# Patient Record
Sex: Female | Born: 1990 | Race: Black or African American | Hispanic: No | Marital: Married | Smoking: Current every day smoker
Health system: Southern US, Community
[De-identification: ages and names within clinical notes are randomized; demographics above are authoritative.]

## PROBLEM LIST (undated history)

## (undated) DIAGNOSIS — J45909 Unspecified asthma, uncomplicated: Secondary | ICD-10-CM

## (undated) DIAGNOSIS — F419 Anxiety disorder, unspecified: Secondary | ICD-10-CM

## (undated) DIAGNOSIS — F32A Depression, unspecified: Secondary | ICD-10-CM

## (undated) DIAGNOSIS — F319 Bipolar disorder, unspecified: Secondary | ICD-10-CM

## (undated) HISTORY — PX: NO PAST SURGERIES: SHX2092

## (undated) HISTORY — DX: Depression, unspecified: F32.A

---
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
## 2021-04-09 ENCOUNTER — Emergency Department (HOSPITAL_COMMUNITY): Payer: Medicaid - Out of State

## 2021-04-09 ENCOUNTER — Encounter (HOSPITAL_COMMUNITY): Payer: Self-pay | Admitting: *Deleted

## 2021-04-09 ENCOUNTER — Other Ambulatory Visit: Payer: Self-pay

## 2021-04-09 ENCOUNTER — Emergency Department (HOSPITAL_COMMUNITY)
Admission: EM | Admit: 2021-04-09 | Discharge: 2021-04-10 | Disposition: A | Discharge status: Home / Self Care | Payer: Medicaid - Out of State | Attending: Emergency Medicine | Admitting: Emergency Medicine

## 2021-04-09 DIAGNOSIS — M25561 Pain in right knee: Secondary | ICD-10-CM | POA: Diagnosis not present

## 2021-04-09 DIAGNOSIS — Y92002 Bathroom of unspecified non-institutional (private) residence single-family (private) house as the place of occurrence of the external cause: Secondary | ICD-10-CM | POA: Diagnosis not present

## 2021-04-09 DIAGNOSIS — W01198A Fall on same level from slipping, tripping and stumbling with subsequent striking against other object, initial encounter: Secondary | ICD-10-CM | POA: Insufficient documentation

## 2021-04-09 DIAGNOSIS — Y9302 Activity, running: Secondary | ICD-10-CM | POA: Insufficient documentation

## 2021-04-09 DIAGNOSIS — M25461 Effusion, right knee: Secondary | ICD-10-CM | POA: Diagnosis not present

## 2021-04-09 DIAGNOSIS — F17210 Nicotine dependence, cigarettes, uncomplicated: Secondary | ICD-10-CM | POA: Insufficient documentation

## 2021-04-09 DIAGNOSIS — J45909 Unspecified asthma, uncomplicated: Secondary | ICD-10-CM | POA: Insufficient documentation

## 2021-04-09 HISTORY — DX: Unspecified asthma, uncomplicated: J45.909

## 2021-04-09 NOTE — ED Provider Notes (Signed)
Emergency Medicine Provider Triage Evaluation Note  Cheryl Tran , a 30 y.o. female  was evaluated in triage.  Pt complains of knee pain.  Patient states that she was running to the bathroom when she tripped and fell awkwardly.  She felt a pop in her right knee.  She had almost immediate swelling and difficulty bearing weight.  She denies hip or ankle injury.  She did not hit her head or lose consciousness.  No treatments prior to arrival.  Review of Systems  Positive: Knee pain and swelling Negative: Numbness or tingling  Physical Exam  BP 115/79 (BP Location: Left Arm)    Pulse 98    Temp 99.3 F (37.4 C) (Oral)    Resp 16    SpO2 100%  Gen:   Awake, no distress   Resp:  Normal effort  MSK:   Mild swelling about the right knee, tenderness anteriorly and over the proximal tibia, patient is able to slightly extend the leg at the knee but reports significant pain Other:  Distal CMS intact  Medical Decision Making  Medically screening exam initiated at 9:53 PM.  Appropriate orders placed.  Cheryl Tran was informed that the remainder of the evaluation will be completed by another provider, this initial triage assessment does not replace that evaluation, and the importance of remaining in the ED until their evaluation is complete.     Renne Crigler, PA-C 04/09/21 2155    Sloan Leiter, DO 04/09/21 2226

## 2021-04-09 NOTE — ED Triage Notes (Signed)
Pt c/o R knee pain after falling tonight. Able to ambulate with increased pain

## 2021-04-10 NOTE — Discharge Instructions (Addendum)
You were seen in the ER today for your knee pain after your fall.  You did not have any broken bones but you do have some swelling in your knee; you may have strained the ligaments in the knee.  For this reason you have been given the support of knee brace and crutches to use.  You may begin to bear weight on your leg over the next few days as you feel comfortable.  Please call the orthopedic listed below if you for outpatient follow-up.  You may use Tylenol or ibuprofen as needed; you may also use topical pain relief such as Biofreeze or icy hot.  Please ice the area 15 to 20 minutes at a time a few times a day and elevate the knee when you are resting.  Return to the ER with any numbness, tingling, weakness in the leg, or any other new severe symptom.

## 2021-04-10 NOTE — ED Provider Notes (Signed)
[facility] EMERGENCY DEPARTMENT Provider Note   CSN: [id number] Arrival date & time: 04/09/21  2121     History Chief Complaint  Patient presents with   Knee Pain    Cheryl Tran is a 30 y.o. female who presents with concern for right knee pain after falling this evening at home.  She states that she was hurrying to the restroom when she tripped over the raised edge where 2 types of flooring meet in her hallway.  When this happened she fell forward landing directly on her knees on the hard floor.  She denies any numbness, ting, weakness in her leg since her fall.  She does endorse that her right knee feels like it buckles each time she tries to bear weight on it though she states this is contributed to by her pain.  I personally reviewed her medical records.  She does not carry any medical diagnoses besides asthma and is not on any medications daily.  She has recently relocated from [place].   HPI     Past Medical History:  Diagnosis Date   Asthma     There are no problems to display for this patient.   History reviewed. No pertinent surgical history.   OB History   No obstetric history on file.     No family history on file.  Social History   Tobacco Use   Smoking status: Every Day    Types: Cigarettes  Substance Use Topics   Alcohol use: Yes   Drug use: Yes    Types: Marijuana    Home Medications Prior to Admission medications   Not on File    Allergies    Patient has no known allergies.  Review of Systems   Review of Systems  Constitutional: Negative.   HENT: Negative.    Respiratory: Negative.    Cardiovascular: Negative.   Gastrointestinal: Negative.   Genitourinary: Negative.   Musculoskeletal:  Positive for joint swelling.       Right knee pain and swelling after fall  Skin: Negative.   Neurological: Negative.    Physical Exam Updated Vital Signs BP 130/89 (BP Location: Right Arm)    Pulse 89    Temp 99.3 F (37.4 C)  (Oral)    Resp 20    LMP 02/28/2021    SpO2 100%   Physical Exam Vitals and nursing note reviewed.  HENT:     Head: Normocephalic and atraumatic.  Eyes:     General: No scleral icterus.       Right eye: No discharge.        Left eye: No discharge.     Conjunctiva/sclera: Conjunctivae normal.  Cardiovascular:     Pulses:          Dorsalis pedis pulses are 2+ on the right side and 2+ on the left side.  Pulmonary:     Effort: Pulmonary effort is normal.  Musculoskeletal:     Right hip: Normal.     Left hip: Normal.     Right upper leg: Normal.     Left upper leg: Normal.     Right knee: Swelling and bony tenderness present. No deformity, ecchymosis or crepitus. Normal range of motion. Tenderness present over the lateral joint line. No LCL laxity, MCL laxity, ACL laxity or PCL laxity. Normal alignment.     Left knee: Normal.     Right lower leg: Normal.     Left lower leg: Normal.     Right  ankle: Normal.     Right Achilles Tendon: Normal.     Left ankle: Normal.     Left Achilles Tendon: Normal.     Right foot: Normal.     Left foot: Normal.  Skin:    General: Skin is warm and dry.     Capillary Refill: Capillary refill takes less than 2 seconds.  Neurological:     General: No focal deficit present.     Mental Status: She is alert.  Psychiatric:        Mood and Affect: Mood normal.    ED Results / Procedures / Treatments   Labs (all labs ordered are listed, but only abnormal results are displayed) Labs Reviewed - No data to display  EKG None  Radiology DG Knee Complete 4 Views Right  Result Date: 04/09/2021 CLINICAL DATA:  Status post fall. EXAM: RIGHT KNEE - COMPLETE 4+ VIEW COMPARISON:  None. FINDINGS: No evidence of an acute fracture or dislocation. No evidence of arthropathy or other focal bone abnormality. A moderate sized joint effusion is noted. IMPRESSION: 1. No acute fracture or dislocation. 2. Moderate sized joint effusion. Electronically Signed   By:  Aram Candela M.D.   On: 04/09/2021 22:48    Procedures Procedures   Medications Ordered in ED Medications - No data to display  ED Course  I have reviewed the triage vital signs and the nursing notes.  Pertinent labs & imaging results that were available during my care of the patient were reviewed by me and considered in my medical decision making (see chart for details).    MDM Rules/Calculators/A&P                         30 year old female presents with concern for knee pain after mechanical fall this evening.  Diagnosis includes was not limited to acute fracture or dislocation, effusion, soft tissue injury, ligamentous injury.  Medicines are normal in intake.  There is no laxity of the ACL/PCL/MCL/LCL on exam the patient does have tenderness palpation over the lateral joint line and some discomfort with valgus movement of the knee.  Normal distal pulses in bilateral lower extremities.  Will place patient in a supportive knee brace and provide crutches.  She may be weightbearing as tolerated until she follows up with orthopedic in the outpatient setting.  No further work-up warranted in the ER at this time.  Toya voiced understanding of her medical evaluation and treatment plan.  Each of her questions was answered to her expressed satisfaction.  Return precautions were given.  Patient is well-appearing, stable, and appropriate for discharge at this time.  This chart was dictated using voice recognition software, Dragon. Despite the best efforts of this provider to proofread and correct errors, errors may still occur which can change documentation meaning.  Final Clinical Impression(s) / ED Diagnoses Final diagnoses:  Acute pain of right knee    Rx / DC Orders ED Discharge Orders     None        Sherrilee Gilles 04/10/21 0319    Dione Booze, MD 04/10/21 [phone]

## 2021-05-29 ENCOUNTER — Other Ambulatory Visit: Payer: Self-pay

## 2021-05-29 ENCOUNTER — Ambulatory Visit (HOSPITAL_BASED_OUTPATIENT_CLINIC_OR_DEPARTMENT_OTHER): Payer: Medicaid - Out of State | Admitting: Orthopaedic Surgery

## 2021-05-29 ENCOUNTER — Telehealth: Payer: Self-pay | Admitting: Orthopaedic Surgery

## 2021-05-29 NOTE — Telephone Encounter (Signed)
Cheryl Tran came into [facility] for ortho appointment. I informed her we did not take the Medicaid-Family planning plan she was currently enrolled in. She became upset because she was told we accepted medicaid when making the appointment. She asked which medicaid plan we accepted and I told her we accept multiple medicaid plans just not out-of-state or family planning. I instructed her to call DSS and inform her she needed to enroll in a different medicaid plan but she did not want to do that. She was extremely rude and loud. Security was called and arrived to escort her out the building at the same time she was heading to the elevator to leave on her own.

## 2022-04-07 ENCOUNTER — Encounter (HOSPITAL_COMMUNITY): Payer: Self-pay | Admitting: Psychiatry

## 2022-04-07 ENCOUNTER — Other Ambulatory Visit (HOSPITAL_COMMUNITY): Payer: Self-pay

## 2022-04-07 ENCOUNTER — Ambulatory Visit (INDEPENDENT_AMBULATORY_CARE_PROVIDER_SITE_OTHER): Payer: Medicaid Other | Admitting: Psychiatry

## 2022-04-07 ENCOUNTER — Other Ambulatory Visit: Payer: Self-pay

## 2022-04-07 VITALS — BP 154/99 | HR 62 | Temp 98.6°F | Wt 205.8 lb

## 2022-04-07 DIAGNOSIS — F411 Generalized anxiety disorder: Secondary | ICD-10-CM

## 2022-04-07 DIAGNOSIS — F3162 Bipolar disorder, current episode mixed, moderate: Secondary | ICD-10-CM

## 2022-04-07 DIAGNOSIS — Z0000 Encounter for general adult medical examination without abnormal findings: Secondary | ICD-10-CM

## 2022-04-07 DIAGNOSIS — F4310 Post-traumatic stress disorder, unspecified: Secondary | ICD-10-CM

## 2022-04-07 MED ORDER — GABAPENTIN 300 MG PO CAPS
300.0000 mg | ORAL_CAPSULE | Freq: Three times a day (TID) | ORAL | 3 refills | Status: DC
  Filled 2022-04-07: qty 90, 30d supply, fill #0

## 2022-04-07 MED ORDER — QUETIAPINE FUMARATE 50 MG PO TABS
50.0000 mg | ORAL_TABLET | Freq: Every day | ORAL | 3 refills | Status: DC
  Filled 2022-04-07: qty 30, 30d supply, fill #0

## 2022-04-07 MED ORDER — PRAZOSIN HCL 1 MG PO CAPS
1.0000 mg | ORAL_CAPSULE | Freq: Every day | ORAL | 3 refills | Status: DC
  Filled 2022-04-07 (×2): qty 30, 30d supply, fill #0
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
## 2022-04-22 ENCOUNTER — Ambulatory Visit: Payer: Medicaid Other | Admitting: Family

## 2022-04-24 ENCOUNTER — Other Ambulatory Visit: Payer: Self-pay

## 2022-04-24 ENCOUNTER — Ambulatory Visit: Payer: Medicaid Other | Admitting: Nurse Practitioner

## 2022-04-24 ENCOUNTER — Encounter: Payer: Self-pay | Admitting: Nurse Practitioner

## 2022-04-24 VITALS — BP 120/80 | HR 84 | Ht 68.5 in | Wt 197.0 lb

## 2022-04-24 DIAGNOSIS — Z833 Family history of diabetes mellitus: Secondary | ICD-10-CM | POA: Diagnosis not present

## 2022-04-24 DIAGNOSIS — Z3169 Encounter for other general counseling and advice on procreation: Secondary | ICD-10-CM

## 2022-04-24 DIAGNOSIS — Z8349 Family history of other endocrine, nutritional and metabolic diseases: Secondary | ICD-10-CM | POA: Diagnosis not present

## 2022-04-24 DIAGNOSIS — E282 Polycystic ovarian syndrome: Secondary | ICD-10-CM | POA: Diagnosis not present

## 2022-04-24 DIAGNOSIS — Z01419 Encounter for gynecological examination (general) (routine) without abnormal findings: Secondary | ICD-10-CM

## 2022-04-24 DIAGNOSIS — Z113 Encounter for screening for infections with a predominantly sexual mode of transmission: Secondary | ICD-10-CM

## 2022-04-24 MED ORDER — METFORMIN HCL 500 MG PO TABS
500.0000 mg | ORAL_TABLET | Freq: Two times a day (BID) | ORAL | 1 refills | Status: DC
Start: 2022-04-24 — End: 2023-02-18
  Filled 2022-04-24: qty 180, 90d supply, fill #0
  Filled 2022-09-18: qty 180, 90d supply, fill #1

## 2022-04-24 NOTE — Progress Notes (Signed)
Cheryl Tran 1990-09-20 [id number]   History:  32 y.o. G3P0030 presents as new patient to establish care and discuss trying to conceive. H/O PCOS. Cycles have been very irregular in the past, sometimes going 3 months without. The last few years they have been regular. She has been trying to get pregnant for 5 years. Current partner has children. She has tracked cycles and used OPKs. Saw endocrinology but then moved so she never started treatment. 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 Last pregnancy a couple of years ago. Has hirsutism. Prescribed gabapentin, minipress and seroquel recently for depression but did not start them. She has started therapy. Normal pap history.   Gynecologic History Patient's last menstrual period was 04/09/2022. Period Pattern: (!) Irregular Menstrual Flow: Light Menstrual Control: Thin pad Dysmenorrhea: None Contraception/Family planning: none Sexually active: Yes  Health Maintenance Last Pap: 09/2020. Results were: Normal per patient Last mammogram: Not indicated Last colonoscopy: Not indicated Last Dexa: Not indicated   Past medical history, past surgical history, family history and social history were all reviewed and documented in the EPIC chart. From [place]. Boyfriend of 3 years. Sociology and psychology degree, cosmetology degree. Considering [employer]. Father has diabetes, mother and sister with history of graves disease.   ROS:  A ROS was performed and pertinent positives and negatives are included.  Exam:  Vitals:   04/24/22 1052  BP: 120/80  Pulse: 84  Weight: 197 lb (89.4 kg)  Height: 5' 8.5" (1.74 m)  PF: 97 L/min   Body mass index is 29.52 kg/m.  General appearance:  Normal Thyroid:  Symmetrical, normal in size, without palpable masses or nodularity. Respiratory  Auscultation:  Clear without wheezing or rhonchi Cardiovascular  Auscultation:  Regular rate, without rubs, murmurs or gallops  Edema/varicosities:  Not grossly  evident Abdominal  Soft,nontender, without masses, guarding or rebound.  Liver/spleen:  No organomegaly noted  Hernia:  None appreciated  Skin  Inspection:  Grossly normal Breasts: Examined lying and sitting.   Right: Without masses, retractions, nipple discharge or axillary adenopathy.   Left: Without masses, retractions, nipple discharge or axillary adenopathy. Genitourinary   Inguinal/mons:  Normal without inguinal adenopathy  External genitalia:  Normal appearing vulva with no masses, tenderness, or lesions  BUS/Urethra/Skene's glands:  Normal  Vagina:  Normal appearing with normal color and discharge, no lesions  Cervix:  Normal appearing without discharge or lesions  Uterus:  Normal in size, shape and contour.  Midline and mobile, nontender  Adnexa/parametria:     Rt: Normal in size, without masses or tenderness.   Lt: Normal in size, without masses or tenderness.  Anus and perineum: Normal  Digital rectal exam: Not indicated  Patient informed chaperone available to be present for breast and pelvic exam. Patient has requested no chaperone to be present. Patient has been advised what will be completed during breast and pelvic exam.   Assessment/Plan:  32 y.o. G3P0030 to establish care and discuss PCOS and trying to conceive.   Well female exam with routine gynecological exam - Education provided on SBEs, importance of preventative screenings, current guidelines, high calcium diet, regular exercise, and multivitamin daily.   Encounter for preconception consultation - Start PNV. Continue tracking cycles and ovulation window, OPKs, female hormone panel today, will return for 21-day progesterone, Metformin BID, healthy lifestyle.   PCOS (polycystic ovarian syndrome) - Plan: Estradiol, Follicle stimulating hormone, Luteinizing hormone, Progesterone, Testos,Total,Free and SHBG (Female), metFORMIN (GLUCOPHAGE) 500 MG tablet BID with meals. Aware of initial GI side  effects. Will increase  to twice daily as tolerated. Will return for 21-day progesterone. Cycles are regular now. Hirsutism.   Family history of diabetes mellitus - Plan: Hemoglobin A1c. Father.   Screening examination for STD (sexually transmitted disease) - Plan: RPR, HIV Antibody (routine testing w rflx), C. trachomatis/N. gonorrhoeae RNA  Family history of thyroid disease - Plan: TSH. Mother and sister with graves disease.   Screening for cervical cancer - Normal Pap history.  Will repeat at 3-year interval per guidelines.  Return in 1 year for annual.     Olivia Mackie DNP, 12:00 PM 04/24/2022

## 2022-04-25 LAB — PROGESTERONE: Progesterone: 0.6 ng/mL

## 2022-04-25 LAB — C. TRACHOMATIS/N. GONORRHOEAE RNA
C. trachomatis RNA, TMA: NOT DETECTED
N. gonorrhoeae RNA, TMA: NOT DETECTED

## 2022-04-28 ENCOUNTER — Other Ambulatory Visit: Payer: Self-pay | Admitting: Nurse Practitioner

## 2022-04-28 DIAGNOSIS — Z789 Other specified health status: Secondary | ICD-10-CM

## 2022-04-28 LAB — HEMOGLOBIN A1C
Hgb A1c MFr Bld: 5.4 % of total Hgb (ref <5.7)
Mean Plasma Glucose: 108 mg/dL
eAG (mmol/L): 6 mmol/L

## 2022-04-28 LAB — LUTEINIZING HORMONE: LH: 22.4 m[IU]/mL

## 2022-04-28 LAB — TESTOS,TOTAL,FREE AND SHBG (FEMALE)
Free Testosterone: 8.1 pg/mL — ABNORMAL HIGH (ref 0.1–6.4)
Sex Hormone Binding: 26.6 nmol/L (ref 17–124)
Testosterone, Total, LC-MS-MS: 51 ng/dL — ABNORMAL HIGH (ref 2–45)

## 2022-04-28 LAB — ESTRADIOL: Estradiol: 80 pg/mL

## 2022-04-28 LAB — HIV ANTIBODY (ROUTINE TESTING W REFLEX): HIV 1&2 Ab, 4th Generation: NONREACTIVE

## 2022-04-28 LAB — TSH: TSH: 1.6 mIU/L

## 2022-04-28 LAB — FOLLICLE STIMULATING HORMONE: FSH: 10.6 m[IU]/mL

## 2022-04-28 LAB — RPR: RPR Ser Ql: NONREACTIVE

## 2022-04-29 ENCOUNTER — Other Ambulatory Visit: Payer: Self-pay

## 2022-04-29 ENCOUNTER — Ambulatory Visit: Payer: Medicaid Other | Admitting: Family

## 2022-04-29 ENCOUNTER — Other Ambulatory Visit: Payer: Medicaid Other

## 2022-04-29 ENCOUNTER — Encounter: Payer: Self-pay | Admitting: Family

## 2022-04-29 VITALS — BP 118/64 | HR 74 | Temp 97.1°F | Ht 68.5 in | Wt 208.1 lb

## 2022-04-29 DIAGNOSIS — J4540 Moderate persistent asthma, uncomplicated: Secondary | ICD-10-CM

## 2022-04-29 DIAGNOSIS — E282 Polycystic ovarian syndrome: Secondary | ICD-10-CM | POA: Insufficient documentation

## 2022-04-29 DIAGNOSIS — Z789 Other specified health status: Secondary | ICD-10-CM | POA: Diagnosis not present

## 2022-04-29 MED ORDER — ALBUTEROL SULFATE (2.5 MG/3ML) 0.083% IN NEBU
2.5000 mg | INHALATION_SOLUTION | Freq: Four times a day (QID) | RESPIRATORY_TRACT | 5 refills | Status: AC | PRN
  Filled 2022-04-29: qty 90, 8d supply, fill #0

## 2022-04-29 MED ORDER — SYMBICORT 80-4.5 MCG/ACT IN AERO
2.0000 | INHALATION_SPRAY | Freq: Two times a day (BID) | RESPIRATORY_TRACT | 5 refills | Status: AC
  Filled 2022-04-29: qty 10.2, 30d supply, fill #0

## 2022-04-29 MED ORDER — MONTELUKAST SODIUM 5 MG PO CHEW
5.0000 mg | CHEWABLE_TABLET | Freq: Every day | ORAL | 5 refills | Status: DC
  Filled 2022-04-29 – 2022-07-10 (×2): qty 30, 30d supply, fill #0

## 2022-04-29 MED ORDER — ALBUTEROL SULFATE HFA 108 (90 BASE) MCG/ACT IN AERS
2.0000 | INHALATION_SPRAY | Freq: Four times a day (QID) | RESPIRATORY_TRACT | 5 refills | Status: DC | PRN
  Filled 2022-04-29: qty 18, 25d supply, fill #0
  Filled 2022-06-30: qty 18, 30d supply, fill #0

## 2022-04-29 NOTE — Progress Notes (Signed)
New Patient Office Visit  Subjective:  Patient ID: Cheryl Tran, female    DOB: [birth year]  Age: 32 y.o. MRN: [id number]  CC:  Chief Complaint  Patient presents with   New Patient (Initial Visit)   Asthma    Discuss ventolin and albuterol for nebulizer treatment.     HPI Cheryl Tran presents for establishing care today.  Asthma: pt has had for years, used to have a lot of exacerbations, lots of prednisone, hx of Singulair, has been here in [place] for little over a year, states only environmental allergens, still smokes cigars (black&milds)  managed with Symbicort prn & rescue inhaler. Has had a recent flare as she ran out of meds and had no PCP.  Seen in [place] and took pred & zpack and felt better. States she is just using a nebulizer currently.  Assessment & Plan:   Problem List Items Addressed This Visit       Respiratory   Asthma - Primary    chronic long hx of difficult to control sx, reports gaining a lot of wt on prednisone, singulair, advair, symbicort, nebulizer, albuterol environmental allergens are her main trigger, despite smoking 2 cigars/d this does not aggravate her sx (advised on other ill effects to health. sending refills for Albuterol in nebulizer & HHI, Symbicort, and Singulair, advised on proper use & SE of all f/u 6 mos or prn      Relevant Medications   montelukast (SINGULAIR) 5 MG chewable tablet   SYMBICORT 80-4.5 MCG/ACT inhaler   albuterol (VENTOLIN HFA) 108 (90 Base) MCG/ACT inhaler   albuterol (PROVENTIL) (2.5 MG/3ML) 0.083% nebulizer solution    Problem List Items Addressed This Visit       Respiratory   Asthma - Primary    chronic long hx of difficult to control sx, reports gaining a lot of wt on prednisone, singulair, advair, symbicort, nebulizer, albuterol environmental allergens are her main trigger, despite smoking 2 cigars/d this does not aggravate her sx (advised on other ill effects to health. sending refills for Albuterol in nebulizer & HHI,  Symbicort, and Singulair, advised on proper use & SE of all f/u 6 mos or prn      Relevant Medications   montelukast (SINGULAIR) 5 MG chewable tablet   SYMBICORT 80-4.5 MCG/ACT inhaler   albuterol (VENTOLIN HFA) 108 (90 Base) MCG/ACT inhaler   albuterol (PROVENTIL) (2.5 MG/3ML) 0.083% nebulizer solution    Subjective:    Outpatient Medications Prior to Visit  Medication Sig Dispense Refill   metFORMIN (GLUCOPHAGE) 500 MG tablet Take 1 tablet (500 mg total) by mouth 2 (two) times daily with a meal. 180 tablet 1   SYMBICORT 80-4.5 MCG/ACT inhaler 2 puff using inhaler twice a day for 30 day - (Patient not taking: Reported on 04/29/2022)     No facility-administered medications prior to visit.   Past Medical History:  Diagnosis Date   Asthma    Depression    No past surgical history on file.  Objective:   Today's Vitals: BP 118/64 (BP Location: Left Arm, Patient Position: Sitting, Cuff Size: Large)   Pulse 74   Temp (!) 97.1 F (36.2 C) (Temporal)   Ht 5' 8.5" (1.74 m)   Wt 208 lb 2 oz (94.4 kg)   LMP 04/09/2022 (Exact Date)   SpO2 98%   BMI 31.19 kg/m   Physical Exam Vitals and nursing note reviewed.  Constitutional:      Appearance: Normal appearance. She is obese.  Cardiovascular:  Rate and Rhythm: Normal rate and regular rhythm.  Pulmonary:     Effort: Pulmonary effort is normal.     Breath sounds: Normal breath sounds.  Musculoskeletal:        General: Normal range of motion.  Skin:    General: Skin is warm and dry.  Neurological:     Mental Status: She is alert.  Psychiatric:        Mood and Affect: Mood normal.        Behavior: Behavior normal.     Meds ordered this encounter  Medications   montelukast (SINGULAIR) 5 MG chewable tablet    Sig: Chew 1 tablet (5 mg total) by mouth at bedtime.    Dispense:  30 tablet    Refill:  5    Order Specific Question:   Supervising Provider    Answer:   ANDY, CAMILLE L [[id number]]   SYMBICORT 80-4.5 MCG/ACT  inhaler    Sig: Inhale 2 puffs into the lungs 2 (two) times daily.    Dispense:  10.2 g    Refill:  5    Order Specific Question:   Supervising Provider    Answer:   ANDY, CAMILLE L [[id number]]   albuterol (VENTOLIN HFA) 108 (90 Base) MCG/ACT inhaler    Sig: Inhale 2 puffs into the lungs every 6 (six) hours as needed for wheezing or shortness of breath.    Dispense:  18 g    Refill:  5    Order Specific Question:   Supervising Provider    Answer:   ANDY, CAMILLE L [[id number]]   albuterol (PROVENTIL) (2.5 MG/3ML) 0.083% nebulizer solution    Sig: Take 3 mLs (2.5 mg total) by nebulization every 6 (six) hours as needed for wheezing or shortness of breath.    Dispense:  90 mL    Refill:  5    Order Specific Question:   Supervising Provider    Answer:   ANDY, CAMILLE L [[id number]]    Dulce Sellar, NP

## 2022-04-29 NOTE — Patient Instructions (Signed)
Welcome to [facility], It was a pleasure meeting you today!    As discussed, I have sent your refills to your pharmacy.   Please schedule a 6 month follow up visit today for refills.    PLEASE NOTE: If you had any LAB tests please let us know if you have not heard back within a few days. You may see your results on MyChart before we have a chance to review them but we will give you a call once they are reviewed by [place]. If we ordered any REFERRALS today, please let us know if you have not heard from their office within the next week.  Let us know through MyChart if you are needing REFILLS, or have your pharmacy send [place] the request. You can also use MyChart to communicate with me or any office staff.  Please try these tips to maintain a healthy lifestyle: It is important that you exercise regularly at least 30 minutes 5 times a week. Think about what you will eat, plan ahead. Choose whole foods, & think  "clean, green, fresh or frozen" over canned, processed or packaged foods which are more sugary, salty, and fatty. 70 to 75% of food eaten should be fresh vegetables and protein. 2-3  meals daily with healthy snacks between meals, but must be whole fruit, protein or vegetables. Aim to eat over a 10 hour period when you are active, for example, 7am to 5pm, and then STOP after your last meal of the day, drinking only water.  Shorter eating windows, 6-8 hours, are showing benefits in heart disease and blood sugar regulation. Drink water every day! Shoot for 64 ounces daily = 8 cups, no other drink is as healthy! Fruit juice is best enjoyed in a healthy way, by EATING the fruit.

## 2022-04-29 NOTE — Assessment & Plan Note (Addendum)
chronic long hx of difficult to control sx, reports gaining a lot of wt on prednisone, singulair, advair, symbicort, nebulizer, albuterol environmental allergens are her main trigger, despite smoking 2 cigars/d this does not aggravate her sx (advised on other ill effects to health. sending refills for Albuterol in nebulizer & HHI, Symbicort, and Singulair, advised on proper use & SE of all f/u 6 mos or prn

## 2022-04-30 LAB — PROGESTERONE: Progesterone: 1.2 ng/mL

## 2022-05-04 ENCOUNTER — Ambulatory Visit (HOSPITAL_COMMUNITY): Payer: Medicaid Other | Admitting: Clinical

## 2022-05-06 ENCOUNTER — Ambulatory Visit (HOSPITAL_BASED_OUTPATIENT_CLINIC_OR_DEPARTMENT_OTHER): Payer: Medicaid Other | Admitting: Orthopaedic Surgery

## 2022-05-06 ENCOUNTER — Other Ambulatory Visit: Payer: Self-pay

## 2022-05-12 ENCOUNTER — Encounter (HOSPITAL_COMMUNITY): Payer: Self-pay

## 2022-05-12 ENCOUNTER — Ambulatory Visit (INDEPENDENT_AMBULATORY_CARE_PROVIDER_SITE_OTHER): Payer: Medicaid Other | Admitting: Mental Health

## 2022-05-12 DIAGNOSIS — F4310 Post-traumatic stress disorder, unspecified: Secondary | ICD-10-CM

## 2022-05-12 DIAGNOSIS — F3162 Bipolar disorder, current episode mixed, moderate: Secondary | ICD-10-CM | POA: Diagnosis not present

## 2022-05-12 DIAGNOSIS — F411 Generalized anxiety disorder: Secondary | ICD-10-CM | POA: Insufficient documentation

## 2022-05-12 HISTORY — DX: Generalized anxiety disorder: F41.1
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
## 2022-05-20 ENCOUNTER — Ambulatory Visit (INDEPENDENT_AMBULATORY_CARE_PROVIDER_SITE_OTHER): Payer: Medicaid Other | Admitting: Orthopaedic Surgery

## 2022-05-20 DIAGNOSIS — M2391 Unspecified internal derangement of right knee: Secondary | ICD-10-CM | POA: Diagnosis not present
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
## 2022-05-26 ENCOUNTER — Telehealth (HOSPITAL_COMMUNITY): Payer: Self-pay | Admitting: Psychiatry

## 2022-05-26 NOTE — Telephone Encounter (Signed)
Patient notes that her mental health is improving but was recently told to discontinue her psychiatric medications because she is attempting to get pregnant. Provider recommended patient continue her psychiatric medications and inform writer when she becomes pregnant. Provider informed patient of the risk and benefits of psychiatric medications while pregnant. Patient told that medications could be reduced or discontinued when she becomes pregnant. She endorsed understanding and agreed. No other concerns noted at this time.

## 2022-05-27 ENCOUNTER — Other Ambulatory Visit: Payer: Medicaid Other

## 2022-05-29 ENCOUNTER — Ambulatory Visit
Admission: RE | Admit: 2022-05-29 | Discharge: 2022-05-29 | Disposition: A | Discharge status: Home / Self Care | Payer: Medicaid Other | Source: Ambulatory Visit | Attending: Orthopaedic Surgery | Admitting: Orthopaedic Surgery

## 2022-05-29 DIAGNOSIS — M25461 Effusion, right knee: Secondary | ICD-10-CM | POA: Diagnosis not present

## 2022-05-29 DIAGNOSIS — M2391 Unspecified internal derangement of right knee: Secondary | ICD-10-CM

## 2022-06-09 ENCOUNTER — Ambulatory Visit (INDEPENDENT_AMBULATORY_CARE_PROVIDER_SITE_OTHER): Payer: Medicaid Other | Admitting: Mental Health

## 2022-06-09 DIAGNOSIS — F3162 Bipolar disorder, current episode mixed, moderate: Secondary | ICD-10-CM | POA: Diagnosis not present

## 2022-06-09 DIAGNOSIS — F411 Generalized anxiety disorder: Secondary | ICD-10-CM

## 2022-06-09 DIAGNOSIS — F4310 Post-traumatic stress disorder, unspecified: Secondary | ICD-10-CM
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
## 2022-06-10 ENCOUNTER — Ambulatory Visit (INDEPENDENT_AMBULATORY_CARE_PROVIDER_SITE_OTHER): Payer: Medicaid Other | Admitting: Orthopaedic Surgery

## 2022-06-10 ENCOUNTER — Other Ambulatory Visit (HOSPITAL_BASED_OUTPATIENT_CLINIC_OR_DEPARTMENT_OTHER): Payer: Self-pay

## 2022-06-10 ENCOUNTER — Ambulatory Visit (HOSPITAL_BASED_OUTPATIENT_CLINIC_OR_DEPARTMENT_OTHER): Payer: Self-pay | Admitting: Orthopaedic Surgery

## 2022-06-10 DIAGNOSIS — M2341 Loose body in knee, right knee: Secondary | ICD-10-CM

## 2022-06-10 MED ORDER — IBUPROFEN 800 MG PO TABS
800.0000 mg | ORAL_TABLET | Freq: Three times a day (TID) | ORAL | 0 refills | Status: AC
  Filled 2022-06-10 – 2022-06-20 (×2): qty 30, 10d supply, fill #0

## 2022-06-10 MED ORDER — OXYCODONE HCL 5 MG PO TABS
5.0000 mg | ORAL_TABLET | ORAL | 0 refills | Status: DC | PRN
  Filled 2022-06-10 – 2022-06-20 (×2): qty 5, 1d supply, fill #0

## 2022-06-10 MED ORDER — ASPIRIN 325 MG PO TBEC
325.0000 mg | DELAYED_RELEASE_TABLET | Freq: Every day | ORAL | 0 refills | Status: AC
  Filled 2022-06-10: qty 14, 14d supply, fill #0

## 2022-06-10 MED ORDER — ACETAMINOPHEN 500 MG PO TABS
500.0000 mg | ORAL_TABLET | Freq: Three times a day (TID) | ORAL | 0 refills | Status: AC
  Filled 2022-06-10: qty 30, 10d supply, fill #0
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
## 2022-06-11 ENCOUNTER — Telehealth: Payer: Self-pay | Admitting: Orthopaedic Surgery

## 2022-06-11 NOTE — Telephone Encounter (Signed)
Patient request a call back to advise how long she will be out of work post this first surgery.

## 2022-06-11 NOTE — Telephone Encounter (Signed)
Returned call to patient and informed her she likely will be OOW for 2 weeks following surgery. Follow up question was asked  asking how long recovery would be around the house. I told her she would have crutches to utilize around the house. No further questions asked

## 2022-06-15 ENCOUNTER — Encounter (HOSPITAL_BASED_OUTPATIENT_CLINIC_OR_DEPARTMENT_OTHER): Payer: Self-pay | Admitting: Orthopaedic Surgery

## 2022-06-15 ENCOUNTER — Other Ambulatory Visit: Payer: Self-pay

## 2022-06-17 ENCOUNTER — Ambulatory Visit (HOSPITAL_BASED_OUTPATIENT_CLINIC_OR_DEPARTMENT_OTHER): Payer: Medicaid Other | Admitting: Orthopaedic Surgery

## 2022-06-17 ENCOUNTER — Other Ambulatory Visit (HOSPITAL_BASED_OUTPATIENT_CLINIC_OR_DEPARTMENT_OTHER): Payer: Self-pay

## 2022-06-19 ENCOUNTER — Encounter (HOSPITAL_BASED_OUTPATIENT_CLINIC_OR_DEPARTMENT_OTHER)
Admission: RE | Admit: 2022-06-19 | Discharge: 2022-06-19 | Disposition: A | Discharge status: Home / Self Care | Payer: Medicaid Other | Source: Ambulatory Visit | Attending: Orthopaedic Surgery | Admitting: Orthopaedic Surgery

## 2022-06-19 DIAGNOSIS — M2341 Loose body in knee, right knee: Secondary | ICD-10-CM | POA: Diagnosis not present

## 2022-06-19 DIAGNOSIS — Z01812 Encounter for preprocedural laboratory examination: Secondary | ICD-10-CM | POA: Insufficient documentation

## 2022-06-19 LAB — BASIC METABOLIC PANEL
Anion gap: 10 (ref 5–15)
BUN: 7 mg/dL (ref 6–20)
CO2: 23 mmol/L (ref 22–32)
Calcium: 8 mg/dL — ABNORMAL LOW (ref 8.9–10.3)
Chloride: 106 mmol/L (ref 98–111)
Creatinine, Ser: 0.76 mg/dL (ref 0.44–1.00)
GFR, Estimated: >60 mL/min (ref >60)
Glucose, Bld: 84 mg/dL (ref 70–99)
Potassium: 4.3 mmol/L (ref 3.5–5.1)
Sodium: 139 mmol/L (ref 135–145)

## 2022-06-19 NOTE — Progress Notes (Signed)

## 2022-06-20 ENCOUNTER — Other Ambulatory Visit (HOSPITAL_BASED_OUTPATIENT_CLINIC_OR_DEPARTMENT_OTHER): Payer: Self-pay

## 2022-06-22 ENCOUNTER — Ambulatory Visit (HOSPITAL_BASED_OUTPATIENT_CLINIC_OR_DEPARTMENT_OTHER)
Admission: RE | Admit: 2022-06-22 | Discharge: 2022-06-22 | Disposition: A | Discharge status: Home / Self Care | Payer: Medicaid Other | Attending: Orthopaedic Surgery | Admitting: Orthopaedic Surgery

## 2022-06-22 ENCOUNTER — Ambulatory Visit (HOSPITAL_BASED_OUTPATIENT_CLINIC_OR_DEPARTMENT_OTHER): Payer: Medicaid Other | Admitting: Anesthesiology

## 2022-06-22 ENCOUNTER — Encounter (HOSPITAL_BASED_OUTPATIENT_CLINIC_OR_DEPARTMENT_OTHER)
Admission: RE | Disposition: A | Discharge status: Home / Self Care | Payer: Self-pay | Source: Home / Self Care | Attending: Orthopaedic Surgery

## 2022-06-22 ENCOUNTER — Encounter (HOSPITAL_BASED_OUTPATIENT_CLINIC_OR_DEPARTMENT_OTHER): Payer: Self-pay | Admitting: Orthopaedic Surgery

## 2022-06-22 DIAGNOSIS — G8918 Other acute postprocedural pain: Secondary | ICD-10-CM | POA: Diagnosis not present

## 2022-06-22 DIAGNOSIS — X58XXXA Exposure to other specified factors, initial encounter: Secondary | ICD-10-CM | POA: Diagnosis not present

## 2022-06-22 DIAGNOSIS — F17210 Nicotine dependence, cigarettes, uncomplicated: Secondary | ICD-10-CM | POA: Insufficient documentation

## 2022-06-22 DIAGNOSIS — M2241 Chondromalacia patellae, right knee: Secondary | ICD-10-CM | POA: Diagnosis not present

## 2022-06-22 DIAGNOSIS — M2391 Unspecified internal derangement of right knee: Secondary | ICD-10-CM | POA: Insufficient documentation

## 2022-06-22 DIAGNOSIS — M2341 Loose body in knee, right knee: Secondary | ICD-10-CM | POA: Diagnosis not present

## 2022-06-22 DIAGNOSIS — J45909 Unspecified asthma, uncomplicated: Secondary | ICD-10-CM | POA: Insufficient documentation

## 2022-06-22 DIAGNOSIS — Z01818 Encounter for other preprocedural examination: Secondary | ICD-10-CM

## 2022-06-22 DIAGNOSIS — S83511D Sprain of anterior cruciate ligament of right knee, subsequent encounter: Secondary | ICD-10-CM | POA: Diagnosis not present

## 2022-06-22 DIAGNOSIS — S83511A Sprain of anterior cruciate ligament of right knee, initial encounter: Secondary | ICD-10-CM | POA: Diagnosis not present

## 2022-06-22 HISTORY — PX: KNEE ARTHROSCOPY WITH MACI CARTILAGE HARVEST: SHX6875

## 2022-06-22 HISTORY — DX: Bipolar disorder, unspecified: F31.9

## 2022-06-22 LAB — POCT PREGNANCY, URINE: Preg Test, Ur: NEGATIVE

## 2022-06-22 SURGERY — KNEE ARTHROSCOPY WITH MACI CARTILAGE HARVEST
Anesthesia: Regional | Site: Knee | Laterality: Right

## 2022-06-22 MED ORDER — ACETAMINOPHEN 500 MG PO TABS: 1000.0000 mg | ORAL_TABLET | Freq: Once | ORAL | Status: AC

## 2022-06-22 MED ORDER — TRANEXAMIC ACID-NACL 1000-0.7 MG/100ML-% IV SOLN
INTRAVENOUS | Status: AC
  Filled 2022-06-22: qty 100

## 2022-06-22 MED ORDER — FENTANYL CITRATE (PF) 100 MCG/2ML IJ SOLN: 25.0000 ug | INTRAMUSCULAR | Status: DC | PRN

## 2022-06-22 MED ORDER — DEXAMETHASONE SODIUM PHOSPHATE 4 MG/ML IJ SOLN
INTRAMUSCULAR | Status: DC | PRN
  Administered 2022-06-22: 10 mg via INTRAVENOUS

## 2022-06-22 MED ORDER — ONDANSETRON HCL 4 MG/2ML IJ SOLN
INTRAMUSCULAR | Status: DC | PRN
  Administered 2022-06-22: 4 mg via INTRAVENOUS

## 2022-06-22 MED ORDER — ACETAMINOPHEN 325 MG PO TABS: 325.0000 mg | ORAL_TABLET | ORAL | Status: DC | PRN

## 2022-06-22 MED ORDER — ONDANSETRON HCL 4 MG/2ML IJ SOLN
INTRAMUSCULAR | Status: AC
  Filled 2022-06-22: qty 2

## 2022-06-22 MED ORDER — LIDOCAINE 2% (20 MG/ML) 5 ML SYRINGE
INTRAMUSCULAR | Status: AC
  Filled 2022-06-22: qty 5

## 2022-06-22 MED ORDER — ACETAMINOPHEN 500 MG PO TABS
1000.0000 mg | ORAL_TABLET | Freq: Once | ORAL | Status: AC
  Administered 2022-06-22: 1000 mg via ORAL

## 2022-06-22 MED ORDER — LIDOCAINE HCL (CARDIAC) PF 100 MG/5ML IV SOSY
PREFILLED_SYRINGE | INTRAVENOUS | Status: DC | PRN
  Administered 2022-06-22: 50 mg via INTRAVENOUS

## 2022-06-22 MED ORDER — FENTANYL CITRATE (PF) 100 MCG/2ML IJ SOLN
INTRAMUSCULAR | Status: AC
  Filled 2022-06-22: qty 2

## 2022-06-22 MED ORDER — SODIUM CHLORIDE 0.9 % IR SOLN
Status: DC | PRN
  Administered 2022-06-22: 6000 mL

## 2022-06-22 MED ORDER — OXYCODONE HCL 5 MG/5ML PO SOLN: 5.0000 mg | Freq: Once | ORAL | Status: DC | PRN

## 2022-06-22 MED ORDER — PROPOFOL 10 MG/ML IV BOLUS
INTRAVENOUS | Status: DC | PRN
  Administered 2022-06-22: 300 mg via INTRAVENOUS

## 2022-06-22 MED ORDER — ONDANSETRON HCL 4 MG/2ML IJ SOLN: 4.0000 mg | Freq: Once | INTRAMUSCULAR | Status: DC | PRN

## 2022-06-22 MED ORDER — CELECOXIB 200 MG PO CAPS
ORAL_CAPSULE | ORAL | Status: AC
  Filled 2022-06-22: qty 1

## 2022-06-22 MED ORDER — MIDAZOLAM HCL 2 MG/2ML IJ SOLN
INTRAMUSCULAR | Status: AC
  Filled 2022-06-22: qty 2

## 2022-06-22 MED ORDER — ALBUTEROL SULFATE HFA 108 (90 BASE) MCG/ACT IN AERS
INHALATION_SPRAY | RESPIRATORY_TRACT | Status: AC
  Filled 2022-06-22: qty 6.7

## 2022-06-22 MED ORDER — CEFAZOLIN SODIUM-DEXTROSE 2-4 GM/100ML-% IV SOLN
INTRAVENOUS | Status: AC
  Filled 2022-06-22: qty 100

## 2022-06-22 MED ORDER — DEXAMETHASONE SODIUM PHOSPHATE 10 MG/ML IJ SOLN
INTRAMUSCULAR | Status: AC
  Filled 2022-06-22: qty 1

## 2022-06-22 MED ORDER — ACETAMINOPHEN 160 MG/5ML PO SOLN: 325.0000 mg | ORAL | Status: DC | PRN

## 2022-06-22 MED ORDER — ROPIVACAINE HCL 5 MG/ML IJ SOLN
INTRAMUSCULAR | Status: DC | PRN
  Administered 2022-06-22: 25 mL via PERINEURAL

## 2022-06-22 MED ORDER — OXYCODONE HCL 5 MG PO TABS: 5.0000 mg | ORAL_TABLET | Freq: Once | ORAL | Status: DC | PRN

## 2022-06-22 MED ORDER — GABAPENTIN 300 MG PO CAPS
ORAL_CAPSULE | ORAL | Status: AC
  Filled 2022-06-22: qty 1

## 2022-06-22 MED ORDER — CELECOXIB 200 MG PO CAPS
200.0000 mg | ORAL_CAPSULE | Freq: Once | ORAL | Status: AC
  Administered 2022-06-22: 200 mg via ORAL

## 2022-06-22 MED ORDER — MEPERIDINE HCL 25 MG/ML IJ SOLN: 6.2500 mg | INTRAMUSCULAR | Status: DC | PRN

## 2022-06-22 MED ORDER — FENTANYL CITRATE (PF) 100 MCG/2ML IJ SOLN
INTRAMUSCULAR | Status: DC | PRN
  Administered 2022-06-22: 50 ug via INTRAVENOUS

## 2022-06-22 MED ORDER — GABAPENTIN 300 MG PO CAPS
300.0000 mg | ORAL_CAPSULE | Freq: Once | ORAL | Status: AC
  Administered 2022-06-22: 300 mg via ORAL

## 2022-06-22 MED ORDER — CLONIDINE HCL (ANALGESIA) 100 MCG/ML EP SOLN
EPIDURAL | Status: DC | PRN
  Administered 2022-06-22: 50 ug

## 2022-06-22 MED ORDER — MIDAZOLAM HCL 2 MG/2ML IJ SOLN
2.0000 mg | Freq: Once | INTRAMUSCULAR | Status: AC
  Administered 2022-06-22: 2 mg via INTRAVENOUS

## 2022-06-22 MED ORDER — ALBUTEROL SULFATE HFA 108 (90 BASE) MCG/ACT IN AERS
INHALATION_SPRAY | RESPIRATORY_TRACT | Status: DC | PRN
  Administered 2022-06-22: 2 via RESPIRATORY_TRACT

## 2022-06-22 MED ORDER — ACETAMINOPHEN 500 MG PO TABS
ORAL_TABLET | ORAL | Status: AC
  Filled 2022-06-22: qty 2

## 2022-06-22 MED ORDER — CEFAZOLIN SODIUM-DEXTROSE 2-4 GM/100ML-% IV SOLN
2.0000 g | INTRAVENOUS | Status: AC
  Administered 2022-06-22: 2 g via INTRAVENOUS

## 2022-06-22 MED ORDER — FENTANYL CITRATE (PF) 100 MCG/2ML IJ SOLN
50.0000 ug | Freq: Once | INTRAMUSCULAR | Status: AC
  Administered 2022-06-22: 50 ug via INTRAVENOUS

## 2022-06-22 MED ORDER — LACTATED RINGERS IV SOLN: INTRAVENOUS | Status: DC

## 2022-06-22 MED ORDER — PROPOFOL 10 MG/ML IV BOLUS
INTRAVENOUS | Status: AC
  Filled 2022-06-22: qty 20

## 2022-06-22 MED ORDER — TRANEXAMIC ACID-NACL 1000-0.7 MG/100ML-% IV SOLN
1000.0000 mg | INTRAVENOUS | Status: AC
  Administered 2022-06-22: 1000 mg via INTRAVENOUS

## 2022-06-22 SURGICAL SUPPLY — 56 items
APL PRP STRL LF DISP 70% ISPRP (MISCELLANEOUS) ×1
BANDAGE ESMARK 6X9 LF (GAUZE/BANDAGES/DRESSINGS) IMPLANT
BLADE EXCALIBUR 4.0X13 (MISCELLANEOUS) IMPLANT
BLADE SURG 15 STRL LF DISP TIS (BLADE) IMPLANT
BLADE SURG 15 STRL SS (BLADE) ×1
BNDG CMPR 5X62 HK CLSR LF (GAUZE/BANDAGES/DRESSINGS) ×1
BNDG CMPR 6"X 5 YARDS HK CLSR (GAUZE/BANDAGES/DRESSINGS) ×1
BNDG CMPR 9X6 STRL LF SNTH (GAUZE/BANDAGES/DRESSINGS)
BNDG ELASTIC 4X5.8 VLCR STR LF (GAUZE/BANDAGES/DRESSINGS) ×1 IMPLANT
BNDG ELASTIC 6INX 5YD STR LF (GAUZE/BANDAGES/DRESSINGS) ×1 IMPLANT
BNDG ESMARK 6X9 LF (GAUZE/BANDAGES/DRESSINGS)
CHLORAPREP W/TINT 26 (MISCELLANEOUS) ×1 IMPLANT
COOLER ICEMAN CLASSIC (MISCELLANEOUS) ×1 IMPLANT
CUFF TOURN SGL QUICK 34 (TOURNIQUET CUFF)
CUFF TRNQT CYL 34X4.125X (TOURNIQUET CUFF) IMPLANT
DISSECTOR  3.8MM X 13CM (MISCELLANEOUS) ×1
DISSECTOR 3.8MM X 13CM (MISCELLANEOUS) ×1 IMPLANT
DRAPE ARTHROSCOPY W/POUCH 90 (DRAPES) ×1 IMPLANT
DRAPE IMP U-DRAPE 54X76 (DRAPES) ×1 IMPLANT
DRAPE INCISE IOBAN 66X45 STRL (DRAPES) IMPLANT
DRAPE U-SHAPE 47X51 STRL (DRAPES) ×1 IMPLANT
DW OUTFLOW CASSETTE/TUBE SET (MISCELLANEOUS) ×1 IMPLANT
ELECT REM PT RETURN 9FT ADLT (ELECTROSURGICAL)
ELECTRODE REM PT RTRN 9FT ADLT (ELECTROSURGICAL) IMPLANT
EXCALIBUR 3.8MM X 13CM (MISCELLANEOUS) IMPLANT
GAUZE 4X4 16PLY ~~LOC~~+RFID DBL (SPONGE) IMPLANT
GAUZE PAD ABD 8X10 STRL (GAUZE/BANDAGES/DRESSINGS) ×1 IMPLANT
GAUZE SPONGE 4X4 12PLY STRL (GAUZE/BANDAGES/DRESSINGS) ×1 IMPLANT
GAUZE XEROFORM 1X8 LF (GAUZE/BANDAGES/DRESSINGS) ×1 IMPLANT
GLOVE BIO SURGEON STRL SZ 6 (GLOVE) ×1 IMPLANT
GLOVE BIO SURGEON STRL SZ7.5 (GLOVE) ×1 IMPLANT
GLOVE BIOGEL PI IND STRL 6.5 (GLOVE) ×1 IMPLANT
GLOVE BIOGEL PI IND STRL 8 (GLOVE) ×1 IMPLANT
GOWN STRL REUS W/ TWL LRG LVL3 (GOWN DISPOSABLE) ×1 IMPLANT
GOWN STRL REUS W/ TWL XL LVL3 (GOWN DISPOSABLE) ×1 IMPLANT
GOWN STRL REUS W/TWL LRG LVL3 (GOWN DISPOSABLE) ×1
GOWN STRL REUS W/TWL XL LVL3 (GOWN DISPOSABLE) ×1
MANIFOLD NEPTUNE II (INSTRUMENTS) ×1 IMPLANT
NDL HYPO 18GX1.5 BLUNT FILL (NEEDLE) ×1 IMPLANT
NDL SAFETY ECLIP 18X1.5 (MISCELLANEOUS) ×1 IMPLANT
NEEDLE HYPO 18GX1.5 BLUNT FILL (NEEDLE) IMPLANT
PACK ARTHROSCOPY DSU (CUSTOM PROCEDURE TRAY) ×1 IMPLANT
PACK BASIN DAY SURGERY FS (CUSTOM PROCEDURE TRAY) ×1 IMPLANT
PAD COLD SHLDR WRAP-ON (PAD) ×1 IMPLANT
PADDING CAST COTTON 6X4 STRL (CAST SUPPLIES) ×1 IMPLANT
PENCIL SMOKE EVACUATOR (MISCELLANEOUS) IMPLANT
PORT APPOLLO RF 90DEGREE MULTI (SURGICAL WAND) ×1 IMPLANT
SLEEVE SCD COMPRESS KNEE MED (STOCKING) ×1 IMPLANT
SUT ETHILON 3 0 PS 1 (SUTURE) ×1 IMPLANT
SUT VIC AB 2-0 SH 27 (SUTURE)
SUT VIC AB 2-0 SH 27XBRD (SUTURE) IMPLANT
SUT VIC AB 3-0 FS2 27 (SUTURE) IMPLANT
SYR 5ML LL (SYRINGE) ×1 IMPLANT
TOWEL GREEN STERILE FF (TOWEL DISPOSABLE) ×2 IMPLANT
TRAY DSU PREP LF (CUSTOM PROCEDURE TRAY) ×1 IMPLANT
TUBING ARTHROSCOPY IRRIG 16FT (MISCELLANEOUS) ×1 IMPLANT

## 2022-06-22 NOTE — Interval H&P Note (Signed)
History and Physical Interval Note:  06/22/2022 11:56 AM  Cheryl Tran  has presented today for surgery, with the diagnosis of RIGHT PATELLA CHONDRAL LESION.  The various methods of treatment have been discussed with the patient and family. After consideration of risks, benefits and other options for treatment, the patient has consented to  Procedure(s): RIGHT KNEE ARTHROSCOPY PATELLA CHONDROPLASTY  WITH MACI CARTILAGE HARVEST (Right) as a surgical intervention.  The patient's history has been reviewed, patient examined, no change in status, stable for surgery.  I have reviewed the patient's chart and labs.  Questions were answered to the patient's satisfaction.     Vanetta Mulders

## 2022-06-22 NOTE — Progress Notes (Signed)
Assisted Dr. Rodman Comp with right, adductor canal, ultrasound guided block. Side rails up, monitors on throughout procedure. See vital signs in flow sheet. Tolerated Procedure well.

## 2022-06-22 NOTE — Anesthesia Procedure Notes (Signed)
Procedure Name: LMA Insertion Date/Time: 06/22/2022 12:26 PM  Performed by: Tawni Millers, CRNAPre-anesthesia Checklist: Patient identified, Emergency Drugs available, Suction available and Patient being monitored Patient Re-evaluated:Patient Re-evaluated prior to induction Oxygen Delivery Method: Circle system utilized Preoxygenation: Pre-oxygenation with 100% oxygen Induction Type: IV induction Ventilation: Mask ventilation without difficulty LMA: LMA inserted LMA Size: 4.0 Number of attempts: 1 Airway Equipment and Method: Bite block Placement Confirmation: positive ETCO2 Tube secured with: Tape Dental Injury: Teeth and Oropharynx as per pre-operative assessment

## 2022-06-22 NOTE — Transfer of Care (Signed)
Immediate Anesthesia Transfer of Care Note  Patient: Cheryl Tran  Procedure(s) Performed: RIGHT KNEE ARTHROSCOPY PATELLA CHONDROPLASTY  WITH MACI CARTILAGE HARVEST (Right: Knee)  Patient Location: PACU  Anesthesia Type:GA combined with regional for post-op pain  Level of Consciousness: sedated  Airway & Oxygen Therapy: Patient Spontanous Breathing and Patient connected to face mask oxygen  Post-op Assessment: Report given to RN and Post -op Vital signs reviewed and stable  Post vital signs: Reviewed and stable  Last Vitals:  Vitals Value Taken Time  BP 110/80 06/22/22 1257  Temp    Pulse 64 06/22/22 1257  Resp 19 06/22/22 1257  SpO2 100 % 06/22/22 1257  Vitals shown include unvalidated device data.  Last Pain:  Vitals:   06/22/22 1021  TempSrc: Oral  PainSc: 5          Complications: No notable events documented.

## 2022-06-22 NOTE — Anesthesia Preprocedure Evaluation (Addendum)
Anesthesia Evaluation  Patient identified by MRN, date of birth, ID band Patient awake    Reviewed: Allergy & Precautions, H&P , NPO status , Patient's Chart, lab work & pertinent test results  Airway Mallampati: II  TM Distance: >3 FB Neck ROM: Full    Dental   Pulmonary asthma , Current Smoker   Pulmonary exam normal        Cardiovascular Exercise Tolerance: Good negative cardio ROS  Rhythm:Regular Rate:Normal     Neuro/Psych  PSYCHIATRIC DISORDERS Anxiety Depression Bipolar Disorder   negative neurological ROS     GI/Hepatic negative GI ROS, Neg liver ROS,,,  Endo/Other  PCOS  Renal/GU negative Renal ROS  negative genitourinary   Musculoskeletal   Abdominal   Peds  Hematology negative hematology ROS (+)   Anesthesia Other Findings   Reproductive/Obstetrics negative OB ROS                             Anesthesia Physical Anesthesia Plan  ASA: 2  Anesthesia Plan: General and Regional   Post-op Pain Management: Regional block*, Tylenol PO (pre-op)*, Celebrex PO (pre-op)* and Gabapentin PO (pre-op)*   Induction: Intravenous  PONV Risk Score and Plan: 3 and Ondansetron, Dexamethasone, Treatment may vary due to age or medical condition and Midazolam  Airway Management Planned: LMA  Additional Equipment: None  Intra-op Plan:   Post-operative Plan: Extubation in OR  Informed Consent: I have reviewed the patients History and Physical, chart, labs and discussed the procedure including the risks, benefits and alternatives for the proposed anesthesia with the patient or authorized representative who has indicated his/her understanding and acceptance.       Plan Discussed with: Anesthesiologist and CRNA  Anesthesia Plan Comments: (  )        Anesthesia Quick Evaluation

## 2022-06-22 NOTE — Anesthesia Postprocedure Evaluation (Signed)
Anesthesia Post Note  Patient: Cheryl Tran  Procedure(s) Performed: RIGHT KNEE ARTHROSCOPY PATELLA CHONDROPLASTY  WITH MACI CARTILAGE HARVEST (Right: Knee)     Patient location during evaluation: PACU Anesthesia Type: General Level of consciousness: awake and alert Pain management: pain level controlled Vital Signs Assessment: post-procedure vital signs reviewed and stable Respiratory status: spontaneous breathing, nonlabored ventilation, respiratory function stable and patient connected to nasal cannula oxygen Cardiovascular status: blood pressure returned to baseline and stable Postop Assessment: no apparent nausea or vomiting Anesthetic complications: no  No notable events documented.  Last Vitals:  Vitals:   06/22/22 1315 06/22/22 1408  BP: (!) 122/95 (!) 135/93  Pulse: (!) 54 70  Resp: 13   Temp:  36.6 C  SpO2: 98% 96%    Last Pain:  Vitals:   06/22/22 1336  TempSrc:   PainSc: 0-No pain                 Tiajuana Amass

## 2022-06-22 NOTE — Discharge Instructions (Addendum)
Discharge Instructions    Attending Surgeon: Vanetta Mulders, MD Office Phone Number: [phone]   Diagnosis and Procedures:    Surgeries Performed: Right knee chondral biopsy with chondroplasty  Discharge Plan:    Diet: Resume usual diet. Begin with light or bland foods.  Drink plenty of fluids.  Activity:  Right knee activity and weight bearing as tolerated. You are advised to go home directly from the hospital or surgical center. Restrict your activities.  GENERAL INSTRUCTIONS: 1.  Please apply ice to your wound to help with swelling and inflammation. This will improve your comfort and your overall recovery following surgery.     2. Please call Dr. Eddie Dibbles office at [phone] with questions Monday-Friday during business hours. If no one answers, please leave a message and someone should get back to the patient within 24 hours. For emergencies please call 911 or proceed to the emergency room.   3. Patient to notify surgical team if experiences any of the following: Bowel/Bladder dysfunction, uncontrolled pain, nerve/muscle weakness, incision with increased drainage or redness, nausea/vomiting and Fever greater than 101.0 F.  Be alert for signs of infection including redness, streaking, odor, fever or chills. Be alert for excessive pain or bleeding and notify your surgeon immediately.  WOUND INSTRUCTIONS:   Leave your dressing, cast, or splint in place until your post operative visit.  Keep it clean and dry.  Always keep the incision clean and dry until the staples/sutures are removed. If there is no drainage from the incision you should keep it open to air. If there is drainage from the incision you must keep it covered at all times until the drainage stops  Do not soak in a bath tub, hot tub, pool, lake or other body of water until 21 days after your surgery and your incision is completely dry and healed.  If you have removable sutures (or staples) they must be  removed 10-14 days (unless otherwise instructed) from the day of your surgery.     1)  Elevate the extremity as much as possible.  2)  Keep the dressing clean and dry.  3)  Please call us if the dressing becomes wet or dirty.  4)  If you are experiencing worsening pain or worsening swelling, please call.     MEDICATIONS: Resume all previous home medications at the previous prescribed dose and frequency unless otherwise noted Start taking the  pain medications on an as-needed basis as prescribed  Please taper down pain medication over the next week following surgery.  Ideally you should not require a refill of any narcotic pain medication.  Take pain medication with food to minimize nausea. In addition to the prescribed pain medication, you may take over-the-counter pain relievers such as Tylenol.  Do NOT take additional tylenol if your pain medication already has tylenol in it.  Aspirin '325mg'$  daily for four weeks.      FOLLOWUP INSTRUCTIONS: 1. Follow up at the Physical Therapy Clinic 3-4 days following surgery. This appointment should be scheduled unless other arrangements have been made.The Physical Therapy scheduling number is [phone] if an appointment has not already been arranged.  2. Contact Dr. Eddie Dibbles office during office hours at [phone] or the practice after hours line at [phone] for non-emergencies. For medical emergencies call 911.   Discharge Location: Home     Post Anesthesia Home Care Instructions  Activity: Get plenty of rest for the remainder of the day. A responsible individual must stay with you  for 24 hours following the procedure.  For the next 24 hours, DO NOT: -Drive a car -Paediatric nurse -Drink alcoholic beverages -Take any medication unless instructed by your physician -Make any legal decisions or sign important papers.  Meals: Start with liquid foods such as gelatin or soup. Progress to regular foods as tolerated. Avoid greasy,  spicy, heavy foods. If nausea and/or vomiting occur, drink only clear liquids until the nausea and/or vomiting subsides. Call your physician if vomiting continues.  Special Instructions/Symptoms: Your throat may feel dry or sore from the anesthesia or the breathing tube placed in your throat during surgery. If this causes discomfort, gargle with warm salt water. The discomfort should disappear within 24 hours.  If you had a scopolamine patch placed behind your ear for the management of post- operative nausea and/or vomiting:  1. The medication in the patch is effective for 72 hours, after which it should be removed.  Wrap patch in a tissue and discard in the trash. Wash hands thoroughly with soap and water. 2. You may remove the patch earlier than 72 hours if you experience unpleasant side effects which may include dry mouth, dizziness or visual disturbances. 3. Avoid touching the patch. Wash your hands with soap and water after contact with the patch.

## 2022-06-22 NOTE — Brief Op Note (Signed)
   Brief Op Note  Date of Surgery: 06/22/2022  Preoperative Diagnosis: RIGHT PATELLA CHONDRAL LESION  Postoperative Diagnosis: same  Procedure: Procedure(s): RIGHT KNEE ARTHROSCOPY PATELLA CHONDROPLASTY  WITH MACI CARTILAGE HARVEST  Implants: * No implants in log *  Surgeons: Surgeon(s): Vanetta Mulders, MD  Anesthesia: Regional    Estimated Blood Loss: See anesthesia record  Complications: None  Condition to PACU: Stable  Yevonne Pax, MD 06/22/2022 12:57 PM

## 2022-06-22 NOTE — Anesthesia Procedure Notes (Signed)
Anesthesia Regional Block: Adductor canal block   Pre-Anesthetic Checklist: , timeout performed,  Correct Patient, Correct Site, Correct Laterality,  Correct Procedure, Correct Position, site marked,  Risks and benefits discussed,  Surgical consent,  Pre-op evaluation,  At surgeon's request and post-op pain management  Laterality: Right  Prep: chloraprep       Needles:  Injection technique: Single-shot  Needle Type: Echogenic Needle     Needle Length: 9cm  Needle Gauge: 21     Additional Needles:   Procedures:,,,, ultrasound used (permanent image in chart),,    Narrative:  Start time: 06/22/2022 11:20 AM End time: 06/22/2022 11:27 AM Injection made incrementally with aspirations every 5 mL.  Performed by: Personally  Anesthesiologist: Suzette Battiest, MD

## 2022-06-22 NOTE — Op Note (Signed)
Date of Surgery: 06/22/2022  INDICATIONS: [name] is a 32 y.o.-year-old female with an old injury with persistent right knee pain and swelling.  The risk and benefits of the procedure were discussed in detail and documented in the pre-operative evaluation.   PREOPERATIVE DIAGNOSIS: 1.  Right knee medial chondral lesion  POSTOPERATIVE DIAGNOSIS: 1.  Right knee medial patella chondral lesion measuring 1-1/2 x 2 cm 2.  Right knee partial anterior cruciate ligament tear  PROCEDURE: 1.  Debridement and chondroplasty of right knee medial patella chondral lesion measuring 1-1/2 x 2 cm 2.  Right knee harvest of cartilage for MACI  SURGEON: Yevonne Pax MD  ASSISTANT: Merwyn Katos  ANESTHESIA:  general  IV FLUIDS AND URINE: See anesthesia record.  ANTIBIOTICS: Ancef  ESTIMATED BLOOD LOSS: 5 mL.  IMPLANTS:  * No implants in log *  DRAINS: None  CULTURES: None  COMPLICATIONS: none  DESCRIPTION OF PROCEDURE:  Examination under anesthesia: A careful examination under anesthesia was performed.  Knee ROM motion was: -5 - 135 Lachman: Normal Pivot Shift: Normal Posterior drawer: normal.   Varus stability in full extension: normal.   Varus stability in 30 degrees of flexion: normal.  Valgus stability in full extension: normal.   Valgus stability in 30 degrees of flexion: normal.  Posterolateral drawer: normal   Intra-operative findings: A thorough arthroscopic examination of the knee was performed.  The findings are: 1. Suprapatellar pouch: Normal 2. Undersurface of median ridge: Normal 3. Medial patellar facet: 2-1/2 x 1-1/2 cm full-thickness cartilage loss 4. Lateral patellar facet: Normal 5. Trochlea: Normal 6. Lateral gutter/popliteus tendon: Normal 7. Hoffa's fat pad: Normal 8. Medial gutter/plica: Normal 9. ACL: Partial tear involving the anterior medial bundle 10. PCL: Normal 11. Medial meniscus: Normal 12. Medial compartment cartilage: Normal 13. Lateral  meniscus: Normal 14. Lateral compartment cartilage: Normal  I identified the patient in the pre-operative holding area.  I marked the operative knee with my initials. I reviewed the risks and benefits of the proposed surgical intervention and the patient wished to proceed.  Anesthesia performed a peripheral nerve block.  Patient was subsequently taken back to the operating room.  The patient was transferred to the operative suite and placed in the supine position with all bony prominences padded.     SCDs were placed on the non-operative lower extremity. Appropriate antibiotics was administered within 1 hour before incision. The operative lower extremity was then prepped and draped in standard fashion. A time out was performed confirming the correct extremity, correct patient and correct procedure.    A standard anterolateral portal was made with an 11 blade.  The ideal position for the anteromedial portal was established using a spinal needle.  This AM portal was then created under direct visualization with an 11 blade.  A full diagnostic arthroscopy was then performed, as described above, including probing of the chondral and meniscal surfaces.  There was an avulsion of the anterior medial bundle of the ACL off of the wall of the femur.  This was not taken down so as to not impart additional instability.      At this time the undersurface of the patella was visualized.  Given the fact that there was a large defect a chondroplasty was performed back to subchondral bone.  This lesion measured 1-1/2 x 2 cm.  This was engaging as the knee went into 20 degrees of flexion.  At this time the medial femoral condyle was utilized and the nonarticular portion for a  biopsy for MACI.  3 TicTac sizes of cartilage were removed with a ring curette and grabbed into the medium of the kit.  That concluded the case.  Skin was closed with 2-0 Vicryl and 3-0 nylon. Xeroform gauze, gauze, Tegaderm, Iceman and brace were  applied.  Instrument, sponge, and needle counts were correct prior to wound closure and at the conclusion of the case.  The patient was taken to the PACU without complication     POSTOPERATIVE PLAN: She will be weightbearing as tolerated on the right leg.  I will see her back in 2 weeks for suture removal.  We will plan to further discuss Macy harvest and need for possible ACL repair.  Yevonne Pax, MD 12:59 PM

## 2022-06-23 ENCOUNTER — Encounter (HOSPITAL_BASED_OUTPATIENT_CLINIC_OR_DEPARTMENT_OTHER): Payer: Self-pay | Admitting: Orthopaedic Surgery

## 2022-06-23 NOTE — Progress Notes (Signed)
Call can't be completed recording

## 2022-06-30 ENCOUNTER — Other Ambulatory Visit (HOSPITAL_COMMUNITY): Payer: Self-pay | Admitting: Psychiatry

## 2022-06-30 ENCOUNTER — Other Ambulatory Visit (HOSPITAL_COMMUNITY): Payer: Self-pay

## 2022-06-30 ENCOUNTER — Other Ambulatory Visit (HOSPITAL_COMMUNITY): Payer: Self-pay | Admitting: Family

## 2022-06-30 DIAGNOSIS — F3162 Bipolar disorder, current episode mixed, moderate: Secondary | ICD-10-CM

## 2022-06-30 DIAGNOSIS — F4310 Post-traumatic stress disorder, unspecified: Secondary | ICD-10-CM

## 2022-06-30 DIAGNOSIS — F411 Generalized anxiety disorder: Secondary | ICD-10-CM

## 2022-06-30 MED ORDER — GABAPENTIN 300 MG PO CAPS
300.0000 mg | ORAL_CAPSULE | Freq: Three times a day (TID) | ORAL | 3 refills | Status: DC
  Filled 2022-06-30: qty 90, 30d supply, fill #0

## 2022-06-30 MED ORDER — PRAZOSIN HCL 1 MG PO CAPS
1.0000 mg | ORAL_CAPSULE | Freq: Every day | ORAL | 3 refills | Status: DC
  Filled 2022-06-30: qty 30, 30d supply, fill #0

## 2022-06-30 MED ORDER — QUETIAPINE FUMARATE 50 MG PO TABS
50.0000 mg | ORAL_TABLET | Freq: Every day | ORAL | 3 refills | Status: DC
  Filled 2022-06-30: qty 30, 30d supply, fill #0

## 2022-07-03 ENCOUNTER — Ambulatory Visit (HOSPITAL_BASED_OUTPATIENT_CLINIC_OR_DEPARTMENT_OTHER): Payer: Self-pay | Admitting: Orthopaedic Surgery

## 2022-07-03 ENCOUNTER — Ambulatory Visit (INDEPENDENT_AMBULATORY_CARE_PROVIDER_SITE_OTHER): Payer: Medicaid Other | Admitting: Orthopaedic Surgery

## 2022-07-03 DIAGNOSIS — M2341 Loose body in knee, right knee: Secondary | ICD-10-CM

## 2022-07-03 DIAGNOSIS — M2391 Unspecified internal derangement of right knee: Secondary | ICD-10-CM
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
## 2022-07-07 ENCOUNTER — Encounter (HOSPITAL_COMMUNITY): Payer: Medicaid Other | Admitting: Psychiatry

## 2022-07-08 ENCOUNTER — Telehealth (INDEPENDENT_AMBULATORY_CARE_PROVIDER_SITE_OTHER): Payer: Medicaid Other | Admitting: Psychiatry

## 2022-07-08 ENCOUNTER — Other Ambulatory Visit: Payer: Self-pay

## 2022-07-08 ENCOUNTER — Encounter (HOSPITAL_COMMUNITY): Payer: Self-pay | Admitting: Psychiatry

## 2022-07-08 DIAGNOSIS — F4310 Post-traumatic stress disorder, unspecified: Secondary | ICD-10-CM

## 2022-07-08 DIAGNOSIS — F3162 Bipolar disorder, current episode mixed, moderate: Secondary | ICD-10-CM

## 2022-07-08 DIAGNOSIS — F411 Generalized anxiety disorder: Secondary | ICD-10-CM | POA: Diagnosis not present

## 2022-07-08 MED ORDER — GABAPENTIN 300 MG PO CAPS
300.0000 mg | ORAL_CAPSULE | Freq: Three times a day (TID) | ORAL | 3 refills | Status: AC
  Filled 2022-07-08: qty 90, 30d supply, fill #0

## 2022-07-08 MED ORDER — PRAZOSIN HCL 1 MG PO CAPS
1.0000 mg | ORAL_CAPSULE | Freq: Every day | ORAL | 3 refills | Status: DC
  Filled 2022-07-08: qty 30, 30d supply, fill #0

## 2022-07-08 MED ORDER — QUETIAPINE FUMARATE 150 MG PO TABS
150.0000 mg | ORAL_TABLET | Freq: Every day | ORAL | 3 refills | Status: DC
  Filled 2022-07-08: qty 30, 30d supply, fill #0
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
## 2022-07-09 ENCOUNTER — Telehealth (HOSPITAL_COMMUNITY): Payer: Self-pay | Admitting: *Deleted

## 2022-07-09 ENCOUNTER — Other Ambulatory Visit (HOSPITAL_COMMUNITY): Payer: Self-pay | Admitting: Psychiatry

## 2022-07-09 ENCOUNTER — Other Ambulatory Visit: Payer: Self-pay

## 2022-07-09 DIAGNOSIS — F3162 Bipolar disorder, current episode mixed, moderate: Secondary | ICD-10-CM

## 2022-07-09 DIAGNOSIS — F411 Generalized anxiety disorder: Secondary | ICD-10-CM

## 2022-07-09 MED ORDER — QUETIAPINE FUMARATE ER 150 MG PO TB24
150.0000 mg | ORAL_TABLET | Freq: Every day | ORAL | 3 refills | Status: DC
  Filled 2022-07-09: qty 30, 30d supply, fill #0

## 2022-07-09 NOTE — Telephone Encounter (Signed)
Patient's pharmacy called stating that Seroquel 150mg  is on backorder and not available. Asking if the psychiatrist would like to write a new prescription for Seroquel XR and if so the patient is asking for a call to explain the difference and what to expect.

## 2022-07-09 NOTE — Telephone Encounter (Signed)
Medication reordered and sent to preferred pharmacy.

## 2022-07-10 ENCOUNTER — Other Ambulatory Visit: Payer: Self-pay

## 2022-07-13 ENCOUNTER — Other Ambulatory Visit: Payer: Self-pay

## 2022-07-14 ENCOUNTER — Other Ambulatory Visit: Payer: Self-pay

## 2022-07-14 ENCOUNTER — Ambulatory Visit (HOSPITAL_COMMUNITY): Payer: Medicaid Other | Admitting: Mental Health

## 2022-07-15 DIAGNOSIS — Z0289 Encounter for other administrative examinations: Secondary | ICD-10-CM

## 2022-07-16 ENCOUNTER — Other Ambulatory Visit: Payer: Self-pay

## 2022-07-20 ENCOUNTER — Other Ambulatory Visit: Payer: Self-pay

## 2022-07-22 ENCOUNTER — Emergency Department (HOSPITAL_COMMUNITY): Payer: Medicaid Other

## 2022-07-22 ENCOUNTER — Encounter (HOSPITAL_COMMUNITY): Payer: Self-pay

## 2022-07-22 ENCOUNTER — Other Ambulatory Visit: Payer: Self-pay

## 2022-07-22 ENCOUNTER — Emergency Department (HOSPITAL_COMMUNITY)
Admission: EM | Admit: 2022-07-22 | Discharge: 2022-07-22 | Discharge status: Against Medical Advice | Payer: Medicaid Other | Attending: Emergency Medicine | Admitting: Emergency Medicine

## 2022-07-22 DIAGNOSIS — M79603 Pain in arm, unspecified: Secondary | ICD-10-CM | POA: Diagnosis not present

## 2022-07-22 DIAGNOSIS — F419 Anxiety disorder, unspecified: Secondary | ICD-10-CM | POA: Diagnosis not present

## 2022-07-22 DIAGNOSIS — Z7951 Long term (current) use of inhaled steroids: Secondary | ICD-10-CM | POA: Diagnosis not present

## 2022-07-22 DIAGNOSIS — R0789 Other chest pain: Secondary | ICD-10-CM | POA: Insufficient documentation

## 2022-07-22 DIAGNOSIS — R079 Chest pain, unspecified: Secondary | ICD-10-CM | POA: Diagnosis not present

## 2022-07-22 DIAGNOSIS — M542 Cervicalgia: Secondary | ICD-10-CM | POA: Diagnosis not present

## 2022-07-22 DIAGNOSIS — J45909 Unspecified asthma, uncomplicated: Secondary | ICD-10-CM | POA: Diagnosis not present

## 2022-07-22 HISTORY — DX: Anxiety disorder, unspecified: F41.9

## 2022-07-22 NOTE — ED Notes (Signed)
Refused to sign AMA  
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
## 2022-07-22 NOTE — ED Notes (Signed)
CSI at bedside.

## 2022-07-22 NOTE — ED Notes (Signed)
Pt refusing blood work. Provider aware.

## 2022-07-22 NOTE — Discharge Instructions (Signed)
Please return if you continue to have chest pain or want further evaluation for the chest pain, chest tightness that you are experiencing on arrival.  As we discussed I cannot tell without performing some lab work and testing whether or not there could be anything going on with your heart or lungs causing your chest pain.  If you begin having suicidal ideation, homicidal ideation please return for further evaluation.

## 2022-07-23 ENCOUNTER — Encounter: Payer: Self-pay | Admitting: Pharmacist

## 2022-07-24 ENCOUNTER — Other Ambulatory Visit: Payer: Self-pay

## 2022-07-29 DIAGNOSIS — F250 Schizoaffective disorder, bipolar type: Secondary | ICD-10-CM | POA: Diagnosis not present

## 2022-07-30 ENCOUNTER — Encounter: Payer: Self-pay | Admitting: Nurse Practitioner

## 2022-08-03 ENCOUNTER — Other Ambulatory Visit (HOSPITAL_BASED_OUTPATIENT_CLINIC_OR_DEPARTMENT_OTHER): Payer: Self-pay | Admitting: Orthopaedic Surgery

## 2022-08-03 DIAGNOSIS — M2391 Unspecified internal derangement of right knee: Secondary | ICD-10-CM

## 2022-08-05 ENCOUNTER — Ambulatory Visit: Payer: Medicaid Other | Admitting: Nurse Practitioner

## 2022-08-10 ENCOUNTER — Other Ambulatory Visit: Payer: Self-pay

## 2022-08-10 ENCOUNTER — Encounter (HOSPITAL_BASED_OUTPATIENT_CLINIC_OR_DEPARTMENT_OTHER): Payer: Self-pay | Admitting: Orthopaedic Surgery

## 2022-08-11 ENCOUNTER — Encounter: Payer: Self-pay | Admitting: Nurse Practitioner

## 2022-08-11 ENCOUNTER — Ambulatory Visit (INDEPENDENT_AMBULATORY_CARE_PROVIDER_SITE_OTHER): Payer: Medicaid Other | Admitting: Nurse Practitioner

## 2022-08-11 VITALS — BP 116/80 | HR 76 | Resp 14 | Ht 68.0 in | Wt 193.0 lb

## 2022-08-11 DIAGNOSIS — N926 Irregular menstruation, unspecified: Secondary | ICD-10-CM

## 2022-08-11 DIAGNOSIS — N970 Female infertility associated with anovulation: Secondary | ICD-10-CM

## 2022-08-11 DIAGNOSIS — E282 Polycystic ovarian syndrome: Secondary | ICD-10-CM | POA: Diagnosis not present

## 2022-08-11 DIAGNOSIS — Z789 Other specified health status: Secondary | ICD-10-CM

## 2022-08-11 LAB — PREGNANCY, URINE: Preg Test, Ur: NEGATIVE

## 2022-08-11 MED ORDER — CLOMID 50 MG PO TABS
50.0000 mg | ORAL_TABLET | Freq: Every day | ORAL | 0 refills | Status: DC
Start: 2022-08-11 — End: 2023-02-08
  Filled 2022-08-17 – 2022-09-18 (×2): qty 5, 5d supply, fill #0

## 2022-08-11 NOTE — Progress Notes (Signed)
Acute Office Visit  Subjective:    Patient ID: Cheryl Tran, female    DOB: [birth year], 32 y.o.   MRN: [id number]   HPI 32 y.o. G3P0030 presents today for positive home UPT. She took 2 tests one day and had one negative and one positive, then took 2 more tests a few days later with again one positive and one negative result. No bleeding since.LMP 07/13/2022 but had a couple days of spotting when menses were due this month. H/O miscarriage x 3. Has been trying to conceive for 5 years. Tracking cycles, doing OPKs. Last pregnancy a couple of years ago. H/O PCOS. Metformin BID. Infertility specialist recommended. She contacted someone in [place] but plans to find someone local. Low 21-day progesterone in January. Interested in Clomid until she finds local infertility specialist. Smoker.   Patient's last menstrual period was 07/13/2022 (exact date).    Review of Systems  Constitutional: Negative.        Objective:    Physical Exam Constitutional:      Appearance: Normal appearance.    BP 116/80   Pulse 76   Resp 14   Ht 5\' 8"  (1.727 m)   Wt 193 lb (87.5 kg)   LMP 07/13/2022 (Exact Date)   BMI 29.35 kg/m  Wt Readings from Last 3 Encounters:  08/11/22 193 lb (87.5 kg)  07/22/22 202 lb 13.2 oz (92 kg)  06/22/22 202 lb 13.2 oz (92 kg)        Patient informed chaperone available to be present for breast and/or pelvic exam. Patient has requested no chaperone to be present. Patient has been advised what will be completed during breast and pelvic exam.   UPT negative  Assessment & Plan:   Problem List Items Addressed This Visit       Endocrine   PCOS (polycystic ovarian syndrome)   Relevant Medications   clomiPHENE (CLOMID) 50 MG tablet   Other Visit Diagnoses     Attempting to conceive    -  Primary   Relevant Medications   clomiPHENE (CLOMID) 50 MG tablet   Missed menses       Relevant Orders   Pregnancy, urine   hCG, quantitative, pregnancy   Anovulation       Relevant  Medications   clomiPHENE (CLOMID) 50 MG tablet      Plan: UPT negative today. hCG quant pending. Clomid cycle days 3-7, return for 21-day progesterone. [employer] recommended. Recommend smoking cessation.      Olivia Mackie DNP, 3:48 PM 08/11/2022

## 2022-08-12 LAB — HCG, QUANTITATIVE, PREGNANCY: HCG, Total, QN: <5 m[IU]/mL

## 2022-08-13 DIAGNOSIS — F411 Generalized anxiety disorder: Secondary | ICD-10-CM | POA: Diagnosis not present

## 2022-08-13 DIAGNOSIS — F250 Schizoaffective disorder, bipolar type: Secondary | ICD-10-CM | POA: Diagnosis not present

## 2022-08-13 DIAGNOSIS — F4310 Post-traumatic stress disorder, unspecified: Secondary | ICD-10-CM | POA: Diagnosis not present

## 2022-08-17 ENCOUNTER — Other Ambulatory Visit (HOSPITAL_COMMUNITY): Payer: Self-pay

## 2022-08-17 MED ORDER — ARIPIPRAZOLE 5 MG PO TABS: 10.0000 mg | ORAL_TABLET | ORAL | 1 refills | Status: DC

## 2022-08-17 MED ORDER — PRAZOSIN HCL 2 MG PO CAPS: 2.0000 mg | ORAL_CAPSULE | Freq: Every day | ORAL | 1 refills | Status: DC

## 2022-08-17 MED ORDER — OXCARBAZEPINE 300 MG PO TABS: 300.0000 mg | ORAL_TABLET | Freq: Two times a day (BID) | ORAL | 1 refills | Status: DC

## 2022-08-17 NOTE — Progress Notes (Signed)

## 2022-08-18 ENCOUNTER — Ambulatory Visit (HOSPITAL_BASED_OUTPATIENT_CLINIC_OR_DEPARTMENT_OTHER): Payer: Medicaid Other | Admitting: Anesthesiology

## 2022-08-18 ENCOUNTER — Encounter (HOSPITAL_BASED_OUTPATIENT_CLINIC_OR_DEPARTMENT_OTHER)
Admission: RE | Disposition: A | Discharge status: Home / Self Care | Payer: Self-pay | Source: Home / Self Care | Attending: Orthopaedic Surgery

## 2022-08-18 ENCOUNTER — Other Ambulatory Visit: Payer: Self-pay

## 2022-08-18 ENCOUNTER — Ambulatory Visit (HOSPITAL_BASED_OUTPATIENT_CLINIC_OR_DEPARTMENT_OTHER)
Admission: RE | Admit: 2022-08-18 | Discharge: 2022-08-18 | Disposition: A | Discharge status: Home / Self Care | Payer: Medicaid Other | Attending: Orthopaedic Surgery | Admitting: Orthopaedic Surgery

## 2022-08-18 ENCOUNTER — Encounter (HOSPITAL_BASED_OUTPATIENT_CLINIC_OR_DEPARTMENT_OTHER): Payer: Self-pay | Admitting: Orthopaedic Surgery

## 2022-08-18 DIAGNOSIS — F418 Other specified anxiety disorders: Secondary | ICD-10-CM | POA: Insufficient documentation

## 2022-08-18 DIAGNOSIS — J45909 Unspecified asthma, uncomplicated: Secondary | ICD-10-CM | POA: Diagnosis not present

## 2022-08-18 DIAGNOSIS — X58XXXA Exposure to other specified factors, initial encounter: Secondary | ICD-10-CM | POA: Diagnosis not present

## 2022-08-18 DIAGNOSIS — F319 Bipolar disorder, unspecified: Secondary | ICD-10-CM | POA: Diagnosis not present

## 2022-08-18 DIAGNOSIS — M2341 Loose body in knee, right knee: Secondary | ICD-10-CM

## 2022-08-18 DIAGNOSIS — M2429 Disorder of ligament, other specified site: Secondary | ICD-10-CM

## 2022-08-18 DIAGNOSIS — Z01818 Encounter for other preprocedural examination: Secondary | ICD-10-CM

## 2022-08-18 DIAGNOSIS — M2241 Chondromalacia patellae, right knee: Secondary | ICD-10-CM

## 2022-08-18 DIAGNOSIS — M2391 Unspecified internal derangement of right knee: Secondary | ICD-10-CM | POA: Diagnosis not present

## 2022-08-18 DIAGNOSIS — S83511A Sprain of anterior cruciate ligament of right knee, initial encounter: Secondary | ICD-10-CM | POA: Insufficient documentation

## 2022-08-18 DIAGNOSIS — M2351 Chronic instability of knee, right knee: Secondary | ICD-10-CM

## 2022-08-18 DIAGNOSIS — E282 Polycystic ovarian syndrome: Secondary | ICD-10-CM | POA: Diagnosis not present

## 2022-08-18 DIAGNOSIS — F17210 Nicotine dependence, cigarettes, uncomplicated: Secondary | ICD-10-CM | POA: Insufficient documentation

## 2022-08-18 DIAGNOSIS — S83241A Other tear of medial meniscus, current injury, right knee, initial encounter: Secondary | ICD-10-CM | POA: Insufficient documentation

## 2022-08-18 DIAGNOSIS — M238X1 Other internal derangements of right knee: Secondary | ICD-10-CM

## 2022-08-18 HISTORY — PX: OSTEOCHONDRAL DEFECT REPAIR/RECONSTRUCTION: SHX6232

## 2022-08-18 HISTORY — PX: KNEE ARTHROSCOPY WITH ANTERIOR CRUCIATE LIGAMENT (ACL) REPAIR: SHX5644

## 2022-08-18 LAB — POCT PREGNANCY, URINE: Preg Test, Ur: NEGATIVE

## 2022-08-18 SURGERY — KNEE ARTHROSCOPY WITH ANTERIOR CRUCIATE LIGAMENT (ACL) REPAIR
Anesthesia: Regional | Site: Knee | Laterality: Right

## 2022-08-18 MED ORDER — GABAPENTIN 300 MG PO CAPS
300.0000 mg | ORAL_CAPSULE | Freq: Once | ORAL | Status: AC
  Administered 2022-08-18: 300 mg via ORAL

## 2022-08-18 MED ORDER — MIDAZOLAM HCL 2 MG/2ML IJ SOLN
2.0000 mg | Freq: Once | INTRAMUSCULAR | Status: AC
  Administered 2022-08-18: 2 mg via INTRAVENOUS

## 2022-08-18 MED ORDER — OXYCODONE HCL 5 MG PO TABS
ORAL_TABLET | ORAL | Status: AC
  Filled 2022-08-18: qty 1

## 2022-08-18 MED ORDER — ACETAMINOPHEN 500 MG PO TABS
1000.0000 mg | ORAL_TABLET | Freq: Once | ORAL | Status: AC
  Administered 2022-08-18: 1000 mg via ORAL

## 2022-08-18 MED ORDER — KETAMINE HCL 50 MG/5ML IJ SOSY
PREFILLED_SYRINGE | INTRAMUSCULAR | Status: AC
  Filled 2022-08-18: qty 5

## 2022-08-18 MED ORDER — FENTANYL CITRATE (PF) 100 MCG/2ML IJ SOLN
INTRAMUSCULAR | Status: DC | PRN
  Administered 2022-08-18 (×2): 50 ug via INTRAVENOUS

## 2022-08-18 MED ORDER — EPINEPHRINE PF 1 MG/ML IJ SOLN
INTRAMUSCULAR | Status: AC
  Filled 2022-08-18: qty 2

## 2022-08-18 MED ORDER — GABAPENTIN 300 MG PO CAPS
ORAL_CAPSULE | ORAL | Status: AC
  Filled 2022-08-18: qty 1

## 2022-08-18 MED ORDER — LIDOCAINE 2% (20 MG/ML) 5 ML SYRINGE
INTRAMUSCULAR | Status: AC
  Filled 2022-08-18: qty 5

## 2022-08-18 MED ORDER — CEFAZOLIN SODIUM-DEXTROSE 2-4 GM/100ML-% IV SOLN
INTRAVENOUS | Status: AC
  Filled 2022-08-18: qty 100

## 2022-08-18 MED ORDER — MIDAZOLAM HCL 2 MG/2ML IJ SOLN
INTRAMUSCULAR | Status: AC
  Filled 2022-08-18: qty 2

## 2022-08-18 MED ORDER — DEXAMETHASONE SODIUM PHOSPHATE 10 MG/ML IJ SOLN
INTRAMUSCULAR | Status: AC
  Filled 2022-08-18: qty 1

## 2022-08-18 MED ORDER — PROPOFOL 10 MG/ML IV BOLUS
INTRAVENOUS | Status: DC | PRN
  Administered 2022-08-18: 250 mg via INTRAVENOUS

## 2022-08-18 MED ORDER — FENTANYL CITRATE (PF) 100 MCG/2ML IJ SOLN
25.0000 ug | INTRAMUSCULAR | Status: DC | PRN
  Administered 2022-08-18 (×2): 50 ug via INTRAVENOUS

## 2022-08-18 MED ORDER — SODIUM CHLORIDE 0.9 % IR SOLN
Status: DC | PRN
  Administered 2022-08-18: 3000 mL

## 2022-08-18 MED ORDER — TISSEEL 10 ML EX KIT
PACK | CUTANEOUS | Status: DC | PRN
  Administered 2022-08-18: 1

## 2022-08-18 MED ORDER — EPHEDRINE SULFATE (PRESSORS) 50 MG/ML IJ SOLN
INTRAMUSCULAR | Status: DC | PRN
  Administered 2022-08-18 (×2): 10 mg via INTRAVENOUS

## 2022-08-18 MED ORDER — BUPIVACAINE HCL (PF) 0.25 % IJ SOLN
INTRAMUSCULAR | Status: AC
  Filled 2022-08-18: qty 30

## 2022-08-18 MED ORDER — ACETAMINOPHEN 500 MG PO TABS
ORAL_TABLET | ORAL | Status: AC
  Filled 2022-08-18: qty 2

## 2022-08-18 MED ORDER — CEFAZOLIN SODIUM-DEXTROSE 2-4 GM/100ML-% IV SOLN
2.0000 g | INTRAVENOUS | Status: AC
  Administered 2022-08-18: 2 g via INTRAVENOUS

## 2022-08-18 MED ORDER — LIDOCAINE 2% (20 MG/ML) 5 ML SYRINGE
INTRAMUSCULAR | Status: DC | PRN
  Administered 2022-08-18: 20 mg via INTRAVENOUS

## 2022-08-18 MED ORDER — ONDANSETRON HCL 4 MG/2ML IJ SOLN
INTRAMUSCULAR | Status: AC
  Filled 2022-08-18: qty 2

## 2022-08-18 MED ORDER — FENTANYL CITRATE (PF) 100 MCG/2ML IJ SOLN
INTRAMUSCULAR | Status: AC
  Filled 2022-08-18: qty 2

## 2022-08-18 MED ORDER — THROMBIN 5000 UNITS EX SOLR
CUTANEOUS | Status: AC
  Filled 2022-08-18: qty 5000

## 2022-08-18 MED ORDER — FENTANYL CITRATE (PF) 100 MCG/2ML IJ SOLN
100.0000 ug | Freq: Once | INTRAMUSCULAR | Status: AC
  Administered 2022-08-18: 100 ug via INTRAVENOUS

## 2022-08-18 MED ORDER — PROPOFOL 500 MG/50ML IV EMUL
INTRAVENOUS | Status: AC
  Filled 2022-08-18: qty 50

## 2022-08-18 MED ORDER — TRANEXAMIC ACID-NACL 1000-0.7 MG/100ML-% IV SOLN
INTRAVENOUS | Status: AC
  Filled 2022-08-18: qty 100

## 2022-08-18 MED ORDER — TRANEXAMIC ACID-NACL 1000-0.7 MG/100ML-% IV SOLN
1000.0000 mg | INTRAVENOUS | Status: AC
  Administered 2022-08-18: 1000 mg via INTRAVENOUS

## 2022-08-18 MED ORDER — VANCOMYCIN HCL 1000 MG IV SOLR
INTRAVENOUS | Status: AC
  Filled 2022-08-18: qty 20

## 2022-08-18 MED ORDER — OXYCODONE HCL 5 MG PO TABS
5.0000 mg | ORAL_TABLET | Freq: Once | ORAL | Status: AC
  Administered 2022-08-18: 5 mg via ORAL

## 2022-08-18 MED ORDER — KETAMINE HCL 10 MG/ML IJ SOLN
INTRAMUSCULAR | Status: DC | PRN
  Administered 2022-08-18: 30 mg via INTRAVENOUS

## 2022-08-18 MED ORDER — ONDANSETRON HCL 4 MG/2ML IJ SOLN
INTRAMUSCULAR | Status: DC | PRN
  Administered 2022-08-18: 4 mg via INTRAVENOUS

## 2022-08-18 MED ORDER — LACTATED RINGERS IV SOLN: INTRAVENOUS | Status: DC

## 2022-08-18 MED ORDER — DEXAMETHASONE SODIUM PHOSPHATE 10 MG/ML IJ SOLN
INTRAMUSCULAR | Status: DC | PRN
  Administered 2022-08-18: 10 mg via INTRAVENOUS

## 2022-08-18 SURGICAL SUPPLY — 105 items
ANCHOR BUTTON TIGHTROPE ACL RT (Orthopedic Implant) IMPLANT
ANCHOR PEEK 4.75X19.1 SWLK C (Anchor) IMPLANT
APL PRP STRL LF DISP 70% ISPRP (MISCELLANEOUS) ×1
BANDAGE ESMARK 6X9 LF (GAUZE/BANDAGES/DRESSINGS) IMPLANT
BLADE SHAVER BONE 5.0X13 (MISCELLANEOUS) IMPLANT
BLADE SHAVER TORPEDO 4X13 (MISCELLANEOUS) IMPLANT
BLADE SURG 15 STRL LF DISP TIS (BLADE) ×2 IMPLANT
BLADE SURG 15 STRL SS (BLADE) ×2
BNDG CMPR 5X4 CHSV STRCH STRL (GAUZE/BANDAGES/DRESSINGS)
BNDG CMPR 5X4 KNIT ELC UNQ LF (GAUZE/BANDAGES/DRESSINGS)
BNDG CMPR 5X62 HK CLSR LF (GAUZE/BANDAGES/DRESSINGS) ×1
BNDG CMPR 6"X 5 YARDS HK CLSR (GAUZE/BANDAGES/DRESSINGS) ×1
BNDG CMPR 9X6 STRL LF SNTH (GAUZE/BANDAGES/DRESSINGS)
BNDG COHESIVE 4X5 TAN STRL LF (GAUZE/BANDAGES/DRESSINGS) IMPLANT
BNDG ELASTIC 4INX 5YD STR LF (GAUZE/BANDAGES/DRESSINGS) ×1 IMPLANT
BNDG ELASTIC 6INX 5YD STR LF (GAUZE/BANDAGES/DRESSINGS) ×1 IMPLANT
BNDG ESMARK 6X9 LF (GAUZE/BANDAGES/DRESSINGS)
CHLORAPREP W/TINT 26 (MISCELLANEOUS) ×1 IMPLANT
COOLER ICEMAN CLASSIC (MISCELLANEOUS) ×1 IMPLANT
COVER BACK TABLE 60X90IN (DRAPES) ×1 IMPLANT
CUFF TOURN SGL QUICK 34 (TOURNIQUET CUFF)
CUFF TRNQT CYL 34X4.125X (TOURNIQUET CUFF) ×1 IMPLANT
CUTTER TENSIONER SUT 2-0 0 FBW (INSTRUMENTS) IMPLANT
DISSECTOR 4.0MM X 13CM (MISCELLANEOUS) ×1 IMPLANT
DRAPE EXTREMITY T 121X128X90 (DISPOSABLE) ×1 IMPLANT
DRAPE IMP U-DRAPE 54X76 (DRAPES) IMPLANT
DRAPE INCISE IOBAN 66X45 STRL (DRAPES) IMPLANT
DRAPE OEC MINIVIEW 54X84 (DRAPES) ×1 IMPLANT
DRAPE U-SHAPE 47X51 STRL (DRAPES) ×1 IMPLANT
DRAPE-T ARTHROSCOPY W/POUCH (DRAPES) ×1 IMPLANT
DRILL FLIPCUTTER III 6-12 (ORTHOPEDIC DISPOSABLE SUPPLIES) ×1 IMPLANT
DW OUTFLOW CASSETTE/TUBE SET (MISCELLANEOUS) ×1 IMPLANT
ELECT REM PT RETURN 9FT ADLT (ELECTROSURGICAL) ×1
ELECTRODE REM PT RTRN 9FT ADLT (ELECTROSURGICAL) ×1 IMPLANT
FIBERSTICK 2 (SUTURE) ×1 IMPLANT
FLIPCUTTER III 6-12 AR-1204FF (ORTHOPEDIC DISPOSABLE SUPPLIES)
GAUZE 4X4 16PLY ~~LOC~~+RFID DBL (SPONGE) IMPLANT
GAUZE PAD ABD 8X10 STRL (GAUZE/BANDAGES/DRESSINGS) ×1 IMPLANT
GAUZE SPONGE 4X4 12PLY STRL (GAUZE/BANDAGES/DRESSINGS) ×1 IMPLANT
GAUZE XEROFORM 1X8 LF (GAUZE/BANDAGES/DRESSINGS) ×1 IMPLANT
GLOVE BIO SURGEON STRL SZ 6 (GLOVE) ×2 IMPLANT
GLOVE BIO SURGEON STRL SZ7.5 (GLOVE) ×1 IMPLANT
GLOVE BIOGEL PI IND STRL 6.5 (GLOVE) ×1 IMPLANT
GLOVE BIOGEL PI IND STRL 8 (GLOVE) ×1 IMPLANT
GOWN STRL REUS W/ TWL LRG LVL3 (GOWN DISPOSABLE) ×2 IMPLANT
GOWN STRL REUS W/ TWL XL LVL3 (GOWN DISPOSABLE) ×1 IMPLANT
GOWN STRL REUS W/TWL LRG LVL3 (GOWN DISPOSABLE)
GOWN STRL REUS W/TWL XL LVL3 (GOWN DISPOSABLE) ×2 IMPLANT
HARVESTER TENDON QUADPRO 11 (ORTHOPEDIC DISPOSABLE SUPPLIES) IMPLANT
HARVESTER TENDON QUADPRO 8 (ORTHOPEDIC DISPOSABLE SUPPLIES) IMPLANT
HARVESTER TENDON QUADPRO 9 (ORTHOPEDIC DISPOSABLE SUPPLIES) IMPLANT
IMMOBILIZER KNEE 20 (SOFTGOODS)
IMMOBILIZER KNEE 20 THIGH 36 (SOFTGOODS) IMPLANT
IMMOBILIZER KNEE 22 UNIV (SOFTGOODS) IMPLANT
IMMOBILIZER KNEE 24 THIGH 36 (MISCELLANEOUS) IMPLANT
IMMOBILIZER KNEE 24 UNIV (MISCELLANEOUS)
IMPLANT QUADLINK SYSTEM 10 (Orthopedic Implant) IMPLANT
IV NS IRRIG 3000ML ARTHROMATIC (IV SOLUTION) ×4 IMPLANT
KIT ROOT REPAIR MEINISCAL PEEK (Anchor) IMPLANT
KIT TRANSTIBIAL (DISPOSABLE) ×1 IMPLANT
KNIFE GRAFT ACL 10MM 5952 (MISCELLANEOUS) IMPLANT
KNIFE GRAFT ACL 9MM (MISCELLANEOUS) IMPLANT
MACI AUTOLOGOUS CELL SCAFFOLD (Tissue) ×1 IMPLANT
MEINISCAL ROOT REPAIR KIT PEEK (Anchor) ×1 IMPLANT
NDL SUT 2-0 SCORPION KNEE (NEEDLE) ×1 IMPLANT
NEEDLE SUT 2-0 SCORPION KNEE (NEEDLE) IMPLANT
NS IRRIG 1000ML POUR BTL (IV SOLUTION) ×1 IMPLANT
PACK ARTHROSCOPY DSU (CUSTOM PROCEDURE TRAY) ×1 IMPLANT
PACK BASIN DAY SURGERY FS (CUSTOM PROCEDURE TRAY) ×1 IMPLANT
PAD CAST 4YDX4 CTTN HI CHSV (CAST SUPPLIES) ×1 IMPLANT
PAD COLD SHLDR WRAP-ON (PAD) ×1 IMPLANT
PADDING CAST COTTON 4X4 STRL (CAST SUPPLIES)
PADDING CAST COTTON 6X4 STRL (CAST SUPPLIES) ×1 IMPLANT
PENCIL SMOKE EVACUATOR (MISCELLANEOUS) ×1 IMPLANT
PORT APPOLLO RF 90DEGREE MULTI (SURGICAL WAND) ×1 IMPLANT
SCAFFOLD CELL AUTOLOGOUS MACI (Tissue) IMPLANT
SHEET MEDIUM DRAPE 40X70 STRL (DRAPES) ×1 IMPLANT
SLEEVE SCD COMPRESS KNEE MED (STOCKING) ×1 IMPLANT
SPIKE FLUID TRANSFER (MISCELLANEOUS) IMPLANT
SPONGE INTESTINAL PEANUT (DISPOSABLE) ×1 IMPLANT
SPONGE T-LAP 18X18 ~~LOC~~+RFID (SPONGE) ×2 IMPLANT
SPONGE T-LAP 4X18 ~~LOC~~+RFID (SPONGE) ×1 IMPLANT
SUT 0 FIBERLOOP 38 BLUE TPR ND (SUTURE) ×1
SUT 2 FIBERLOOP 20 STRT BLUE (SUTURE)
SUT CHROMIC 3 0 SH 27 (SUTURE) ×1 IMPLANT
SUT ETHILON 3 0 PS 1 (SUTURE) ×2 IMPLANT
SUT VIC AB 0 CT1 27 (SUTURE) ×2
SUT VIC AB 0 CT1 27XBRD ANBCTR (SUTURE) ×3 IMPLANT
SUT VIC AB 1 CT1 27 (SUTURE)
SUT VIC AB 1 CT1 27XBRD ANBCTR (SUTURE) ×1 IMPLANT
SUT VIC AB 2-0 CT1 27 (SUTURE) ×1
SUT VIC AB 2-0 CT1 TAPERPNT 27 (SUTURE) ×1 IMPLANT
SUT VIC AB 2-0 PS2 27 (SUTURE) ×2 IMPLANT
SUT VIC AB 3-0 SH 27 (SUTURE)
SUT VIC AB 3-0 SH 27X BRD (SUTURE) IMPLANT
SUTURE 0 FIBERLP 38 BLU TPR ND (SUTURE) ×1 IMPLANT
SUTURE 2 FIBERLOOP 20 STRT BLU (SUTURE) IMPLANT
SUTURE TAPE TIGERLINK 1.3MM BL (SUTURE) IMPLANT
SUTURETAPE TIGERLINK 1.3MM BL (SUTURE)
SYR BULB IRRIG 60ML STRL (SYRINGE) ×1 IMPLANT
SYS INTERNAL BRACE KNEE (Miscellaneous) IMPLANT
TOWEL GREEN STERILE FF (TOWEL DISPOSABLE) ×2 IMPLANT
TUBE CONNECTING 20X1/4 (TUBING) ×1 IMPLANT
TUBING ARTHROSCOPY IRRIG 16FT (MISCELLANEOUS) ×1 IMPLANT
YANKAUER SUCT BULB TIP NO VENT (SUCTIONS) ×1 IMPLANT

## 2022-08-18 NOTE — Anesthesia Procedure Notes (Signed)
Procedure Name: LMA Insertion Date/Time: 08/18/2022 7:48 AM  Performed by: Burna Cash, CRNAPre-anesthesia Checklist: Patient identified, Emergency Drugs available, Suction available and Patient being monitored Patient Re-evaluated:Patient Re-evaluated prior to induction Oxygen Delivery Method: Circle system utilized Preoxygenation: Pre-oxygenation with 100% oxygen Induction Type: IV induction Ventilation: Mask ventilation without difficulty LMA: LMA inserted LMA Size: 4.0 Number of attempts: 1 Airway Equipment and Method: Bite block Placement Confirmation: positive ETCO2 Tube secured with: Tape Dental Injury: Teeth and Oropharynx as per pre-operative assessment

## 2022-08-18 NOTE — Op Note (Signed)
Date of Surgery: 08/18/2022  INDICATIONS: Cheryl Tran is a 32 y.o.-year-old female with partial ACL tear as well as medial patella 1/2 x 2 cm chondral lesion.  The risk and benefits of the procedure were discussed in detail and documented in the pre-operative evaluation.   PREOPERATIVE DIAGNOSIS: 1.  Right knee anterior cruciate ligament partial tear 2.  Right knee 1-1/2 x 2 cm medial patella chondral lesion  POSTOPERATIVE DIAGNOSIS: Same.  PROCEDURE: 1.  Right knee anterior cruciate ligament repair 2. Right knee medial patella matrix associated chondrocyte implantation 1-1/2 x 2 cm lesion  SURGEON: Benancio Deeds MD  ASSISTANT: Barry Brunner  ANESTHESIA:  general plus adductor canal block  IV FLUIDS AND URINE: See anesthesia record.  ANTIBIOTICS: Ancef  ESTIMATED BLOOD LOSS: 15 mL.  IMPLANTS:  Implant Name Type Inv. Item Serial No. Manufacturer Lot No. LRB No. Used Action  [employer] CE[employer] SCAFFOLD - [id number] Tissue [employer] SCAFFOLD  VERICEL [id number] Right 1 Implanted  TISSEEL FIBRIN SEALANT   [id number] [employer] [id number] Right 1 Implanted  MEINISCAL ROOT REPAIR KIT PEEK - [id number] Anchor MEINISCAL ROOT REPAIR KIT PEEK  [id number] Right 1 Implanted    DRAINS: None  CULTURES: None  COMPLICATIONS: none  DESCRIPTION OF PROCEDURE:   Examination under anesthesia: A careful examination under anesthesia was performed.  Knee ROM motion was: -2-130 Lachman: Positive Pivot Shift: Normal Posterior drawer: normal.   Varus stability in full extension: normal.   Varus stability in 30 degrees of flexion: normal.  Valgus stability in full extension: normal.   Valgus stability in 30 degrees of flexion: normal.  Posterolateral drawer: normal   Intra-operative findings: A thorough arthroscopic examination of the knee was performed.  The findings are: 1. Suprapatellar pouch: Normal 2. Undersurface of median ridge: Normal 3.  Medial patellar facet: 1-1/2 x 2 cm full-thickness chondral lesion 4. Lateral patellar facet: Normal 5. Trochlea: Normal 6. Lateral gutter/popliteus tendon: Normal 7. Hoffa's fat pad: Normal 8. Medial gutter/plica: Normal 9. ACL: Normal 10. PCL: Normal 11. Medial meniscus: Normal 12. Medial compartment cartilage: Normal 13. Lateral meniscus: Normal 14. Lateral compartment cartilage: Normal  I identified the patient in the pre-operative holding area.  I marked the operative knee with my initials. I reviewed the risks and benefits of the proposed surgical intervention and the patient wished to proceed.  Anesthesia performed a peripheral nerve block.  Patient was subsequently taken back to the operating room.  The patient was transferred to the operative suite and placed in the supine position with all bony prominences padded.     SCDs were placed on the non-operative lower extremity. Appropriate antibiotics was administered within 1 hour before incision. The operative lower extremity was then prepped and draped in standard fashion. A time out was performed confirming the correct extremity, correct patient and correct procedure.    A standard anterolateral portal was made with an 11 blade.  The ideal position for the anteromedial portal was established using a spinal needle.  This AM portal was then created under direct visualization with an 11 blade.  A full diagnostic arthroscopy was then performed, as described above, including probing of the chondral and meniscal surfaces.     The anterior medial bundle of the anterior cruciate ligament was found to be torn off the wall and scarred into the septum.  As result the portion of the proximal ACL was tagged with 2 fiber tags and brought into a 4.75 mm swivel lock into  the native femoral insertion of the anterior medial bundle.  This had excellent retention with a subsequently negative Lachman.  At this time attention was turned to the chondrocyte  implantation.  The previous medial portal and outflow portal were connected to create a medial parapatellar approach.  15 blade was used to incise through skin.  Electrocautery was used to Bovie down to the layer of the retinaculum.  A medial parapatellar approach was used with opening the retinaculum with Metzenbaum scissors.  2 K wires were placed into the medial aspect of the patella and these were used to lever and gain access to the medial chondral lesion.  At this time the small circular patella guide was used from the varicel kit.  This was malleted into place.  A subsequent ring curette was used to excise all nonviable cartilage and fibrocartilage down to the level of the subchondral bone.  There was no significant bleeding.  A layer of Tisseel was then applied to the base.  A small circular punch was then used onto the cartilage membrane and this was transferred into the corresponding shape onto the patella.  A peripheral rim of Tisseel was again applied.  A peanut was used to firmly apply pressure to the chondrocyte sheet.  This had good apposition and filled the previous circular defect.  That concluded the case.  Skin was closed with 2-0 Vicryl and 3-0 nylon. Xeroform gauze, gauze, Tegaderm, Iceman and brace were applied.  Instrument, sponge, and needle counts were correct prior to wound closure and at the conclusion of the case.  The patient was taken to the PACU without complication    POSTOPERATIVE PLAN: She will be nonweightbearing for a total of 2 weeks.  I will plan to see her back in 2 weeks for suture removal.  She will begin physical therapy immediately postop  Benancio Deeds, MD 9:25 AM

## 2022-08-18 NOTE — Progress Notes (Signed)
Assisted Dr. Woodrum with right, adductor canal, ultrasound guided block. Side rails up, monitors on throughout procedure. See vital signs in flow sheet. Tolerated Procedure well. 

## 2022-08-18 NOTE — Transfer of Care (Signed)
Immediate Anesthesia Transfer of Care Note  Patient: Cheryl Tran  Procedure(s) Performed: RIGHT KNEE ARTHROSCOPY WITH ANTERIOR CRUCIATE LIGAMENT (ACL) REPAIR (Right: Knee) OSTEOCHONDRAL DEFECT REPAIR/RECONSTRUCTION/MACI IMPLANTATION (Right: Knee)  Patient Location: PACU  Anesthesia Type:GA combined with regional for post-op pain  Level of Consciousness: sedated  Airway & Oxygen Therapy: Patient Spontanous Breathing and Patient connected to face mask oxygen  Post-op Assessment: Report given to RN and Post -op Vital signs reviewed and stable  Post vital signs: Reviewed and stable  Last Vitals:  Vitals Value Taken Time  BP 112/76 08/18/22 0917  Temp 36.2 C 08/18/22 0917  Pulse 55 08/18/22 0918  Resp 13 08/18/22 0918  SpO2 100 % 08/18/22 0918  Vitals shown include unvalidated device data.  Last Pain:  Vitals:   08/18/22 0642  TempSrc: Oral  PainSc: 0-No pain      Patients Stated Pain Goal: 4 (08/18/22 5784)  Complications: No notable events documented.

## 2022-08-18 NOTE — Discharge Instructions (Addendum)
Discharge Instructions    Attending Surgeon: Huel Cote, MD Office Phone Number: [phone]   Diagnosis and Procedures:    Surgeries Performed: Right leg ACL repair with MACI implantation  Discharge Plan:    Diet: Resume usual diet. Begin with light or bland foods.  Drink plenty of fluids.  Activity:  Touchdown weight bearing 2 weeks. You are advised to go home directly from the hospital or surgical center. Restrict your activities.  GENERAL INSTRUCTIONS: 1.  Please apply ice to your wound to help with swelling and inflammation. This will improve your comfort and your overall recovery following surgery.     2. Please call Dr. Serena Croissant office at [phone] with questions Monday-Friday during business hours. If no one answers, please leave a message and someone should get back to the patient within 24 hours. For emergencies please call 911 or proceed to the emergency room.   3. Patient to notify surgical team if experiences any of the following: Bowel/Bladder dysfunction, uncontrolled pain, nerve/muscle weakness, incision with increased drainage or redness, nausea/vomiting and Fever greater than 101.0 F.  Be alert for signs of infection including redness, streaking, odor, fever or chills. Be alert for excessive pain or bleeding and notify your surgeon immediately.  WOUND INSTRUCTIONS:   Leave your dressing, cast, or splint in place until your post operative visit.  Keep it clean and dry.  Always keep the incision clean and dry until the staples/sutures are removed. If there is no drainage from the incision you should keep it open to air. If there is drainage from the incision you must keep it covered at all times until the drainage stops  Do not soak in a bath tub, hot tub, pool, lake or other body of water until 21 days after your surgery and your incision is completely dry and healed.  If you have removable sutures (or staples) they must be removed 10-14 days  (unless otherwise instructed) from the day of your surgery.     1)  Elevate the extremity as much as possible.  2)  Keep the dressing clean and dry.  3)  Please call us if the dressing becomes wet or dirty.  4)  If you are experiencing worsening pain or worsening swelling, please call.     MEDICATIONS: Resume all previous home medications at the previous prescribed dose and frequency unless otherwise noted Start taking the  pain medications on an as-needed basis as prescribed  Please taper down pain medication over the next week following surgery.  Ideally you should not require a refill of any narcotic pain medication.  Take pain medication with food to minimize nausea. In addition to the prescribed pain medication, you may take over-the-counter pain relievers such as Tylenol.  Do NOT take additional tylenol if your pain medication already has tylenol in it.  Aspirin 325mg  daily for four weeks.      FOLLOWUP INSTRUCTIONS: 1. Follow up at the Physical Therapy Clinic 3-4 days following surgery. This appointment should be scheduled unless other arrangements have been made.The Physical Therapy scheduling number is [phone] if an appointment has not already been arranged.  2. Contact Dr. Serena Croissant office during office hours at [phone] or the practice after hours line at [phone] for non-emergencies. For medical emergencies call 911.   Discharge Location: Home     Post Anesthesia Home Care Instructions  Activity: Get plenty of rest for the remainder of the day. A responsible individual must stay with you for 24  hours following the procedure.  For the next 24 hours, DO NOT: -Drive a car -Advertising copywriter -Drink alcoholic beverages -Take any medication unless instructed by your physician -Make any legal decisions or sign important papers.  Meals: Start with liquid foods such as gelatin or soup. Progress to regular foods as tolerated. Avoid greasy, spicy, heavy foods.  If nausea and/or vomiting occur, drink only clear liquids until the nausea and/or vomiting subsides. Call your physician if vomiting continues.  Special Instructions/Symptoms: Your throat may feel dry or sore from the anesthesia or the breathing tube placed in your throat during surgery. If this causes discomfort, gargle with warm salt water. The discomfort should disappear within 24 hours.   Regional Anesthesia Blocks  1. Numbness or the inability to move the "blocked" extremity may last from 3-48 hours after placement. The length of time depends on the medication injected and your individual response to the medication. If the numbness is not going away after 48 hours, call your surgeon.  2. The extremity that is blocked will need to be protected until the numbness is gone and the  Strength has returned. Because you cannot feel it, you will need to take extra care to avoid injury. Because it may be weak, you may have difficulty moving it or using it. You may not know what position it is in without looking at it while the block is in effect.  3. For blocks in the legs and feet, returning to weight bearing and walking needs to be done carefully. You will need to wait until the numbness is entirely gone and the strength has returned. You should be able to move your leg and foot normally before you try and bear weight or walk. You will need someone to be with you when you first try to ensure you do not fall and possibly risk injury.  4. Bruising and tenderness at the needle site are common side effects and will resolve in a few days.  5. Persistent numbness or new problems with movement should be communicated to the surgeon or the [facility] [phone] [facility] [phone]).     Next dose of Tylenol may be given at 12:45pm if needed.

## 2022-08-18 NOTE — Anesthesia Preprocedure Evaluation (Addendum)
Anesthesia Evaluation  Patient identified by MRN, date of birth, ID band Patient awake    Reviewed: Allergy & Precautions, NPO status , Patient's Chart, lab work & pertinent test results  Airway Mallampati: I  TM Distance: >3 FB Neck ROM: Full    Dental  (+) Dental Advisory Given, Chipped,    Pulmonary asthma , Current Smoker and Patient abstained from smoking.   Pulmonary exam normal breath sounds clear to auscultation       Cardiovascular negative cardio ROS Normal cardiovascular exam Rhythm:Regular Rate:Normal     Neuro/Psych  PSYCHIATRIC DISORDERS Anxiety Depression Bipolar Disorder   negative neurological ROS     GI/Hepatic negative GI ROS, Neg liver ROS,,,  Endo/Other  PCOS  Renal/GU negative Renal ROS  negative genitourinary   Musculoskeletal negative musculoskeletal ROS (+)    Abdominal   Peds  Hematology negative hematology ROS (+)   Anesthesia Other Findings   Reproductive/Obstetrics                             Anesthesia Physical Anesthesia Plan  ASA: 2  Anesthesia Plan: General and Regional   Post-op Pain Management: Regional block*, Tylenol PO (pre-op)* and Ketamine IV*   Induction: Intravenous  PONV Risk Score and Plan: 2 and Ondansetron, Dexamethasone and Midazolam  Airway Management Planned: LMA  Additional Equipment:   Intra-op Plan:   Post-operative Plan: Extubation in OR  Informed Consent: I have reviewed the patients History and Physical, chart, labs and discussed the procedure including the risks, benefits and alternatives for the proposed anesthesia with the patient or authorized representative who has indicated his/her understanding and acceptance.     Dental advisory given  Plan Discussed with: CRNA  Anesthesia Plan Comments:        Anesthesia Quick Evaluation

## 2022-08-18 NOTE — Interval H&P Note (Signed)
History and Physical Interval Note:  08/18/2022 6:52 AM  Cheryl Tran  has presented today for surgery, with the diagnosis of RIGHT PATELLACHONDRAL LESION AND LIGAMENTOUS LAXITY.  The various methods of treatment have been discussed with the patient and family. After consideration of risks, benefits and other options for treatment, the patient has consented to  Procedure(s): RIGHT KNEE ARTHROSCOPY WITH ANTERIOR CRUCIATE LIGAMENT (ACL) REPAIR / MACI CARTILAGE IMPLANTATION (Right) OSTEOCHONDRAL DEFECT REPAIR/RECONSTRUCTION (Right) as a surgical intervention.  The patient's history has been reviewed, patient examined, no change in status, stable for surgery.  I have reviewed the patient's chart and labs.  Questions were answered to the patient's satisfaction.     Huel Cote
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
## 2022-08-18 NOTE — Brief Op Note (Signed)
   Brief Op Note  Date of Surgery: 08/18/2022  Preoperative Diagnosis: RIGHT PATELLACHONDRAL LESION AND LIGAMENTOUS LAXITY  Postoperative Diagnosis: same  Procedure: Procedure(s): RIGHT KNEE ARTHROSCOPY WITH ANTERIOR CRUCIATE LIGAMENT (ACL) REPAIR OSTEOCHONDRAL DEFECT REPAIR/RECONSTRUCTION/MACI IMPLANTATION  Implants: Implant Name Type Inv. Item Serial No. Manufacturer Lot No. LRB No. Used Action  MACI AUTOLOGOUS CELL SCAFFOLD - [id number] Tissue MACI AUTOLOGOUS CELL SCAFFOLD  VERICEL [id number] Right 1 Implanted  TISSEEL FIBRIN SEALANT   [id number] BAXTER [employer]ENCE [id number] Right 1 Implanted  MEINISCAL ROOT REPAIR KIT PEEK - [id number] Anchor MEINISCAL ROOT REPAIR KIT PEEK  [employer] [id number] Right 1 Implanted    Surgeons: Surgeon(s): Huel Cote, MD  Anesthesia: Regional    Estimated Blood Loss: See anesthesia record  Complications: None  Condition to PACU: Stable  Benancio Deeds, MD 08/18/2022 9:25 AM

## 2022-08-19 ENCOUNTER — Telehealth: Payer: Self-pay | Admitting: Orthopaedic Surgery

## 2022-08-19 ENCOUNTER — Other Ambulatory Visit (HOSPITAL_BASED_OUTPATIENT_CLINIC_OR_DEPARTMENT_OTHER): Payer: Self-pay | Admitting: Orthopaedic Surgery

## 2022-08-19 ENCOUNTER — Encounter (HOSPITAL_BASED_OUTPATIENT_CLINIC_OR_DEPARTMENT_OTHER): Payer: Self-pay | Admitting: Orthopaedic Surgery

## 2022-08-19 MED ORDER — ASPIRIN 325 MG PO TBEC
325.0000 mg | DELAYED_RELEASE_TABLET | Freq: Every day | ORAL | 0 refills | Status: DC
Start: 2022-08-19 — End: 2023-02-08

## 2022-08-19 MED ORDER — OXYCODONE HCL 5 MG PO TABS: 5.0000 mg | ORAL_TABLET | ORAL | 0 refills | Status: DC | PRN

## 2022-08-19 MED ORDER — ROPIVACAINE HCL 5 MG/ML IJ SOLN
INTRAMUSCULAR | Status: DC | PRN
  Administered 2022-08-18: 30 mL via PERINEURAL

## 2022-08-19 MED ORDER — DEXAMETHASONE SODIUM PHOSPHATE 10 MG/ML IJ SOLN
INTRAMUSCULAR | Status: DC | PRN
  Administered 2022-08-18: 10 mg

## 2022-08-19 MED ORDER — IBUPROFEN 800 MG PO TABS: 800.0000 mg | ORAL_TABLET | Freq: Three times a day (TID) | ORAL | 0 refills | Status: AC

## 2022-08-19 MED ORDER — ACETAMINOPHEN 500 MG PO TABS: 500.0000 mg | ORAL_TABLET | Freq: Three times a day (TID) | ORAL | 0 refills | Status: AC

## 2022-08-19 NOTE — Telephone Encounter (Signed)
Patient requested pain medication sent to [facility] on [address]

## 2022-08-19 NOTE — Telephone Encounter (Signed)
Called and advised.

## 2022-08-19 NOTE — Anesthesia Postprocedure Evaluation (Signed)
Anesthesia Post Note  Patient: Naje Artis Flock  Procedure(s) Performed: RIGHT KNEE ARTHROSCOPY WITH ANTERIOR CRUCIATE LIGAMENT (ACL) REPAIR (Right: Knee) OSTEOCHONDRAL DEFECT REPAIR/RECONSTRUCTION/MACI IMPLANTATION (Right: Knee)     Patient location during evaluation: PACU Anesthesia Type: Regional and General Level of consciousness: awake and alert Pain management: pain level controlled Vital Signs Assessment: post-procedure vital signs reviewed and stable Respiratory status: spontaneous breathing, nonlabored ventilation, respiratory function stable and patient connected to nasal cannula oxygen Cardiovascular status: blood pressure returned to baseline and stable Postop Assessment: no apparent nausea or vomiting Anesthetic complications: no  No notable events documented.  Last Vitals:  Vitals:   08/18/22 0952 08/18/22 1022  BP: 124/81 (!) 133/99  Pulse: 61 66  Resp: 14 18  Temp:  (!) 36.2 C  SpO2: 100% 100%    Last Pain:  Vitals:   08/18/22 1022  TempSrc: Temporal  PainSc:    Pain Goal: Patients Stated Pain Goal: 4 (08/18/22 5366)                 Aliani Caccavale L Aianna Fahs

## 2022-08-19 NOTE — Anesthesia Procedure Notes (Deleted)
Anesthesia Regional Block: Adductor canal block   Pre-Anesthetic Checklist: , timeout performed,  Correct Patient, Correct Site, Correct Laterality,  Correct Procedure, Correct Position, site marked,  Risks and benefits discussed,  Pre-op evaluation,  At surgeon's request and post-op pain management  Laterality: Right  Prep: Maximum Sterile Barrier Precautions used, chloraprep       Needles:  Injection technique: Single-shot  Needle Type: Echogenic Stimulator Needle     Needle Length: 9cm  Needle Gauge: 21     Additional Needles:   Procedures:,,,, ultrasound used (permanent image in chart),,    Narrative:  Start time: 08/18/2022 7:10 AM End time: 08/18/2022 7:15 AM Injection made incrementally with aspirations every 5 mL. Anesthesiologist: Jensen Kilburg L, MD     

## 2022-08-19 NOTE — Anesthesia Procedure Notes (Signed)
Anesthesia Regional Block: Adductor canal block   Pre-Anesthetic Checklist: , timeout performed,  Correct Patient, Correct Site, Correct Laterality,  Correct Procedure, Correct Position, site marked,  Risks and benefits discussed,  Pre-op evaluation,  At surgeon's request and post-op pain management  Laterality: Right  Prep: Maximum Sterile Barrier Precautions used, chloraprep       Needles:  Injection technique: Single-shot  Needle Type: Echogenic Stimulator Needle     Needle Length: 9cm  Needle Gauge: 21     Additional Needles:   Procedures:,,,, ultrasound used (permanent image in chart),,    Narrative:  Start time: 08/18/2022 7:10 AM End time: 08/18/2022 7:15 AM Injection made incrementally with aspirations every 5 mL. Anesthesiologist: Elmer Picker, MD

## 2022-08-21 ENCOUNTER — Encounter (HOSPITAL_BASED_OUTPATIENT_CLINIC_OR_DEPARTMENT_OTHER): Payer: Self-pay | Admitting: Physical Therapy

## 2022-08-21 ENCOUNTER — Ambulatory Visit (HOSPITAL_BASED_OUTPATIENT_CLINIC_OR_DEPARTMENT_OTHER): Payer: Medicaid Other | Attending: Orthopaedic Surgery | Admitting: Physical Therapy

## 2022-08-21 ENCOUNTER — Other Ambulatory Visit: Payer: Self-pay

## 2022-08-21 DIAGNOSIS — M2391 Unspecified internal derangement of right knee: Secondary | ICD-10-CM | POA: Insufficient documentation

## 2022-08-21 DIAGNOSIS — M25661 Stiffness of right knee, not elsewhere classified: Secondary | ICD-10-CM | POA: Insufficient documentation

## 2022-08-21 DIAGNOSIS — M6281 Muscle weakness (generalized): Secondary | ICD-10-CM | POA: Insufficient documentation

## 2022-08-21 DIAGNOSIS — R262 Difficulty in walking, not elsewhere classified: Secondary | ICD-10-CM | POA: Diagnosis present

## 2022-08-21 DIAGNOSIS — M25561 Pain in right knee: Secondary | ICD-10-CM | POA: Insufficient documentation

## 2022-08-21 NOTE — Therapy (Signed)
OUTPATIENT PHYSICAL THERAPY EVALUATION   Patient Name: Cheryl Tran MRN: [id number] DOB:[birth year], 32 y.o., female Today's Date: 08/21/2022  END OF SESSION:  PT End of Session - 08/21/22 1157     Visit Number 1    Number of Visits 33    Date for PT Re-Evaluation 12/11/22    Authorization Type [employer]    PT Start Time 1155    PT Stop Time 1230    PT Time Calculation (min) 35 min    Activity Tolerance Patient tolerated treatment well    Behavior During Therapy WFL for tasks assessed/performed             Past Medical History:  Diagnosis Date   Anxiety    Asthma    Bipolar 1 disorder (HCC)    Depression    Past Surgical History:  Procedure Laterality Date   KNEE ARTHROSCOPY WITH ANTERIOR CRUCIATE LIGAMENT (ACL) REPAIR Right 08/18/2022   Procedure: RIGHT KNEE ARTHROSCOPY WITH ANTERIOR CRUCIATE LIGAMENT (ACL) REPAIR;  Surgeon: Huel Cote, MD;  Location: [facility];  Service: Orthopedics;  Laterality: Right;   KNEE ARTHROSCOPY WITH MACI CARTILAGE HARVEST Right 06/22/2022   Procedure: RIGHT KNEE ARTHROSCOPY PATELLA CHONDROPLASTY  WITH MACI CARTILAGE HARVEST;  Surgeon: Huel Cote, MD;  Location: [facility];  Service: Orthopedics;  Laterality: Right;   OSTEOCHONDRAL DEFECT REPAIR/RECONSTRUCTION Right 08/18/2022   Procedure: OSTEOCHONDRAL DEFECT REPAIR/RECONSTRUCTION/MACI IMPLANTATION;  Surgeon: Huel Cote, MD;  Location: [facility];  Service: Orthopedics;  Laterality: Right;   Patient Active Problem List   Diagnosis Date Noted   Chondral defect of right patella 08/18/2022   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, right 08/18/2022   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 06/22/2022   Bipolar 1 disorder, mixed, moderate (HCC) 05/12/2022   PTSD (post-traumatic stress disorder) 05/12/2022   Anxiety state 05/12/2022   PCOS (polycystic ovarian syndrome) 04/29/2022   Asthma 03/04/1990     REFERRING PROVIDER: Steward Drone,  MD  REFERRING DIAG:   M23.91 (ICD-10-CM) - Ligamentous laxity of right knee  RIGHT KNEE ARTHROSCOPY WITH ANTERIOR CRUCIATE LIGAMENT (ACL) REPAIR / MACI CARTILAGE IMPLANTATION  Rationale for Evaluation and Treatment: Rehabilitation  THERAPY DIAG:  Acute pain of right knee  Stiffness of right knee, not elsewhere classified  Muscle weakness (generalized)  Difficulty in walking, not elsewhere classified  ONSET DATE: DOS 4/30   SUBJECTIVE:                                                                                                                                                                                           SUBJECTIVE STATEMENT: It is really  hurting.  PERTINENT HISTORY:  PTDS, currently moving, anxiety  PAIN:  Are you having pain? Yes: NPRS scale: pretty high/10 Pain location: right knee Pain description: sore Aggravating factors: constant Relieving factors: ice  PRECAUTIONS: Knee  WEIGHT BEARING RESTRICTIONS: Yes WBAT at 2wk  FALLS:  Has patient fallen in last 6 months? No  PATIENT GOALS: walk, stairs, decr pain   OBJECTIVE:   PATIENT SURVEYS:  LEFS 10/80    GAIT: EVAL: arrived in [facility] with bil axillary crutches   Body Part #1 Knee  PALPATION: EVAL: incision well- healing, good patellar mobility  LE MMT LOWER EXTREMITY MMT:  at eval- able to demo slight twitch in quad activation  MMT Right eval   Hip flexion    Hip extension    Hip abduction    Hip adduction    Hip internal rotation    Hip external rotation    Knee flexion    Knee extension    Ankle dorsiflexion    Ankle plantarflexion    Ankle inversion    Ankle eversion     (Blank rows = not tested)      TREATMENT:                                                                                                                               DATE: EVAL 08/21/22  Changed bandages Quad set Fit brace Gait training with bil axillary crutches    PATIENT EDUCATION:   Education details: Anatomy of condition, POC, HEP, exercise form/rationale Person educated: Patient Education method: Explanation, Demonstration, Tactile cues, Verbal cues, and Handouts Education comprehension: verbalized understanding, returned demonstration, verbal cues required, tactile cues required, and needs further education  HOME EXERCISE PROGRAM: Quad set, elevate   ASSESSMENT:  CLINICAL IMPRESSION: Patient is a 32 y.o. F who was seen today for physical therapy evaluation and treatment for s/p ACL repair.    OBJECTIVE IMPAIRMENTS: Abnormal gait, decreased activity tolerance, decreased balance, decreased knowledge of use of DME, difficulty walking, decreased ROM, decreased strength, increased edema, increased muscle spasms, impaired flexibility, impaired sensation, improper body mechanics, postural dysfunction, and pain.   ACTIVITY LIMITATIONS: carrying, lifting, bending, sitting, standing, squatting, stairs, transfers, bed mobility, bathing, dressing, and locomotion level  PARTICIPATION LIMITATIONS: meal prep, cleaning, laundry, driving, shopping, and community activity  PERSONAL FACTORS: 3+ comorbidities: see PMH  are also affecting patient's functional outcome.   REHAB POTENTIAL: Good  CLINICAL DECISION MAKING: Stable/uncomplicated  EVALUATION COMPLEXITY: Low   GOALS: Goals reviewed with patient? Yes  SHORT TERM GOALS: Target date: 5/31  Advance to full wB pain <=3/10 Baseline:NWB at eval Goal status: INITIAL  2.  SLR without extension lag Baseline: unable at eval Goal status: INITIAL  3.  Knee ROM 0-90 without pain Baseline: unable at eval Goal status: INITIAL   LONG TERM GOALS: Target date: POC date  LEFS to at least 60/80 Baseline: see obj Goal status: INITIAL  2.  Able to navigate  stairs with proper form Baseline: unable at eval Goal status: INITIAL  3.  MMT hip & knee to age-appropriate levels with hand held dyno Baseline: not appropriate  to test at eval Goal status: INITIAL  4.  Single leg balance control, pain <2/10, on stable and unstable surfaces Baseline: unable at eval Goal status: INITIAL  5.  Prepared to return to running & begin gentle plyometric program Baseline: will progress as appropraite Goal status: INITIAL   PLAN:  PT FREQUENCY: 1-2x/week  PT DURATION: through POC date  PLANNED INTERVENTIONS: Therapeutic exercises, Therapeutic activity, Neuromuscular re-education, Balance training, Gait training, Patient/Family education, Self Care, Joint mobilization, Stair training, DME instructions, Aquatic Therapy, Dry Needling, Electrical stimulation, Spinal mobilization, Cryotherapy, Moist heat, scar mobilization, Taping, Manual therapy, and Re-evaluation.  PLAN FOR NEXT SESSION: continue per protocol   Jahmez Bily C. Shoichi Mielke PT, DPT 08/21/22 2:10 PM   Check all possible CPT codes: 16109 - PT Re-evaluation, 97110- Therapeutic Exercise, [phone]- Neuro Re-education, [phone] - Gait Training, [phone] - Manual Therapy, 97530 - Therapeutic Activities, [phone] - Self Care, [phone] - Electrical stimulation (unattended), [phone] - Physical performance training, and [phone] - Aquatic therapy    Check all conditions that are expected to impact treatment: {Conditions expected to impact treatment:None of these apply   If treatment provided at initial evaluation, no treatment charged due to lack of authorization.

## 2022-08-24 ENCOUNTER — Telehealth (HOSPITAL_BASED_OUTPATIENT_CLINIC_OR_DEPARTMENT_OTHER): Payer: Self-pay | Admitting: Physical Therapy

## 2022-08-24 NOTE — Telephone Encounter (Signed)
Spoke with pt regarding PT appt this week. Offered appt on 5/9 at 1145 and on 5/11 multiple slot options. Pt states she will call us back if she can figure out the ride.  Solomia Harrell C. Alexiz Sustaita PT, DPT 08/24/22 12:33 PM

## 2022-08-27 ENCOUNTER — Ambulatory Visit (HOSPITAL_BASED_OUTPATIENT_CLINIC_OR_DEPARTMENT_OTHER): Payer: Medicaid Other

## 2022-08-27 ENCOUNTER — Encounter (HOSPITAL_BASED_OUTPATIENT_CLINIC_OR_DEPARTMENT_OTHER): Payer: Self-pay

## 2022-08-27 ENCOUNTER — Other Ambulatory Visit (HOSPITAL_COMMUNITY): Payer: Self-pay

## 2022-08-27 DIAGNOSIS — M25661 Stiffness of right knee, not elsewhere classified: Secondary | ICD-10-CM

## 2022-08-27 DIAGNOSIS — M25561 Pain in right knee: Secondary | ICD-10-CM

## 2022-08-27 DIAGNOSIS — M6281 Muscle weakness (generalized): Secondary | ICD-10-CM

## 2022-08-27 DIAGNOSIS — R262 Difficulty in walking, not elsewhere classified: Secondary | ICD-10-CM

## 2022-08-27 NOTE — Therapy (Signed)
OUTPATIENT PHYSICAL THERAPY EVALUATION   Patient Name: Cheryl Tran MRN: [id number] DOB:15-Jan-1991, 32 y.o., female Today's Date: 08/27/2022  END OF SESSION:  PT End of Session - 08/27/22 0949     Visit Number 2    Number of Visits 33    Date for PT Re-Evaluation 12/11/22    Authorization Type [employer]    PT Start Time 0955    PT Stop Time 1043    PT Time Calculation (min) 48 min    Activity Tolerance Patient tolerated treatment well    Behavior During Therapy WFL for tasks assessed/performed              Past Medical History:  Diagnosis Date   Anxiety    Asthma    Bipolar 1 disorder (HCC)    Depression    Past Surgical History:  Procedure Laterality Date   KNEE ARTHROSCOPY WITH ANTERIOR CRUCIATE LIGAMENT (ACL) REPAIR Right 08/18/2022   Procedure: RIGHT KNEE ARTHROSCOPY WITH ANTERIOR CRUCIATE LIGAMENT (ACL) REPAIR;  Surgeon: Huel Cote, MD;  Location: [facility];  Service: Orthopedics;  Laterality: Right;   KNEE ARTHROSCOPY WITH MACI CARTILAGE HARVEST Right 06/22/2022   Procedure: RIGHT KNEE ARTHROSCOPY PATELLA CHONDROPLASTY  WITH MACI CARTILAGE HARVEST;  Surgeon: Huel Cote, MD;  Location: [facility];  Service: Orthopedics;  Laterality: Right;   OSTEOCHONDRAL DEFECT REPAIR/RECONSTRUCTION Right 08/18/2022   Procedure: OSTEOCHONDRAL DEFECT REPAIR/RECONSTRUCTION/MACI IMPLANTATION;  Surgeon: Huel Cote, MD;  Location: [facility];  Service: Orthopedics;  Laterality: Right;   Patient Active Problem List   Diagnosis Date Noted   Chondral defect of right patella 08/18/2022   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, right 08/18/2022   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 06/22/2022   Bipolar 1 disorder, mixed, moderate (HCC) 05/12/2022   PTSD (post-traumatic stress disorder) 05/12/2022   Anxiety state 05/12/2022   PCOS (polycystic ovarian syndrome) 04/29/2022   Asthma 03/04/1990     REFERRING PROVIDER: Steward Drone,  MD  REFERRING DIAG:   M23.91 (ICD-10-CM) - Ligamentous laxity of right knee  RIGHT KNEE ARTHROSCOPY WITH ANTERIOR CRUCIATE LIGAMENT (ACL) REPAIR / MACI CARTILAGE IMPLANTATION  Rationale for Evaluation and Treatment: Rehabilitation  THERAPY DIAG:  Acute pain of right knee  Muscle weakness (generalized)  Difficulty in walking, not elsewhere classified  Stiffness of right knee, not elsewhere classified  ONSET DATE: DOS 4/30   SUBJECTIVE:                                                                                                                                                                                           SUBJECTIVE STATEMENT: Pt reports  significant pain level. 7/10 constant pain level. Has been using bilateral crutches. Arrives to clinic placing weight through RLE.   PERTINENT HISTORY:  PTDS, currently moving, anxiety  PAIN:  Are you having pain? Yes: NPRS scale: pretty high/10 Pain location: right knee Pain description: sore Aggravating factors: constant Relieving factors: ice  PRECAUTIONS: Knee  WEIGHT BEARING RESTRICTIONS: Yes WBAT at 2wk  FALLS:  Has patient fallen in last 6 months? No  PATIENT GOALS: walk, stairs, decr pain   OBJECTIVE:   PATIENT SURVEYS:  LEFS 10/80    GAIT: EVAL: arrived in [facility] with bil axillary crutches   Body Part #1 Knee  PALPATION: EVAL: incision well- healing, good patellar mobility  LE MMT LOWER EXTREMITY MMT:  at eval- able to demo slight twitch in quad activation  MMT Right eval   Hip flexion    Hip extension    Hip abduction    Hip adduction    Hip internal rotation    Hip external rotation    Knee flexion    Knee extension    Ankle dorsiflexion    Ankle plantarflexion    Ankle inversion    Ankle eversion     (Blank rows = not tested)      TREATMENT:                                                                                                                                 5/9: -Changed bandages -Quad set x20 -PROM R knee -Glute sets 5" x15 -Assisted partial SLR x5 -Gait with bilateral crutches, cues for NWB through RLE  DATE: EVAL 08/21/22  Changed bandages Quad set Fit brace Gait training with bil axillary crutches    PATIENT EDUCATION:  Education details: Anatomy of condition, POC, HEP, exercise form/rationale Person educated: Patient Education method: Explanation, Demonstration, Tactile cues, Verbal cues, and Handouts Education comprehension: verbalized understanding, returned demonstration, verbal cues required, tactile cues required, and needs further education  HOME EXERCISE PROGRAM: Quad set, elevate   ASSESSMENT:  CLINICAL IMPRESSION: Changed bandages again today as pt is not scheduled to see PT or MD until late next week. Incision is healing well without signs of infection. Educated pt on WB status and instructed in proper gait pattern with 2 crutches. Very slow and antalgic pattern after cuing. Recommended pt limit her time oup on her feet for the next week. She had some pain with gentle exercises today, but overall good tolerance after cues to rest as needed. Educated about modifying activity level to allow for appropriate healing.    OBJECTIVE IMPAIRMENTS: Abnormal gait, decreased activity tolerance, decreased balance, decreased knowledge of use of DME, difficulty walking, decreased ROM, decreased strength, increased edema, increased muscle spasms, impaired flexibility, impaired sensation, improper body mechanics, postural dysfunction, and pain.   ACTIVITY LIMITATIONS: carrying, lifting, bending, sitting, standing, squatting, stairs, transfers, bed mobility, bathing, dressing, and locomotion level  PARTICIPATION LIMITATIONS: meal prep, cleaning, laundry, driving, shopping,  and community activity  PERSONAL FACTORS: 3+ comorbidities: see PMH  are also affecting patient's functional outcome.   REHAB POTENTIAL: Good  CLINICAL  DECISION MAKING: Stable/uncomplicated  EVALUATION COMPLEXITY: Low   GOALS: Goals reviewed with patient? Yes  SHORT TERM GOALS: Target date: 5/31  Advance to full wB pain <=3/10 Baseline:NWB at eval Goal status: INITIAL  2.  SLR without extension lag Baseline: unable at eval Goal status: INITIAL  3.  Knee ROM 0-90 without pain Baseline: unable at eval Goal status: INITIAL   LONG TERM GOALS: Target date: POC date  LEFS to at least 60/80 Baseline: see obj Goal status: INITIAL  2.  Able to navigate stairs with proper form Baseline: unable at eval Goal status: INITIAL  3.  MMT hip & knee to age-appropriate levels with hand held dyno Baseline: not appropriate to test at eval Goal status: INITIAL  4.  Single leg balance control, pain <2/10, on stable and unstable surfaces Baseline: unable at eval Goal status: INITIAL  5.  Prepared to return to running & begin gentle plyometric program Baseline: will progress as appropraite Goal status: INITIAL   PLAN:  PT FREQUENCY: 1-2x/week  PT DURATION: through POC date  PLANNED INTERVENTIONS: Therapeutic exercises, Therapeutic activity, Neuromuscular re-education, Balance training, Gait training, Patient/Family education, Self Care, Joint mobilization, Stair training, DME instructions, Aquatic Therapy, Dry Needling, Electrical stimulation, Spinal mobilization, Cryotherapy, Moist heat, scar mobilization, Taping, Manual therapy, and Re-evaluation.  PLAN FOR NEXT SESSION: continue per protocol   Riki Altes, PTA  08/27/22 10:54 AM   Check all possible CPT codes: 34742 - PT Re-evaluation, 97110- Therapeutic Exercise, [phone]- Neuro Re-education, [phone] - Gait Training, [phone] - Manual Therapy, [phone] - Therapeutic Activities, [phone] - Self Care, [phone] - Electrical stimulation (unattended), [phone] - Physical performance training, and [phone] - Aquatic therapy    Check all conditions that are expected to impact treatment: {Conditions  expected to impact treatment:None of these apply   If treatment provided at initial evaluation, no treatment charged due to lack of authorization.

## 2022-08-28 ENCOUNTER — Encounter (HOSPITAL_BASED_OUTPATIENT_CLINIC_OR_DEPARTMENT_OTHER): Payer: Medicaid Other | Admitting: Physical Therapy

## 2022-09-03 ENCOUNTER — Other Ambulatory Visit (HOSPITAL_BASED_OUTPATIENT_CLINIC_OR_DEPARTMENT_OTHER): Payer: Self-pay

## 2022-09-03 ENCOUNTER — Ambulatory Visit (INDEPENDENT_AMBULATORY_CARE_PROVIDER_SITE_OTHER): Payer: Medicaid Other | Admitting: Orthopaedic Surgery

## 2022-09-03 DIAGNOSIS — M2391 Unspecified internal derangement of right knee: Secondary | ICD-10-CM

## 2022-09-03 DIAGNOSIS — M2341 Loose body in knee, right knee: Secondary | ICD-10-CM

## 2022-09-03 MED ORDER — IBUPROFEN 800 MG PO TABS
800.0000 mg | ORAL_TABLET | Freq: Three times a day (TID) | ORAL | 1 refills | Status: AC
  Filled 2022-09-03: qty 30, 10d supply, fill #0

## 2022-09-03 MED ORDER — OXYCODONE HCL 5 MG PO TABS
5.0000 mg | ORAL_TABLET | ORAL | 0 refills | Status: DC | PRN
  Filled 2022-09-03: qty 15, 3d supply, fill #0

## 2022-09-03 NOTE — Progress Notes (Signed)
Post Operative Evaluation    Procedure/Date of Surgery: Right knee MACI implantation with ACL repair 4/30  Interval History:   Cheryl Tran presents today 2 weeks status post right knee ACL repair and cartilage implantation.  Overall she is experiencing swelling.  She is having a difficult time with range of motion.  She is experiencing pain as well.  There is significant swelling.  She is working on this with physical therapy with Shanda Bumps.   PMH/PSH/Family History/Social History/Meds/Allergies:    Past Medical History:  Diagnosis Date   Anxiety    Asthma    Bipolar 1 disorder (HCC)    Depression    Past Surgical History:  Procedure Laterality Date   KNEE ARTHROSCOPY WITH ANTERIOR CRUCIATE LIGAMENT (ACL) REPAIR Right 08/18/2022   Procedure: RIGHT KNEE ARTHROSCOPY WITH ANTERIOR CRUCIATE LIGAMENT (ACL) REPAIR;  Surgeon: Huel Cote, MD;  Location: [facility];  Service: Orthopedics;  Laterality: Right;   KNEE ARTHROSCOPY WITH MACI CARTILAGE HARVEST Right 06/22/2022   Procedure: RIGHT KNEE ARTHROSCOPY PATELLA CHONDROPLASTY  WITH MACI CARTILAGE HARVEST;  Surgeon: Huel Cote, MD;  Location: [facility];  Service: Orthopedics;  Laterality: Right;   OSTEOCHONDRAL DEFECT REPAIR/RECONSTRUCTION Right 08/18/2022   Procedure: OSTEOCHONDRAL DEFECT REPAIR/RECONSTRUCTION/MACI IMPLANTATION;  Surgeon: Huel Cote, MD;  Location: [facility];  Service: Orthopedics;  Laterality: Right;   Social History   Socioeconomic History   Marital status: Married    Spouse name: Not on file   Number of children: Not on file   Years of education: Not on file   Highest education level: Not on file  Occupational History   Not on file  Tobacco Use   Smoking status: Every Day    Types: Cigarettes, Cigars   Smokeless tobacco: Never  Vaping Use   Vaping Use: Never used  Substance and Sexual Activity   Alcohol use: Yes     Comment: socially   Drug use: Yes    Types: Marijuana    Comment: 08/16/22 last time use   Sexual activity: Yes    Partners: Male    Birth control/protection: None    Comment: First IC <15 y/o, Hx of TV+  Other Topics Concern   Not on file  Social History Narrative   Not on file   Social Determinants of Health   Financial Resource Strain: Medium Risk (05/12/2022)   Overall Financial Resource Strain (CARDIA)    Difficulty of Paying Living Expenses: Somewhat hard  Food Insecurity: No Food Insecurity (05/12/2022)   Hunger Vital Sign    Worried About Running Out of Food in the Last Year: Never true    Ran Out of Food in the Last Year: Never true  Transportation Needs: Unmet Transportation Needs (05/12/2022)   PRAPARE - Transportation    Lack of Transportation (Medical): Yes    Lack of Transportation (Non-Medical): Yes  Physical Activity: Inactive (05/12/2022)   Exercise Vital Sign    Days of Exercise per Week: 0 days    Minutes of Exercise per Session: 0 min  Stress: Stress Concern Present (05/12/2022)   [employer] of Occupational Health - Occupational Stress Questionnaire    Feeling of Stress : Very much  Social Connections: Socially Isolated (05/12/2022)   Social Connection and Isolation Panel [NHANES]    Frequency of Communication with Friends and  Family: More than three times a week    Frequency of Social Gatherings with Friends and Family: Never    Attends Religious Services: Never    Database administrator or Organizations: No    Attends Engineer, structural: Never    Marital Status: Separated   Family History  Problem Relation Age of Onset   Neuropathy Mother    Addison's disease Mother    Hypertension Mother    Pernicious anemia Mother    Hypertension Father    Diabetes Father    Graves' disease Sister    Bipolar disorder Sister    Breast cancer Maternal Grandmother    Hyperthyroidism Maternal Grandmother    No Known Allergies Current Outpatient  Medications  Medication Sig Dispense Refill   ibuprofen (ADVIL) 800 MG tablet Take 1 tablet (800 mg total) by mouth every 8 (eight) hours for 20 days. Please take with food, please alternate with acetaminophen 30 tablet 1   oxyCODONE (OXY IR/ROXICODONE) 5 MG immediate release tablet Take 1 tablet (5 mg total) by mouth every 4 (four) hours as needed (severe pain). 15 tablet 0   acetaminophen (TYLENOL) 325 MG tablet      albuterol (PROVENTIL) (2.5 MG/3ML) 0.083% nebulizer solution Take 3 mLs (2.5 mg total) by nebulization every 6 (six) hours as needed for wheezing or shortness of breath. 90 mL 5   albuterol (VENTOLIN HFA) 108 (90 Base) MCG/ACT inhaler Inhale 2 puffs into the lungs every 6 (six) hours as needed for wheezing or shortness of breath. 18 g 5   ARIPiprazole (ABILIFY) 5 MG tablet Take 1 tablet (5 mg total) by mouth daily in the morning. Increase to 2 tablets (10 mg total) daily after 5 days. 60 tablet 1   aspirin EC 325 MG tablet Take 1 tablet (325 mg total) by mouth daily. 30 tablet 0   clomiPHENE (CLOMID) 50 MG tablet Take 1 tablet (50 mg total) by mouth daily. Take cycle days 3-7 5 tablet 0   fluticasone (FLOVENT HFA) 44 MCG/ACT inhaler      gabapentin (NEURONTIN) 300 MG capsule Take 1 capsule (300 mg total) by mouth 3 (three) times daily. (Patient not taking: Reported on 08/11/2022) 90 capsule 3   metFORMIN (GLUCOPHAGE) 500 MG tablet Take 1 tablet (500 mg total) by mouth 2 (two) times daily with a meal. 180 tablet 1   Oxcarbazepine (TRILEPTAL) 300 MG tablet Take 1 tablet (300 mg total) by mouth 2 (two) times daily. 60 tablet 1   oxyCODONE (ROXICODONE) 5 MG immediate release tablet Take 1 tablet (5 mg total) by mouth every 4 (four) hours as needed for severe pain or breakthrough pain. 30 tablet 0   prazosin (MINIPRESS) 2 MG capsule Take 1 capsule (2 mg total) by mouth at bedtime. 30 capsule 1   SYMBICORT 80-4.5 MCG/ACT inhaler Inhale 2 puffs into the lungs 2 (two) times daily. 10.2 g 5    No current facility-administered medications for this visit.   No results found.  Review of Systems:   A ROS was performed including pertinent positives and negatives as documented in the HPI.   Musculoskeletal Exam:    There were no vitals taken for this visit.  Right knee incisions are well-appearing without erythema or drainage.  There is some numbness in the saphenous distribution.  Range of motion is from 0 to 30 degrees.  There is an effusion.  There is quad atrophy and decreased tone.  No joint line tenderness distal neurosensory exam is  intact.  Negative Lachman  Imaging:      I personally reviewed and interpreted the radiographs.   Assessment:   2 weeks status post right knee ACL repair as well as patellofemoral cartilage implantation overall doing very well.  At this time she will continue to progress according to rehab protocol.  She will continue to advance her weightbearing at this time.  I did advise her to continue taking ibuprofen for swelling.  I will plan to see her back in 4 weeks for reassessment  Plan :    -Return to clinic 4 weeks for reassessment      I personally saw and evaluated the patient, and participated in the management and treatment plan.  Huel Cote, MD Attending Physician, Orthopedic Surgery  This document was dictated using Dragon voice recognition software. A reasonable attempt at proof reading has been made to minimize errors.

## 2022-09-04 ENCOUNTER — Ambulatory Visit (HOSPITAL_BASED_OUTPATIENT_CLINIC_OR_DEPARTMENT_OTHER): Payer: Medicaid Other | Admitting: Physical Therapy

## 2022-09-08 ENCOUNTER — Encounter (HOSPITAL_COMMUNITY): Payer: Self-pay

## 2022-09-08 ENCOUNTER — Ambulatory Visit (HOSPITAL_BASED_OUTPATIENT_CLINIC_OR_DEPARTMENT_OTHER): Payer: Medicaid Other | Admitting: Physical Therapy

## 2022-09-08 ENCOUNTER — Telehealth (HOSPITAL_COMMUNITY): Payer: Medicaid Other | Admitting: Psychiatry

## 2022-09-08 NOTE — Therapy (Deleted)
OUTPATIENT PHYSICAL THERAPY EVALUATION   Patient Name: Cheryl Tran MRN: [id number] DOB:1990-08-10, 32 y.o., female Today's Date: 09/08/2022  END OF SESSION:     Past Medical History:  Diagnosis Date   Anxiety    Asthma    Bipolar 1 disorder (HCC)    Depression    Past Surgical History:  Procedure Laterality Date   KNEE ARTHROSCOPY WITH ANTERIOR CRUCIATE LIGAMENT (ACL) REPAIR Right 08/18/2022   Procedure: RIGHT KNEE ARTHROSCOPY WITH ANTERIOR CRUCIATE LIGAMENT (ACL) REPAIR;  Surgeon: Huel Cote, MD;  Location: [facility];  Service: Orthopedics;  Laterality: Right;   KNEE ARTHROSCOPY WITH MACI CARTILAGE HARVEST Right 06/22/2022   Procedure: RIGHT KNEE ARTHROSCOPY PATELLA CHONDROPLASTY  WITH MACI CARTILAGE HARVEST;  Surgeon: Huel Cote, MD;  Location: [facility];  Service: Orthopedics;  Laterality: Right;   OSTEOCHONDRAL DEFECT REPAIR/RECONSTRUCTION Right 08/18/2022   Procedure: OSTEOCHONDRAL DEFECT REPAIR/RECONSTRUCTION/MACI IMPLANTATION;  Surgeon: Huel Cote, MD;  Location: [facility];  Service: Orthopedics;  Laterality: Right;   Patient Active Problem List   Diagnosis Date Noted   Chondral defect of right patella 08/18/2022   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  ONSET DATE: DOS 4/30   SUBJECTIVE:                                                                                                                                                                                            SUBJECTIVE STATEMENT: *** Pt reports significant pain level. 7/10 constant pain level. Has been using bilateral crutches. Arrives to clinic placing weight through RLE.   PERTINENT HISTORY:  PTDS, currently moving, anxiety  PAIN:  Are you having pain? Yes: NPRS scale: pretty high/10 Pain location: right knee Pain description: sore Aggravating factors: constant Relieving factors: ice  PRECAUTIONS: Knee  WEIGHT BEARING  BRACE  ROM   PHASE I  0-4 weeks  (4/30 - 5/28) As tolerated with  crutches*  0-1 week: Locked  in full extension for  ambulation and  sleeping  1-4 weeks:  Unlocked for  ambulation, remove  for sleeping**  As tolerated  PHASE II  4-12 weeks  (5/28 - 7/23) Full, progressing to  normal gait pattern  Discontinue at day  28 if patient has no  extension lag  Main full  extension and  progressive  flexion   PHASE III  12-16 weeks (7/23 - 8/20) Full, without use of  crutches and with a  normalized gait  pattern  None  Gain full and  pain-free   PHASE IV  16-24 weeks (8/20 - 10/15) Full  None  Full and pain-  free   PHASE V  > 6 months  Full  None  Full and pain-  free     WEIGHT BEARING RESTRICTIONS: Yes WBAT at 2wk (09/01/22)    FALLS:  Has patient fallen in last 6 months? No  PATIENT GOALS: walk, stairs, decr pain   OBJECTIVE: (Measures in this section from initial evaluation unless otherwise noted)  PATIENT SURVEYS:  LEFS 10/80  GAIT: EVAL: arrived in [facility] with bil axillary crutches  Body Part #1 Knee  PALPATION: EVAL: incision well- healing, good patellar mobility  LE MMT LOWER EXTREMITY MMT:  at eval- able to demo slight twitch in quad activation  MMT Right eval   Hip flexion    Hip extension    Hip abduction    Hip adduction    Hip internal rotation    Hip external rotation    Knee flexion    Knee  extension    Ankle dorsiflexion    Ankle plantarflexion    Ankle inversion    Ankle eversion     (Blank rows = not tested)    TREATMENT:                                                                                                                              5/21: - ***  5/9: -Changed bandages -Quad set x20 -PROM R knee -Glute sets 5" x15 -Assisted partial SLR x5 -Gait with bilateral crutches, cues for NWB through RLE  DATE: EVAL 08/21/22  Changed bandages Quad set Fit brace Gait training with bil axillary crutches    PATIENT EDUCATION:  Education details: Anatomy of condition, POC, HEP, exercise form/rationale Person educated: Patient Education method: Explanation, Demonstration, Tactile cues, Verbal cues, and Handouts Education comprehension: verbalized understanding, returned demonstration, verbal cues required, tactile cues required, and needs further education  HOME EXERCISE PROGRAM: Quad set, elevate   ASSESSMENT:  CLINICAL IMPRESSION: *** Changed bandages again today as pt is not scheduled to see PT or MD until late next week. Incision is healing well without signs of infection. Educated pt on WB status and instructed in proper gait pattern with 2 crutches. Very slow and antalgic pattern after cuing. Recommended pt limit her time oup on her feet for the next week. She had some pain with gentle exercises today, but overall good tolerance after cues to rest as needed. Educated about modifying activity level to allow for appropriate healing.  GOALS: Goals reviewed with patient? Yes  SHORT TERM GOALS: Target date: 5/31  Advance to full wB pain <=3/10 Baseline:NWB at eval Goal status: INITIAL  2.  SLR without extension lag Baseline: unable at eval Goal status: INITIAL  3.  Knee ROM 0-90 without pain Baseline: unable at eval Goal status: INITIAL   LONG TERM GOALS: Target date: POC date  LEFS to at least 60/80 Baseline: see obj Goal status:  INITIAL  2.  Able to navigate stairs with proper form Baseline: unable at eval Goal status: INITIAL  3.  MMT hip & knee to age-appropriate levels with hand held dyno Baseline: not appropriate to test at eval Goal status: INITIAL  4.  Single leg balance control, pain <2/10, on stable and unstable surfaces Baseline: unable at eval Goal status: INITIAL  5.  Prepared to return to running & begin gentle plyometric program Baseline: will progress as appropraite Goal status: INITIAL   PLAN:  PT FREQUENCY: 1-2x/week  PT DURATION: through POC date  PLANNED INTERVENTIONS: Therapeutic exercises, Therapeutic activity, Neuromuscular re-education, Balance training, Gait training, Patient/Family education, Self Care, Joint mobilization, Stair training, DME instructions, Aquatic Therapy, Dry Needling, Electrical stimulation, Spinal mobilization, Cryotherapy, Moist heat, scar mobilization, Taping, Manual therapy, and Re-evaluation.  PLAN FOR NEXT SESSION: continue per protocol   Robena Ewy April Ma L Oda Placke, PT 09/08/22 9:18 AM   Check all possible CPT codes: 08657 - PT Re-evaluation, 97110- Therapeutic Exercise, [phone]- Neuro Re-education, [phone] - Gait Training, [phone] - Manual Therapy, 97530 - Therapeutic Activities, [phone] - Self Care, [phone] - Electrical stimulation (unattended), [phone] - Physical performance training, and [phone] - Aquatic therapy    Check all conditions that are expected to impact treatment: {Conditions expected to impact treatment:None of these apply   If treatment provided at initial evaluation, no treatment charged due to lack of authorization.

## 2022-09-11 ENCOUNTER — Ambulatory Visit (HOSPITAL_BASED_OUTPATIENT_CLINIC_OR_DEPARTMENT_OTHER): Payer: Medicaid Other | Admitting: Physical Therapy

## 2022-09-11 ENCOUNTER — Telehealth (HOSPITAL_BASED_OUTPATIENT_CLINIC_OR_DEPARTMENT_OTHER): Payer: Self-pay | Admitting: Physical Therapy

## 2022-09-11 NOTE — Telephone Encounter (Signed)
Contacted patient regarding 2nd consecutive No-show/late cancellation. She was advised to please call and confirm that her appointments next week will work for her schedule.

## 2022-09-16 ENCOUNTER — Ambulatory Visit (HOSPITAL_BASED_OUTPATIENT_CLINIC_OR_DEPARTMENT_OTHER): Payer: Medicaid Other | Admitting: Physical Therapy

## 2022-09-18 ENCOUNTER — Ambulatory Visit (HOSPITAL_BASED_OUTPATIENT_CLINIC_OR_DEPARTMENT_OTHER): Payer: Medicaid Other | Admitting: Physical Therapy

## 2022-09-18 ENCOUNTER — Other Ambulatory Visit (HOSPITAL_BASED_OUTPATIENT_CLINIC_OR_DEPARTMENT_OTHER): Payer: Self-pay

## 2022-09-18 ENCOUNTER — Encounter (HOSPITAL_BASED_OUTPATIENT_CLINIC_OR_DEPARTMENT_OTHER): Payer: Self-pay | Admitting: Physical Therapy

## 2022-09-18 DIAGNOSIS — M25561 Pain in right knee: Secondary | ICD-10-CM | POA: Diagnosis not present

## 2022-09-18 DIAGNOSIS — M6281 Muscle weakness (generalized): Secondary | ICD-10-CM

## 2022-09-18 DIAGNOSIS — R262 Difficulty in walking, not elsewhere classified: Secondary | ICD-10-CM

## 2022-09-18 NOTE — Therapy (Signed)
OUTPATIENT PHYSICAL THERAPY EVALUATION   Patient Name: Cheryl Tran MRN: [id number] DOB:[birth year], 32 y.o., female Today's Date: 09/18/2022  END OF SESSION:  PT End of Session - 09/18/22 1057     Visit Number 3    Number of Visits 33    Date for PT Re-Evaluation 12/11/22    Authorization Type [employer]    PT Start Time 1058    PT Stop Time 1139    PT Time Calculation (min) 41 min    Activity Tolerance Patient tolerated treatment well    Behavior During Therapy WFL for tasks assessed/performed               Past Medical History:  Diagnosis Date   Anxiety    Asthma    Bipolar 1 disorder (HCC)    Depression    Past Surgical History:  Procedure Laterality Date   KNEE ARTHROSCOPY WITH ANTERIOR CRUCIATE LIGAMENT (ACL) REPAIR Right 08/18/2022   Procedure: RIGHT KNEE ARTHROSCOPY WITH ANTERIOR CRUCIATE LIGAMENT (ACL) REPAIR;  Surgeon: Huel Cote, MD;  Location: [facility];  Service: Orthopedics;  Laterality: Right;   KNEE ARTHROSCOPY WITH MACI CARTILAGE HARVEST Right 06/22/2022   Procedure: RIGHT KNEE ARTHROSCOPY PATELLA CHONDROPLASTY  WITH MACI CARTILAGE HARVEST;  Surgeon: Huel Cote, MD;  Location: [facility];  Service: Orthopedics;  Laterality: Right;   OSTEOCHONDRAL DEFECT REPAIR/RECONSTRUCTION Right 08/18/2022   Procedure: OSTEOCHONDRAL DEFECT REPAIR/RECONSTRUCTION/MACI IMPLANTATION;  Surgeon: Huel Cote, MD;  Location: [facility];  Service: Orthopedics;  Laterality: Right;   Patient Active Problem List   Diagnosis Date Noted   Chondral defect of right patella 08/18/2022   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, right 08/18/2022   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 06/22/2022   Bipolar 1 disorder, mixed, moderate (HCC) 05/12/2022   PTSD (post-traumatic stress disorder) 05/12/2022   Anxiety state 05/12/2022   PCOS (polycystic ovarian syndrome) 04/29/2022   Asthma 03/04/1990     REFERRING PROVIDER: Steward Drone,  MD  REFERRING DIAG:   M23.91 (ICD-10-CM) - Ligamentous laxity of right knee  RIGHT KNEE ARTHROSCOPY WITH ANTERIOR CRUCIATE LIGAMENT (ACL) REPAIR / MACI CARTILAGE IMPLANTATION  Rationale for Evaluation and Treatment: Rehabilitation  THERAPY DIAG:  Acute pain of right knee  Muscle weakness (generalized)  Difficulty in walking, not elsewhere classified  ONSET DATE: DOS 4/30   SUBJECTIVE:                                                                                                                                                                                           SUBJECTIVE STATEMENT: It is healing, finally got rid of the pain.  Ambulating with bil axillary crutches and brace locked in extension.   PERTINENT HISTORY:  PTDS, currently moving, anxiety  PAIN:  Are you having pain? Yes: NPRS scale: pretty high/10 Pain location: right knee Pain description: sore Aggravating factors: constant Relieving factors: ice  PRECAUTIONS: Knee  WEIGHT BEARING RESTRICTIONS: Yes WBAT at 2wk  FALLS:  Has patient fallen in last 6 months? No  PATIENT GOALS: walk, stairs, decr pain   OBJECTIVE:   PATIENT SURVEYS:  LEFS 10/80    GAIT: EVAL: arrived in [facility] with bil axillary crutches   Body Part #1 Knee  PALPATION: EVAL: incision well- healing, good patellar mobility  LE MMT LOWER EXTREMITY MMT:  at eval- able to demo slight twitch in quad activation  MMT Right eval   Hip flexion    Hip extension    Hip abduction    Hip adduction    Hip internal rotation    Hip external rotation    Knee flexion    Knee extension    Ankle dorsiflexion    Ankle plantarflexion    Ankle inversion    Ankle eversion     (Blank rows = not tested)      TREATMENT:                                                                                                                               Treatment                            5/31:  SAQ with and without assist Long sitting quad  set, both legs contracting side by side SL hip abd SL hip circles Standing lateral weight shift with quad set Standing heel/toe raise- encouraging quad and glut set Mini squats- cues required to stay in the middle of her stance Gait: in brace and with crutches- ext unlocked    5/9: -Changed bandages -Quad set x20 -PROM R knee -Glute sets 5" x15 -Assisted partial SLR x5 -Gait with bilateral crutches, cues for NWB through RLE  DATE: EVAL 08/21/22  Changed bandages Quad set Fit brace Gait training with bil axillary crutches    PATIENT EDUCATION:  Education details: Anatomy of condition, POC, HEP, exercise form/rationale Person educated: Patient Education method: Explanation, Demonstration, Tactile cues, Verbal cues, and Handouts Education comprehension: verbalized understanding, returned demonstration, verbal cues required, tactile cues required, and needs further education  HOME EXERCISE PROGRAM: [id number]   ASSESSMENT:  CLINICAL IMPRESSION: Able to demo standing activation of quads, cues required to avoid compensation and overuse of opp LE. Limited OKC activation but is able to lift foot independently on to bed after she was stopped from assisting with UEs. Flexion block released in brace today to allow for more normalized swing through- advised her to have crutches when out of the house but to be aware that she is not leaning forward and placing weight into them & using gluts  for upright posture.    OBJECTIVE IMPAIRMENTS: Abnormal gait, decreased activity tolerance, decreased balance, decreased knowledge of use of DME, difficulty walking, decreased ROM, decreased strength, increased edema, increased muscle spasms, impaired flexibility, impaired sensation, improper body mechanics, postural dysfunction, and pain.   ACTIVITY LIMITATIONS: carrying, lifting, bending, sitting, standing, squatting, stairs, transfers, bed mobility, bathing, dressing, and locomotion  level  PARTICIPATION LIMITATIONS: meal prep, cleaning, laundry, driving, shopping, and community activity  PERSONAL FACTORS: 3+ comorbidities: see PMH  are also affecting patient's functional outcome.   REHAB POTENTIAL: Good  CLINICAL DECISION MAKING: Stable/uncomplicated  EVALUATION COMPLEXITY: Low   GOALS: Goals reviewed with patient? Yes  SHORT TERM GOALS: Target date: 5/31  Advance to full wB pain <=3/10 Baseline:NWB at eval Goal status: achieved  2.  SLR without extension lag Baseline: unable on 5/31 Goal status: not met  3.  Knee ROM 0-90 without pain Baseline: unable at eval Goal status: achieved   LONG TERM GOALS: Target date: POC date  LEFS to at least 60/80 Baseline: see obj Goal status: INITIAL  2.  Able to navigate stairs with proper form Baseline: unable at eval Goal status: INITIAL  3.  MMT hip & knee to age-appropriate levels with hand held dyno Baseline: not appropriate to test at eval Goal status: INITIAL  4.  Single leg balance control, pain <2/10, on stable and unstable surfaces Baseline: unable at eval Goal status: INITIAL  5.  Prepared to return to running & begin gentle plyometric program Baseline: will progress as appropraite Goal status: INITIAL   PLAN:  PT FREQUENCY: 1-2x/week  PT DURATION: through POC date  PLANNED INTERVENTIONS: Therapeutic exercises, Therapeutic activity, Neuromuscular re-education, Balance training, Gait training, Patient/Family education, Self Care, Joint mobilization, Stair training, DME instructions, Aquatic Therapy, Dry Needling, Electrical stimulation, Spinal mobilization, Cryotherapy, Moist heat, scar mobilization, Taping, Manual therapy, and Re-evaluation.  PLAN FOR NEXT SESSION: continue per protocol  Berenize Gatlin C. Elisabetta Mishra PT, DPT 09/18/22 11:42 AM    Check all possible CPT codes: 42595 - PT Re-evaluation, 97110- Therapeutic Exercise, [phone]- Neuro Re-education, [phone] - Gait Training, [phone] -  Manual Therapy, 97530 - Therapeutic Activities, [phone] - Self Care, [phone] - Electrical stimulation (unattended), [phone] - Physical performance training, and [phone] - Aquatic therapy    Check all conditions that are expected to impact treatment: {Conditions expected to impact treatment:None of these apply   If treatment provided at initial evaluation, no treatment charged due to lack of authorization.

## 2022-09-23 ENCOUNTER — Ambulatory Visit (HOSPITAL_BASED_OUTPATIENT_CLINIC_OR_DEPARTMENT_OTHER): Payer: Medicaid Other | Attending: Orthopaedic Surgery | Admitting: Physical Therapy

## 2022-09-23 DIAGNOSIS — M25661 Stiffness of right knee, not elsewhere classified: Secondary | ICD-10-CM | POA: Insufficient documentation

## 2022-09-23 DIAGNOSIS — M6281 Muscle weakness (generalized): Secondary | ICD-10-CM | POA: Insufficient documentation

## 2022-09-23 DIAGNOSIS — M25561 Pain in right knee: Secondary | ICD-10-CM | POA: Insufficient documentation

## 2022-09-23 DIAGNOSIS — R262 Difficulty in walking, not elsewhere classified: Secondary | ICD-10-CM | POA: Insufficient documentation

## 2022-09-25 ENCOUNTER — Encounter (HOSPITAL_BASED_OUTPATIENT_CLINIC_OR_DEPARTMENT_OTHER): Payer: Self-pay | Admitting: Physical Therapy

## 2022-09-25 ENCOUNTER — Ambulatory Visit (HOSPITAL_BASED_OUTPATIENT_CLINIC_OR_DEPARTMENT_OTHER): Payer: Medicaid Other | Admitting: Physical Therapy

## 2022-09-25 DIAGNOSIS — M6281 Muscle weakness (generalized): Secondary | ICD-10-CM

## 2022-09-25 DIAGNOSIS — M25561 Pain in right knee: Secondary | ICD-10-CM | POA: Diagnosis present

## 2022-09-25 DIAGNOSIS — R262 Difficulty in walking, not elsewhere classified: Secondary | ICD-10-CM | POA: Diagnosis present

## 2022-09-25 DIAGNOSIS — M25661 Stiffness of right knee, not elsewhere classified: Secondary | ICD-10-CM

## 2022-09-25 NOTE — Therapy (Signed)
OUTPATIENT PHYSICAL THERAPY EVALUATION   Patient Name: Cheryl Tran MRN: [id number] DOB:[birth year], 32 y.o., female Today's Date: 09/25/2022  END OF SESSION:  PT End of Session - 09/25/22 1121     Visit Number 4    Number of Visits 33    Date for PT Re-Evaluation 12/11/22    Authorization Type [employer]    PT Start Time 1110    PT Stop Time 1142    PT Time Calculation (min) 32 min    Activity Tolerance Patient tolerated treatment well    Behavior During Therapy WFL for tasks assessed/performed                Past Medical History:  Diagnosis Date   Anxiety    Asthma    Bipolar 1 disorder (HCC)    Depression    Past Surgical History:  Procedure Laterality Date   KNEE ARTHROSCOPY WITH ANTERIOR CRUCIATE LIGAMENT (ACL) REPAIR Right 08/18/2022   Procedure: RIGHT KNEE ARTHROSCOPY WITH ANTERIOR CRUCIATE LIGAMENT (ACL) REPAIR;  Surgeon: Huel Cote, MD;  Location: [facility];  Service: Orthopedics;  Laterality: Right;   KNEE ARTHROSCOPY WITH MACI CARTILAGE HARVEST Right 06/22/2022   Procedure: RIGHT KNEE ARTHROSCOPY PATELLA CHONDROPLASTY  WITH MACI CARTILAGE HARVEST;  Surgeon: Huel Cote, MD;  Location: [facility];  Service: Orthopedics;  Laterality: Right;   OSTEOCHONDRAL DEFECT REPAIR/RECONSTRUCTION Right 08/18/2022   Procedure: OSTEOCHONDRAL DEFECT REPAIR/RECONSTRUCTION/MACI IMPLANTATION;  Surgeon: Huel Cote, MD;  Location: [facility];  Service: Orthopedics;  Laterality: Right;   Patient Active Problem List   Diagnosis Date Noted   Chondral defect of right patella 08/18/2022   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, right 08/18/2022   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 06/22/2022   Bipolar 1 disorder, mixed, moderate (HCC) 05/12/2022   PTSD (post-traumatic stress disorder) 05/12/2022   Anxiety state 05/12/2022   PCOS (polycystic ovarian syndrome) 04/29/2022   Asthma 03/04/1990     REFERRING PROVIDER: Steward Drone,  MD  REFERRING DIAG:   M23.91 (ICD-10-CM) - Ligamentous laxity of right knee  RIGHT KNEE ARTHROSCOPY WITH ANTERIOR CRUCIATE LIGAMENT (ACL) REPAIR / MACI CARTILAGE IMPLANTATION  Rationale for Evaluation and Treatment: Rehabilitation  THERAPY DIAG:  Acute pain of right knee  Muscle weakness (generalized)  Difficulty in walking, not elsewhere classified  Stiffness of right knee, not elsewhere classified  ONSET DATE: DOS 4/30  Days since surgery: 38    SUBJECTIVE:  SUBJECTIVE STATEMENT: Pt states the knee is still swelling and she is icing to manage swelling. She feels she maybe does too much at home. Flexion and ext are improving.   PERTINENT HISTORY:  PTDS, currently moving, anxiety  PAIN:  Are you having pain? No: NPRS scale: 0/10 Pain location: right knee Pain description: sore Aggravating factors: constant Relieving factors: ice PRECAUTIONS: Knee  WEIGHT BEARING RESTRICTIONS: Yes WBAT at 2wk  FALLS:  Has patient fallen in last 6 months? No  PATIENT GOALS: walk, stairs, decr pain   OBJECTIVE:   PATIENT SURVEYS:  LEFS 10/80    GAIT: EVAL: arrived in [facility] with bil axillary crutches   Body Part #1 Knee  PALPATION: EVAL: incision well- healing, good patellar mobility  LE MMT LOWER EXTREMITY MMT:  at eval- able to demo slight twitch in quad activation  MMT Right 6/7   Hip flexion    Hip extension    Hip abduction    Hip adduction    Hip internal rotation    Hip external rotation    Knee flexion 93   Knee extension -3   Ankle dorsiflexion    Ankle plantarflexion    Ankle inversion    Ankle eversion     (Blank rows = not tested)       TREATMENT:          6/7   Exercises - Sidelying Hip Abduction  - 1 x daily - 7 x weekly - 3 sets - 10 reps - Standing  Heel Raises  - 1 x daily - 7 x weekly - 3 sets - 10 reps - Heel Toe Raises with Counter Support  - 1 x daily - 7 x weekly - 3 sets - 10 reps - Supine Active Straight Leg Raise  - 2 x daily - 7 x weekly - 2 sets - 10 reps - Standing Terminal Knee Extension with Resistance  - 2 x daily - 7 x weekly - 3 sets - 10 reps - 5 hold - Sit to Stand  - 1 x daily - 7 x weekly - 3 sets - 8 reps                                                                                                                      Gait: 32ft walking without brace, focusing on heel strike and TKE; able to perform gait without signs of knee buckling  Treatment                            5/31:  SAQ with and without assist Long sitting quad set, both legs contracting side by side SL hip abd SL hip circles Standing lateral weight shift with quad set Standing heel/toe raise- encouraging quad and glut set Mini squats- cues required to stay in the middle of her stance Gait: in brace and with crutches- ext unlocked    5/9: -Changed bandages -Quad set x20 -PROM R  knee -Glute sets 5" x15 -Assisted partial SLR x5 -Gait with bilateral crutches, cues for NWB through RLE  DATE: EVAL 08/21/22  Changed bandages Quad set Fit brace Gait training with bil axillary crutches    PATIENT EDUCATION:  Education details: Anatomy of condition, POC, HEP, exercise form/rationale Person educated: Patient Education method: Explanation, Demonstration, Tactile cues, Verbal cues, and Handouts Education comprehension: verbalized understanding, returned demonstration, verbal cues required, tactile cues required, and needs further education  HOME EXERCISE PROGRAM: [id number]   ASSESSMENT:  CLINICAL IMPRESSION: Pt able to progress knee flexion ROM today in addition to loaded knee extension. No pain noted during session. Pt able to demo 20x SLR with very mild extensor lag. No signs of buckling with gait. Pt advised to go to brace only walking  at this time. HEP updated and progressed with CKC and TKE focused exercise. Plan to continue per protocol. Pt would benefit from continued skilled therapy in order to reach goals and maximize functional R LE strength and ROM for full return to PLOF.    OBJECTIVE IMPAIRMENTS: Abnormal gait, decreased activity tolerance, decreased balance, decreased knowledge of use of DME, difficulty walking, decreased ROM, decreased strength, increased edema, increased muscle spasms, impaired flexibility, impaired sensation, improper body mechanics, postural dysfunction, and pain.   ACTIVITY LIMITATIONS: carrying, lifting, bending, sitting, standing, squatting, stairs, transfers, bed mobility, bathing, dressing, and locomotion level  PARTICIPATION LIMITATIONS: meal prep, cleaning, laundry, driving, shopping, and community activity  PERSONAL FACTORS: 3+ comorbidities: see PMH  are also affecting patient's functional outcome.   REHAB POTENTIAL: Good  CLINICAL DECISION MAKING: Stable/uncomplicated  EVALUATION COMPLEXITY: Low   GOALS: Goals reviewed with patient? Yes  SHORT TERM GOALS: Target date: 5/31  Advance to full wB pain <=3/10 Baseline:NWB at eval Goal status: achieved  2.  SLR without extension lag Baseline: unable on 5/31 Goal status: not met  3.  Knee ROM 0-90 without pain Baseline: unable at eval Goal status: achieved   LONG TERM GOALS: Target date: POC date  LEFS to at least 60/80 Baseline: see obj Goal status: INITIAL  2.  Able to navigate stairs with proper form Baseline: unable at eval Goal status: INITIAL  3.  MMT hip & knee to age-appropriate levels with hand held dyno Baseline: not appropriate to test at eval Goal status: INITIAL  4.  Single leg balance control, pain <2/10, on stable and unstable surfaces Baseline: unable at eval Goal status: INITIAL  5.  Prepared to return to running & begin gentle plyometric program Baseline: will progress as appropraite Goal  status: INITIAL   PLAN:  PT FREQUENCY: 1-2x/week  PT DURATION: through POC date  PLANNED INTERVENTIONS: Therapeutic exercises, Therapeutic activity, Neuromuscular re-education, Balance training, Gait training, Patient/Family education, Self Care, Joint mobilization, Stair training, DME instructions, Aquatic Therapy, Dry Needling, Electrical stimulation, Spinal mobilization, Cryotherapy, Moist heat, scar mobilization, Taping, Manual therapy, and Re-evaluation.  PLAN FOR NEXT SESSION: continue per protocol  Zebedee Iba PT, DPT 09/25/22 11:57 AM    Check all possible CPT codes: 65784 - PT Re-evaluation, 97110- Therapeutic Exercise, [phone]- Neuro Re-education, [phone] - Gait Training, [phone] - Manual Therapy, [phone] - Therapeutic Activities, [phone] - Self Care, [phone] - Electrical stimulation (unattended), [phone] - Physical performance training, and [phone] - Aquatic therapy    Check all conditions that are expected to impact treatment: {Conditions expected to impact treatment:None of these apply   If treatment provided at initial evaluation, no treatment charged due to lack of authorization.

## 2022-09-30 ENCOUNTER — Ambulatory Visit (HOSPITAL_BASED_OUTPATIENT_CLINIC_OR_DEPARTMENT_OTHER): Payer: Medicaid Other | Admitting: Physical Therapy

## 2022-09-30 SURGERY — REPAIR, TENDON, PERONEUS BREVIS
Anesthesia: General

## 2022-10-02 ENCOUNTER — Encounter (HOSPITAL_BASED_OUTPATIENT_CLINIC_OR_DEPARTMENT_OTHER): Payer: Medicaid Other | Admitting: Orthopaedic Surgery

## 2022-10-02 ENCOUNTER — Ambulatory Visit (HOSPITAL_BASED_OUTPATIENT_CLINIC_OR_DEPARTMENT_OTHER): Payer: Medicaid Other | Admitting: Physical Therapy

## 2022-10-07 ENCOUNTER — Ambulatory Visit (HOSPITAL_BASED_OUTPATIENT_CLINIC_OR_DEPARTMENT_OTHER): Payer: Medicaid Other | Admitting: Physical Therapy

## 2022-10-07 ENCOUNTER — Encounter (HOSPITAL_BASED_OUTPATIENT_CLINIC_OR_DEPARTMENT_OTHER): Payer: Self-pay | Admitting: Physical Therapy

## 2022-10-07 DIAGNOSIS — M6281 Muscle weakness (generalized): Secondary | ICD-10-CM | POA: Diagnosis not present

## 2022-10-07 DIAGNOSIS — M25661 Stiffness of right knee, not elsewhere classified: Secondary | ICD-10-CM

## 2022-10-07 DIAGNOSIS — M25561 Pain in right knee: Secondary | ICD-10-CM

## 2022-10-07 DIAGNOSIS — R262 Difficulty in walking, not elsewhere classified: Secondary | ICD-10-CM

## 2022-10-07 NOTE — Therapy (Signed)
OUTPATIENT PHYSICAL THERAPY EVALUATION   Patient Name: Cheryl Tran MRN: [id number] DOB:1991-02-20, 32 y.o., female Today's Date: 10/07/2022  END OF SESSION:  PT End of Session - 10/07/22 1102     Visit Number 5    Number of Visits 33    Date for PT Re-Evaluation 12/11/22    Authorization Type [employer]    PT Start Time 1101    PT Stop Time 1140    PT Time Calculation (min) 39 min    Activity Tolerance Patient tolerated treatment well    Behavior During Therapy WFL for tasks assessed/performed                Past Medical History:  Diagnosis Date   Anxiety    Asthma    Bipolar 1 disorder (HCC)    Depression    Past Surgical History:  Procedure Laterality Date   KNEE ARTHROSCOPY WITH ANTERIOR CRUCIATE LIGAMENT (ACL) REPAIR Right 08/18/2022   Procedure: RIGHT KNEE ARTHROSCOPY WITH ANTERIOR CRUCIATE LIGAMENT (ACL) REPAIR;  Surgeon: Huel Cote, MD;  Location: [facility];  Service: Orthopedics;  Laterality: Right;   KNEE ARTHROSCOPY WITH MACI CARTILAGE HARVEST Right 06/22/2022   Procedure: RIGHT KNEE ARTHROSCOPY PATELLA CHONDROPLASTY  WITH MACI CARTILAGE HARVEST;  Surgeon: Huel Cote, MD;  Location: [facility];  Service: Orthopedics;  Laterality: Right;   OSTEOCHONDRAL DEFECT REPAIR/RECONSTRUCTION Right 08/18/2022   Procedure: OSTEOCHONDRAL DEFECT REPAIR/RECONSTRUCTION/MACI IMPLANTATION;  Surgeon: Huel Cote, MD;  Location:  SUR[facility]ERY C[facility];  Service: Orthopedics;  Laterality: Right;   Patient Active Problem List   Diagnosis Date Noted   Chondral defect of right patella 08/18/2022   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, right 08/18/2022   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 06/22/2022   Bipolar 1 disorder, mixed, moderate (HCC) 05/12/2022   PTSD (post-traumatic stress disorder) 05/12/2022   Anxiety state 05/12/2022   PCOS (polycystic ovarian syndrome) 04/29/2022   Asthma 03/04/1990     REFERRING PROVIDER: Steward Drone,  MD  REFERRING DIAG:   M23.91 (ICD-10-CM) - Ligamentous laxity of right knee  RIGHT KNEE ARTHROSCOPY WITH ANTERIOR CRUCIATE LIGAMENT (ACL) REPAIR / MACI CARTILAGE IMPLANTATION  Rationale for Evaluation and Treatment: Rehabilitation  THERAPY DIAG:  Acute pain of right knee  Muscle weakness (generalized)  Difficulty in walking, not elsewhere classified  Stiffness of right knee, not elsewhere classified  ONSET DATE: DOS 4/30  Days since surgery: 50    SUBJECTIVE:  SUBJECTIVE STATEMENT: Pt she had to go to [facility] recently unfortunately due to her mother's health issues and did a lot of walking. It will still swell on occasion for no particular reason. She walked about 2 miles yesterday on it without issues.   PERTINENT HISTORY:  PTDS, currently moving, anxiety  PAIN:  Are you having pain? No: NPRS scale: 0/10 Pain location: right knee Pain description: sore Aggravating factors: constant Relieving factors: ice PRECAUTIONS: Knee  WEIGHT BEARING RESTRICTIONS: Yes WBAT at 2wk  FALLS:  Has patient fallen in last 6 months? No  PATIENT GOALS: walk, stairs, decr pain   OBJECTIVE:   PATIENT SURVEYS:  LEFS 10/80    GAIT: EVAL: arrived in [facility] with bil axillary crutches   Body Part #1 Knee  PALPATION: EVAL: incision well- healing, good patellar mobility  LE MMT LOWER EXTREMITY MMT:  at eval- able to demo slight twitch in quad activation  MMT Right 6/7   Hip flexion    Hip extension    Hip abduction    Hip adduction    Hip internal rotation    Hip external rotation    Knee flexion 93   Knee extension -3   Ankle dorsiflexion    Ankle plantarflexion    Ankle inversion    Ankle eversion     (Blank rows = not tested)    TREATMENT:    6/19  Upright bike 5 min full revolutions    Self scar massage  SLR 5lbs 3x10 Prone HS curl 3x10 5lbs  Prone quad stretch 30s 3x with strap Step up 8" 3x10 Lateral step down 4" 3x10 SLS on airex 30s 4x Sidestepping GTB at knees 65ft 3x laps   6/7   Exercises - Sidelying Hip Abduction  - 1 x daily - 7 x weekly - 3 sets - 10 reps - Standing Heel Raises  - 1 x daily - 7 x weekly - 3 sets - 10 reps - Heel Toe Raises with Counter Support  - 1 x daily - 7 x weekly - 3 sets - 10 reps - Supine Active Straight Leg Raise  - 2 x daily - 7 x weekly - 2 sets - 10 reps - Standing Terminal Knee Extension with Resistance  - 2 x daily - 7 x weekly - 3 sets - 10 reps - 5 hold - Sit to Stand  - 1 x daily - 7 x weekly - 3 sets - 8 reps                                                                                                                      Gait: 55ft walking without brace, focusing on heel strike and TKE; able to perform gait without signs of knee buckling  Treatment                            5/31:  SAQ with and without assist Long sitting quad set, both  legs contracting side by side SL hip abd SL hip circles Standing lateral weight shift with quad set Standing heel/toe raise- encouraging quad and glut set Mini squats- cues required to stay in the middle of her stance Gait: in brace and with crutches- ext unlocked    5/9: -Changed bandages -Quad set x20 -PROM R knee -Glute sets 5" x15 -Assisted partial SLR x5 -Gait with bilateral crutches, cues for NWB through RLE  DATE: EVAL 08/21/22  Changed bandages Quad set Fit brace Gait training with bil axillary crutches    PATIENT EDUCATION:  Education details: Anatomy of condition, POC, HEP, exercise form/rationale Person educated: Patient Education method: Explanation, Demonstration, Tactile cues, Verbal cues, and Handouts Education comprehension: verbalized understanding, returned demonstration, verbal cues required, tactile cues required, and needs further  education  HOME EXERCISE PROGRAM: [id number]   ASSESSMENT:  CLINICAL IMPRESSION: Pt flexion progressed to 105 today following session. Pt able to progress quad strengthening exercise in [place] and [facility] today with minimal extensor lag noted in SLR. Pt without pain during session but does have increase in instability with balance and eccentric control based exercise. Pt HEP updated today. Pt is progressing well with strength and mobility but advised to continue with scar tissue massage to aid flexion and focusing on TKE at home to reduce extensor lag. No signs of buckling with standing exercise and gait. Plan to continue per protocol. Pt would benefit from continued skilled therapy in order to reach goals and maximize functional R LE strength and ROM for full return to PLOF.    OBJECTIVE IMPAIRMENTS: Abnormal gait, decreased activity tolerance, decreased balance, decreased knowledge of use of DME, difficulty walking, decreased ROM, decreased strength, increased edema, increased muscle spasms, impaired flexibility, impaired sensation, improper body mechanics, postural dysfunction, and pain.   ACTIVITY LIMITATIONS: carrying, lifting, bending, sitting, standing, squatting, stairs, transfers, bed mobility, bathing, dressing, and locomotion level  PARTICIPATION LIMITATIONS: meal prep, cleaning, laundry, driving, shopping, and community activity  PERSONAL FACTORS: 3+ comorbidities: see PMH  are also affecting patient's functional outcome.   REHAB POTENTIAL: Good  CLINICAL DECISION MAKING: Stable/uncomplicated  EVALUATION COMPLEXITY: Low   GOALS: Goals reviewed with patient? Yes  SHORT TERM GOALS: Target date: 5/31  Advance to full wB pain <=3/10 Baseline:NWB at eval Goal status: achieved  2.  SLR without extension lag Baseline: unable on 5/31 Goal status: not met  3.  Knee ROM 0-90 without pain Baseline: unable at eval Goal status: achieved   LONG TERM GOALS: Target date: POC  date  LEFS to at least 60/80 Baseline: see obj Goal status: INITIAL  2.  Able to navigate stairs with proper form Baseline: unable at eval Goal status: INITIAL  3.  MMT hip & knee to age-appropriate levels with hand held dyno Baseline: not appropriate to test at eval Goal status: INITIAL  4.  Single leg balance control, pain <2/10, on stable and unstable surfaces Baseline: unable at eval Goal status: INITIAL  5.  Prepared to return to running & begin gentle plyometric program Baseline: will progress as appropraite Goal status: INITIAL   PLAN:  PT FREQUENCY: 1-2x/week  PT DURATION: through POC date  PLANNED INTERVENTIONS: Therapeutic exercises, Therapeutic activity, Neuromuscular re-education, Balance training, Gait training, Patient/Family education, Self Care, Joint mobilization, Stair training, DME instructions, Aquatic Therapy, Dry Needling, Electrical stimulation, Spinal mobilization, Cryotherapy, Moist heat, scar mobilization, Taping, Manual therapy, and Re-evaluation.  PLAN FOR NEXT SESSION: continue per protocol  Zebedee Iba PT, DPT 10/07/22 11:45 AM  Check all possible CPT codes: 21308 - PT Re-evaluation, 97110- Therapeutic Exercise, [phone]- Neuro Re-education, [phone] - Gait Training, [phone] - Manual Therapy, 97530 - Therapeutic Activities, [phone] - Self Care, [phone] - Electrical stimulation (unattended), [phone] - Physical performance training, and [phone] - Aquatic therapy    Check all conditions that are expected to impact treatment: {Conditions expected to impact treatment:None of these apply   If treatment provided at initial evaluation, no treatment charged due to lack of authorization.

## 2022-10-09 ENCOUNTER — Ambulatory Visit (HOSPITAL_BASED_OUTPATIENT_CLINIC_OR_DEPARTMENT_OTHER): Payer: Medicaid Other | Admitting: Physical Therapy

## 2022-10-09 ENCOUNTER — Other Ambulatory Visit: Payer: Medicaid Other

## 2022-10-14 ENCOUNTER — Ambulatory Visit (HOSPITAL_BASED_OUTPATIENT_CLINIC_OR_DEPARTMENT_OTHER): Payer: Medicaid Other | Admitting: Physical Therapy

## 2022-10-16 ENCOUNTER — Ambulatory Visit (HOSPITAL_BASED_OUTPATIENT_CLINIC_OR_DEPARTMENT_OTHER): Payer: Medicaid Other | Admitting: Physical Therapy

## 2022-10-16 ENCOUNTER — Encounter (HOSPITAL_BASED_OUTPATIENT_CLINIC_OR_DEPARTMENT_OTHER): Payer: Self-pay | Admitting: Physical Therapy

## 2022-10-16 DIAGNOSIS — M6281 Muscle weakness (generalized): Secondary | ICD-10-CM | POA: Diagnosis not present

## 2022-10-16 DIAGNOSIS — M25561 Pain in right knee: Secondary | ICD-10-CM

## 2022-10-16 DIAGNOSIS — M25661 Stiffness of right knee, not elsewhere classified: Secondary | ICD-10-CM

## 2022-10-16 DIAGNOSIS — R262 Difficulty in walking, not elsewhere classified: Secondary | ICD-10-CM

## 2022-10-16 NOTE — Therapy (Signed)
OUTPATIENT PHYSICAL THERAPY EVALUATION   Patient Name: Cheryl Tran MRN: [id number] DOB:[birth year], 32 y.o., female Today's Date: 10/16/2022  END OF SESSION:  PT End of Session - 10/16/22 1107     Visit Number 6    Number of Visits 33    Date for PT Re-Evaluation 12/11/22    Authorization Type [employer]    PT Start Time 1102    PT Stop Time 1145    PT Time Calculation (min) 43 min    Activity Tolerance Patient tolerated treatment well    Behavior During Therapy WFL for tasks assessed/performed                Past Medical History:  Diagnosis Date   Anxiety    Asthma    Bipolar 1 disorder (HCC)    Depression    Past Surgical History:  Procedure Laterality Date   KNEE ARTHROSCOPY WITH ANTERIOR CRUCIATE LIGAMENT (ACL) REPAIR Right 08/18/2022   Procedure: RIGHT KNEE ARTHROSCOPY WITH ANTERIOR CRUCIATE LIGAMENT (ACL) REPAIR;  Surgeon: Huel Cote, MD;  Location: [facility];  Service: Orthopedics;  Laterality: Right;   KNEE ARTHROSCOPY WITH MACI CARTILAGE HARVEST Right 06/22/2022   Procedure: RIGHT KNEE ARTHROSCOPY PATELLA CHONDROPLASTY  WITH MACI CARTILAGE HARVEST;  Surgeon: Huel Cote, MD;  Location: [facility];  Service: Orthopedics;  Laterality: Right;   OSTEOCHONDRAL DEFECT REPAIR/RECONSTRUCTION Right 08/18/2022   Procedure: OSTEOCHONDRAL DEFECT REPAIR/RECONSTRUCTION/MACI IMPLANTATION;  Surgeon: Huel Cote, MD;  Location: [facility];  Service: Orthopedics;  Laterality: Right;   Patient Active Problem List   Diagnosis Date Noted   Chondral defect of right patella 08/18/2022   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, right 08/18/2022   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 06/22/2022   Bipolar 1 disorder, mixed, moderate (HCC) 05/12/2022   PTSD (post-traumatic stress disorder) 05/12/2022   Anxiety state 05/12/2022   PCOS (polycystic ovarian syndrome) 04/29/2022   Asthma 03/04/1990     REFERRING PROVIDER: Steward Drone,  MD  REFERRING DIAG:   M23.91 (ICD-10-CM) - Ligamentous laxity of right knee  RIGHT KNEE ARTHROSCOPY WITH ANTERIOR CRUCIATE LIGAMENT (ACL) REPAIR / MACI CARTILAGE IMPLANTATION  Rationale for Evaluation and Treatment: Rehabilitation  THERAPY DIAG:  Muscle weakness (generalized)  Acute pain of right knee  Difficulty in walking, not elsewhere classified  Stiffness of right knee, not elsewhere classified  ONSET DATE: DOS 4/30  Days since surgery: 59    SUBJECTIVE:  SUBJECTIVE STATEMENT:  Pt states the scar massage has made huge difference in the knee. She was not sore after last session and she has improved walking greatly. She is up to 2.5 miles of walking with hills.   PERTINENT HISTORY:  PTDS, currently moving, anxiety  PAIN:  Are you having pain? No: NPRS scale: 0/10 Pain location: right knee Pain description: sore Aggravating factors: constant Relieving factors: ice PRECAUTIONS: Knee  WEIGHT BEARING RESTRICTIONS: Yes WBAT at 2wk  FALLS:  Has patient fallen in last 6 months? No  PATIENT GOALS: walk, stairs, decr pain   OBJECTIVE:   PATIENT SURVEYS:  LEFS 10/80    GAIT: EVAL: arrived in [facility] with bil axillary crutches   Body Part #1 Knee  PALPATION: EVAL: incision well- healing, good patellar mobility  LE MMT LOWER EXTREMITY MMT:  at eval- able to demo slight twitch in quad activation  MMT Right 6/28   Hip flexion    Hip extension    Hip abduction    Hip adduction    Hip internal rotation    Hip external rotation    Knee flexion 110   Knee extension 0   Ankle dorsiflexion    Ankle plantarflexion    Ankle inversion    Ankle eversion     (Blank rows = not tested)    TREATMENT:    6/28  Recumbent bike 6 min full revolutions   Goblet squat 5lbs 3x8  SLR  5lbs 3x10 Prone HS curl 3x10 5lbs  Prone quad stretch 30s 3x with strap SLS on airex with 10lb KB pass 3x10 Wall isometric lunge hold 15s 4x  Shuttle leg press 50 lbs SL 4x6  6/19  Upright bike 5 min full revolutions   Self scar massage  SLR 5lbs 3x10 Prone HS curl 3x10 5lbs  Prone quad stretch 30s 3x with strap Step up 8" 3x10 Lateral step down 4" 3x10 SLS on airex 30s 4x Sidestepping GTB at knees 77ft 3x laps   6/7   Exercises - Sidelying Hip Abduction  - 1 x daily - 7 x weekly - 3 sets - 10 reps - Standing Heel Raises  - 1 x daily - 7 x weekly - 3 sets - 10 reps - Heel Toe Raises with Counter Support  - 1 x daily - 7 x weekly - 3 sets - 10 reps - Supine Active Straight Leg Raise  - 2 x daily - 7 x weekly - 2 sets - 10 reps - Standing Terminal Knee Extension with Resistance  - 2 x daily - 7 x weekly - 3 sets - 10 reps - 5 hold - Sit to Stand  - 1 x daily - 7 x weekly - 3 sets - 8 reps                                                                                                                      Gait: 66ft walking without brace, focusing on heel strike and TKE; able  to perform gait without signs of knee buckling  Treatment                            5/31:  SAQ with and without assist Long sitting quad set, both legs contracting side by side SL hip abd SL hip circles Standing lateral weight shift with quad set Standing heel/toe raise- encouraging quad and glut set Mini squats- cues required to stay in the middle of her stance Gait: in brace and with crutches- ext unlocked    5/9: -Changed bandages -Quad set x20 -PROM R knee -Glute sets 5" x15 -Assisted partial SLR x5 -Gait with bilateral crutches, cues for NWB through RLE  DATE: EVAL 08/21/22  Changed bandages Quad set Fit brace Gait training with bil axillary crutches    PATIENT EDUCATION:  Education details: Anatomy of condition, POC, HEP, exercise form/rationale Person educated:  Patient Education method: Explanation, Demonstration, Tactile cues, Verbal cues, and Handouts Education comprehension: verbalized understanding, returned demonstration, verbal cues required, tactile cues required, and needs further education  HOME EXERCISE PROGRAM: [id number]   ASSESSMENT:  CLINICAL IMPRESSION: Pt able to progress flexion greatly since last session and able to start progressive strengthening without issue. Pt able to reach 0 deg knee ext as well. Pt does continue to have off-weighting of the R with DL squat but able to correct with above parallel depth. Pt HEP progressed to include wall isometric holds in order to promote more R quad activation. Pt progressing very well with therapy and is in early strength phase of protocol.  Plan to continue per protocol. Pt would benefit from continued skilled therapy in order to reach goals and maximize functional R LE strength and ROM for full return to PLOF.    OBJECTIVE IMPAIRMENTS: Abnormal gait, decreased activity tolerance, decreased balance, decreased knowledge of use of DME, difficulty walking, decreased ROM, decreased strength, increased edema, increased muscle spasms, impaired flexibility, impaired sensation, improper body mechanics, postural dysfunction, and pain.   ACTIVITY LIMITATIONS: carrying, lifting, bending, sitting, standing, squatting, stairs, transfers, bed mobility, bathing, dressing, and locomotion level  PARTICIPATION LIMITATIONS: meal prep, cleaning, laundry, driving, shopping, and community activity  PERSONAL FACTORS: 3+ comorbidities: see PMH  are also affecting patient's functional outcome.   REHAB POTENTIAL: Good  CLINICAL DECISION MAKING: Stable/uncomplicated  EVALUATION COMPLEXITY: Low   GOALS: Goals reviewed with patient? Yes  SHORT TERM GOALS: Target date: 5/31  Advance to full wB pain <=3/10 Baseline:NWB at eval Goal status: achieved  2.  SLR without extension lag Baseline: unable on  5/31 Goal status: not met  3.  Knee ROM 0-90 without pain Baseline: unable at eval Goal status: achieved   LONG TERM GOALS: Target date: POC date  LEFS to at least 60/80 Baseline: see obj Goal status: INITIAL  2.  Able to navigate stairs with proper form Baseline: unable at eval Goal status: INITIAL  3.  MMT hip & knee to age-appropriate levels with hand held dyno Baseline: not appropriate to test at eval Goal status: INITIAL  4.  Single leg balance control, pain <2/10, on stable and unstable surfaces Baseline: unable at eval Goal status: INITIAL  5.  Prepared to return to running & begin gentle plyometric program Baseline: will progress as appropraite Goal status: INITIAL   PLAN:  PT FREQUENCY: 1-2x/week  PT DURATION: through POC date  PLANNED INTERVENTIONS: Therapeutic exercises, Therapeutic activity, Neuromuscular re-education, Balance training, Gait training, Patient/Family education, Self Care, Joint mobilization, Stair  training, DME instructions, Aquatic Therapy, Dry Needling, Electrical stimulation, Spinal mobilization, Cryotherapy, Moist heat, scar mobilization, Taping, Manual therapy, and Re-evaluation.  PLAN FOR NEXT SESSION: continue per protocol  Zebedee Iba PT, DPT 10/16/22 11:56 AM    Check all possible CPT codes: 40102 - PT Re-evaluation, 97110- Therapeutic Exercise, [phone]- Neuro Re-education, [phone] - Gait Training, [phone] - Manual Therapy, [phone] - Therapeutic Activities, [phone] - Self Care, [phone] - Electrical stimulation (unattended), [phone] - Physical performance training, and [phone] - Aquatic therapy    Check all conditions that are expected to impact treatment: {Conditions expected to impact treatment:None of these apply   If treatment provided at initial evaluation, no treatment charged due to lack of authorization.

## 2022-10-21 ENCOUNTER — Ambulatory Visit (HOSPITAL_BASED_OUTPATIENT_CLINIC_OR_DEPARTMENT_OTHER): Payer: MEDICAID | Attending: Orthopaedic Surgery | Admitting: Physical Therapy

## 2022-10-21 ENCOUNTER — Encounter (HOSPITAL_BASED_OUTPATIENT_CLINIC_OR_DEPARTMENT_OTHER): Payer: Self-pay | Admitting: Physical Therapy

## 2022-10-21 DIAGNOSIS — M25661 Stiffness of right knee, not elsewhere classified: Secondary | ICD-10-CM | POA: Insufficient documentation

## 2022-10-21 DIAGNOSIS — R262 Difficulty in walking, not elsewhere classified: Secondary | ICD-10-CM | POA: Diagnosis present

## 2022-10-21 DIAGNOSIS — M25561 Pain in right knee: Secondary | ICD-10-CM | POA: Insufficient documentation

## 2022-10-21 DIAGNOSIS — M6281 Muscle weakness (generalized): Secondary | ICD-10-CM | POA: Diagnosis present

## 2022-10-21 NOTE — Therapy (Signed)
OUTPATIENT PHYSICAL THERAPY EVALUATION   Patient Name: Cheryl Tran MRN: [id number] DOB:[birth year], 32 y.o., female Today's Date: 10/21/2022  END OF SESSION:  PT End of Session - 10/21/22 1035     Visit Number 7    Number of Visits 33    Date for PT Re-Evaluation 12/11/22    Authorization Type [employer]    PT Start Time 1040    PT Stop Time 1120    PT Time Calculation (min) 40 min    Activity Tolerance Patient tolerated treatment well    Behavior During Therapy WFL for tasks assessed/performed                Past Medical History:  Diagnosis Date   Anxiety    Asthma    Bipolar 1 disorder (HCC)    Depression    Past Surgical History:  Procedure Laterality Date   KNEE ARTHROSCOPY WITH ANTERIOR CRUCIATE LIGAMENT (ACL) REPAIR Right 08/18/2022   Procedure: RIGHT KNEE ARTHROSCOPY WITH ANTERIOR CRUCIATE LIGAMENT (ACL) REPAIR;  Surgeon: Huel Cote, MD;  Location: [facility];  Service: Orthopedics;  Laterality: Right;   KNEE ARTHROSCOPY WITH MACI CARTILAGE HARVEST Right 06/22/2022   Procedure: RIGHT KNEE ARTHROSCOPY PATELLA CHONDROPLASTY  WITH MACI CARTILAGE HARVEST;  Surgeon: Huel Cote, MD;  Location: [facility];  Service: Orthopedics;  Laterality: Right;   OSTEOCHONDRAL DEFECT REPAIR/RECONSTRUCTION Right 08/18/2022   Procedure: OSTEOCHONDRAL DEFECT REPAIR/RECONSTRUCTION/MACI IMPLANTATION;  Surgeon: Huel Cote, MD;  Location: [facility];  Service: Orthopedics;  Laterality: Right;   Patient Active Problem List   Diagnosis Date Noted   Chondral defect of right patella 08/18/2022   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, right 08/18/2022   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 06/22/2022   Bipolar 1 disorder, mixed, moderate (HCC) 05/12/2022   PTSD (post-traumatic stress disorder) 05/12/2022   Anxiety state 05/12/2022   PCOS (polycystic ovarian syndrome) 04/29/2022   Asthma 03/04/1990     REFERRING PROVIDER: Steward Drone,  MD  REFERRING DIAG:   M23.91 (ICD-10-CM) - Ligamentous laxity of right knee  RIGHT KNEE ARTHROSCOPY WITH ANTERIOR CRUCIATE LIGAMENT (ACL) REPAIR / MACI CARTILAGE IMPLANTATION  Rationale for Evaluation and Treatment: Rehabilitation  THERAPY DIAG:  Muscle weakness (generalized)  Acute pain of right knee  Difficulty in walking, not elsewhere classified  Stiffness of right knee, not elsewhere classified  ONSET DATE: DOS 4/30  Days since surgery: 64    SUBJECTIVE:  SUBJECTIVE STATEMENT:  Pt states she was sore after last session into her thigh but no pain. Pt states it was swollen for a day and then it went back down again.   PERTINENT HISTORY:  PTDS, currently moving, anxiety  PAIN:  Are you having pain? No: NPRS scale: 0/10 Pain location: right knee Pain description: sore Aggravating factors: constant Relieving factors: ice PRECAUTIONS: Knee  WEIGHT BEARING RESTRICTIONS: N/A at this time  FALLS:  Has patient fallen in last 6 months? No  PATIENT GOALS: walk, stairs, decr pain   OBJECTIVE:   PATIENT SURVEYS:  LEFS 10/80    GAIT: EVAL: arrived in [facility] with bil axillary crutches   Body Part #1 Knee  PALPATION: EVAL: incision well- healing, good patellar mobility  LE MMT LOWER EXTREMITY MMT:  at eval- able to demo slight twitch in quad activation  MMT Right 6/28   Hip flexion    Hip extension    Hip abduction    Hip adduction    Hip internal rotation    Hip external rotation    Knee flexion 110   Knee extension 0   Ankle dorsiflexion    Ankle plantarflexion    Ankle inversion    Ankle eversion     (Blank rows = not tested)    TREATMENT:    7/3  Recumbent bike 6 min full revolutions   Lateral step down 6" box 3x8 Goblet squat to table 10lbs  Airex SLS  with reach 3x5 LAQ 10lbs 3x8  SLS squat to table 3x8 (L LE touching ground) 6" box lateral step down 3x5  6/28  Recumbent bike 6 min full revolutions   Goblet squat 5lbs 3x8  SLR 5lbs 3x10 Prone HS curl 3x10 5lbs  Prone quad stretch 30s 3x with strap SLS on airex with 10lb KB pass 3x10 Wall isometric lunge hold 15s 4x  Shuttle leg press 50 lbs SL 4x6  6/19  Upright bike 5 min full revolutions   Self scar massage  SLR 5lbs 3x10 Prone HS curl 3x10 5lbs  Prone quad stretch 30s 3x with strap Step up 8" 3x10 Lateral step down 4" 3x10 SLS on airex 30s 4x Sidestepping GTB at knees 54ft 3x laps   6/7   Exercises - Sidelying Hip Abduction  - 1 x daily - 7 x weekly - 3 sets - 10 reps - Standing Heel Raises  - 1 x daily - 7 x weekly - 3 sets - 10 reps - Heel Toe Raises with Counter Support  - 1 x daily - 7 x weekly - 3 sets - 10 reps - Supine Active Straight Leg Raise  - 2 x daily - 7 x weekly - 2 sets - 10 reps - Standing Terminal Knee Extension with Resistance  - 2 x daily - 7 x weekly - 3 sets - 10 reps - 5 hold - Sit to Stand  - 1 x daily - 7 x weekly - 3 sets - 8 reps  Gait: 73ft walking without brace, focusing on heel strike and TKE; able to perform gait without signs of knee buckling  Treatment                            5/31:  SAQ with and without assist Long sitting quad set, both legs contracting side by side SL hip abd SL hip circles Standing lateral weight shift with quad set Standing heel/toe raise- encouraging quad and glut set Mini squats- cues required to stay in the middle of her stance Gait: in brace and with crutches- ext unlocked    5/9: -Changed bandages -Quad set x20 -PROM R knee -Glute sets 5" x15 -Assisted partial SLR x5 -Gait with bilateral crutches, cues for NWB through RLE  DATE: EVAL 08/21/22  Changed bandages Quad  set Fit brace Gait training with bil axillary crutches    PATIENT EDUCATION:  Education details: Anatomy of condition, POC, HEP, exercise form/rationale Person educated: Patient Education method: Explanation, Demonstration, Tactile cues, Verbal cues, and Handouts Education comprehension: verbalized understanding, returned demonstration, verbal cues required, tactile cues required, and needs further education  HOME EXERCISE PROGRAM: [id number]   ASSESSMENT:  CLINICAL IMPRESSION: Pt without increase in pain today with R LE strength. Pt does have most difficulty with R SL activity with limited eccentric quad strength. Pt does demo hip compensation with heel tap movement due to quad weakness. Heavy VC required for knee strategy. Pt HEP not updated in terms of movement but pt advised to increase weight or depth of movement. If no rebound effusion from today's session, consider weight carry, weight step ups, and more dynamic balance to increase step count.  Plan to continue per strength portion of protocol. Pt would benefit from continued skilled therapy in order to reach goals and maximize functional R LE strength and ROM for full return to PLOF.    OBJECTIVE IMPAIRMENTS: Abnormal gait, decreased activity tolerance, decreased balance, decreased knowledge of use of DME, difficulty walking, decreased ROM, decreased strength, increased edema, increased muscle spasms, impaired flexibility, impaired sensation, improper body mechanics, postural dysfunction, and pain.   ACTIVITY LIMITATIONS: carrying, lifting, bending, sitting, standing, squatting, stairs, transfers, bed mobility, bathing, dressing, and locomotion level  PARTICIPATION LIMITATIONS: meal prep, cleaning, laundry, driving, shopping, and community activity  PERSONAL FACTORS: 3+ comorbidities: see PMH  are also affecting patient's functional outcome.   REHAB POTENTIAL: Good  CLINICAL DECISION MAKING: Stable/uncomplicated  EVALUATION  COMPLEXITY: Low   GOALS: Goals reviewed with patient? Yes  SHORT TERM GOALS: Target date: 5/31  Advance to full wB pain <=3/10 Baseline:NWB at eval Goal status: achieved  2.  SLR without extension lag Baseline: unable on 5/31 Goal status: not met  3.  Knee ROM 0-90 without pain Baseline: unable at eval Goal status: achieved   LONG TERM GOALS: Target date: POC date  LEFS to at least 60/80 Baseline: see obj Goal status: INITIAL  2.  Able to navigate stairs with proper form Baseline: unable at eval Goal status: INITIAL  3.  MMT hip & knee to age-appropriate levels with hand held dyno Baseline: not appropriate to test at eval Goal status: INITIAL  4.  Single leg balance control, pain <2/10, on stable and unstable surfaces Baseline: unable at eval Goal status: INITIAL  5.  Prepared to return to running & begin gentle plyometric program Baseline: will progress as appropraite Goal status: INITIAL   PLAN:  PT FREQUENCY: 1-2x/week  PT DURATION: through POC date  PLANNED INTERVENTIONS: Therapeutic exercises, Therapeutic activity, Neuromuscular re-education, Balance training, Gait training, Patient/Family education, Self Care, Joint mobilization, Stair training, DME instructions, Aquatic Therapy, Dry Needling, Electrical stimulation, Spinal mobilization, Cryotherapy, Moist heat, scar mobilization, Taping, Manual therapy, and Re-evaluation.  PLAN FOR NEXT SESSION: continue per protocol  Zebedee Iba PT, DPT 10/21/22 11:46 AM    Check all possible CPT codes: 40981 - PT Re-evaluation, 97110- Therapeutic Exercise, [phone]- Neuro Re-education, [phone] - Gait Training, [phone] - Manual Therapy, [phone] - Therapeutic Activities, [phone] - Self Care, [phone] - Electrical stimulation (unattended), [phone] - Physical performance training, and [phone] - Aquatic therapy    Check all conditions that are expected to impact treatment: {Conditions expected to impact treatment:None of these  apply   If treatment provided at initial evaluation, no treatment charged due to lack of authorization.

## 2022-10-23 ENCOUNTER — Encounter (HOSPITAL_BASED_OUTPATIENT_CLINIC_OR_DEPARTMENT_OTHER): Payer: Self-pay | Admitting: Physical Therapy

## 2022-10-23 ENCOUNTER — Ambulatory Visit (HOSPITAL_BASED_OUTPATIENT_CLINIC_OR_DEPARTMENT_OTHER): Payer: MEDICAID | Admitting: Physical Therapy

## 2022-10-23 DIAGNOSIS — M6281 Muscle weakness (generalized): Secondary | ICD-10-CM

## 2022-10-23 DIAGNOSIS — M25561 Pain in right knee: Secondary | ICD-10-CM

## 2022-10-23 DIAGNOSIS — M25661 Stiffness of right knee, not elsewhere classified: Secondary | ICD-10-CM

## 2022-10-23 DIAGNOSIS — R262 Difficulty in walking, not elsewhere classified: Secondary | ICD-10-CM

## 2022-10-23 NOTE — Therapy (Signed)
OUTPATIENT PHYSICAL THERAPY EVALUATION   Patient Name: Cheryl Tran MRN: [id number] DOB:[birth year], 32 y.o., female Today's Date: 10/23/2022  END OF SESSION:  PT End of Session - 10/23/22 1038     Visit Number 8    Number of Visits 33    Date for PT Re-Evaluation 12/11/22    Authorization Type [employer]    PT Start Time 1058    PT Stop Time 1140    PT Time Calculation (min) 42 min    Activity Tolerance Patient tolerated treatment well    Behavior During Therapy WFL for tasks assessed/performed                 Past Medical History:  Diagnosis Date   Anxiety    Asthma    Bipolar 1 disorder (HCC)    Depression    Past Surgical History:  Procedure Laterality Date   KNEE ARTHROSCOPY WITH ANTERIOR CRUCIATE LIGAMENT (ACL) REPAIR Right 08/18/2022   Procedure: RIGHT KNEE ARTHROSCOPY WITH ANTERIOR CRUCIATE LIGAMENT (ACL) REPAIR;  Surgeon: Huel Cote, MD;  Location: [facility];  Service: Orthopedics;  Laterality: Right;   KNEE ARTHROSCOPY WITH MACI CARTILAGE HARVEST Right 06/22/2022   Procedure: RIGHT KNEE ARTHROSCOPY PATELLA CHONDROPLASTY  WITH MACI CARTILAGE HARVEST;  Surgeon: Huel Cote, MD;  Location: [facility];  Service: Orthopedics;  Laterality: Right;   OSTEOCHONDRAL DEFECT REPAIR/RECONSTRUCTION Right 08/18/2022   Procedure: OSTEOCHONDRAL DEFECT REPAIR/RECONSTRUCTION/MACI IMPLANTATION;  Surgeon: Huel Cote, MD;  Location: [facility];  Service: Orthopedics;  Laterality: Right;   Patient Active Problem List   Diagnosis Date Noted   Chondral defect of right patella 08/18/2022   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, right 08/18/2022   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 06/22/2022   Bipolar 1 disorder, mixed, moderate (HCC) 05/12/2022   PTSD (post-traumatic stress disorder) 05/12/2022   Anxiety state 05/12/2022   PCOS (polycystic ovarian syndrome) 04/29/2022   Asthma 03/04/1990     REFERRING PROVIDER: Steward Drone,  MD  REFERRING DIAG:   M23.91 (ICD-10-CM) - Ligamentous laxity of right knee  RIGHT KNEE ARTHROSCOPY WITH ANTERIOR CRUCIATE LIGAMENT (ACL) REPAIR / MACI CARTILAGE IMPLANTATION  Rationale for Evaluation and Treatment: Rehabilitation  THERAPY DIAG:  Muscle weakness (generalized)  Acute pain of right knee  Difficulty in walking, not elsewhere classified  Stiffness of right knee, not elsewhere classified  ONSET DATE: DOS 4/30  Days since surgery: 66    SUBJECTIVE:  SUBJECTIVE STATEMENT:  Pt states that she was very sore after last session into her quad but no pain; no increase in swelling.   PERTINENT HISTORY:  PTDS, currently moving, anxiety  PAIN:  Are you having pain? No: NPRS scale: 0/10 Pain location: right knee Pain description: sore Aggravating factors: constant Relieving factors: ice PRECAUTIONS: Knee  WEIGHT BEARING RESTRICTIONS: N/A at this time  FALLS:  Has patient fallen in last 6 months? No  PATIENT GOALS: walk, stairs, decr pain   OBJECTIVE:   PATIENT SURVEYS:  LEFS 10/80    Body Part #1 Knee  PALPATION: EVAL: incision well- healing, good patellar mobility  LE MMT LOWER EXTREMITY MMT:  at eval- able to demo slight twitch in quad activation  MMT Right 7/5   Hip flexion    Hip extension    Hip abduction    Hip adduction    Hip internal rotation    Hip external rotation    Knee flexion 115   Knee extension 0   Ankle dorsiflexion    Ankle plantarflexion    Ankle inversion    Ankle eversion     (Blank rows = not tested)    TREATMENT:    7/5 Recumbent bike 6 min full revolutions; level 7  Bosu SLS 30s 4x  Bosu squat 3x10 Prone quad stretch 30s 3x SL bridge 3x10 15lb step ups 2x10 Lateral step up 2x10. Tandem airex balance beam 6x laps  Airex  balance beam lateral march 6x laps    7/3  Recumbent bike 6 min full revolutions   Lateral step down 6" box 3x8 Goblet squat to table 10lbs  Airex SLS with reach 3x5 LAQ 10lbs 3x8  SLS squat to table 3x8 (L LE touching ground) 6" box lateral step down 3x5  6/28  Recumbent bike 6 min full revolutions   Goblet squat 5lbs 3x8  SLR 5lbs 3x10 Prone HS curl 3x10 5lbs  Prone quad stretch 30s 3x with strap SLS on airex with 10lb KB pass 3x10 Wall isometric lunge hold 15s 4x  Shuttle leg press 50 lbs SL 4x6  6/19  Upright bike 5 min full revolutions   Self scar massage  SLR 5lbs 3x10 Prone HS curl 3x10 5lbs  Prone quad stretch 30s 3x with strap Step up 8" 3x10 Lateral step down 4" 3x10 SLS on airex 30s 4x Sidestepping GTB at knees 4ft 3x laps   6/7   Exercises - Sidelying Hip Abduction  - 1 x daily - 7 x weekly - 3 sets - 10 reps - Standing Heel Raises  - 1 x daily - 7 x weekly - 3 sets - 10 reps - Heel Toe Raises with Counter Support  - 1 x daily - 7 x weekly - 3 sets - 10 reps - Supine Active Straight Leg Raise  - 2 x daily - 7 x weekly - 2 sets - 10 reps - Standing Terminal Knee Extension with Resistance  - 2 x daily - 7 x weekly - 3 sets - 10 reps - 5 hold - Sit to Stand  - 1 x daily - 7 x weekly - 3 sets - 8 reps  Gait: 41ft walking without brace, focusing on heel strike and TKE; able to perform gait without signs of knee buckling  Treatment                            5/31:  SAQ with and without assist Long sitting quad set, both legs contracting side by side SL hip abd SL hip circles Standing lateral weight shift with quad set Standing heel/toe raise- encouraging quad and glut set Mini squats- cues required to stay in the middle of her stance Gait: in brace and with crutches- ext unlocked    5/9: -Changed bandages -Quad set x20 -PROM  R knee -Glute sets 5" x15 -Assisted partial SLR x5 -Gait with bilateral crutches, cues for NWB through RLE  DATE: EVAL 08/21/22  Changed bandages Quad set Fit brace Gait training with bil axillary crutches    PATIENT EDUCATION:  Education details: Anatomy of condition, POC, HEP, exercise form/rationale Person educated: Patient Education method: Explanation, Demonstration, Tactile cues, Verbal cues, and Handouts Education comprehension: verbalized understanding, returned demonstration, verbal cues required, tactile cues required, and needs further education  HOME EXERCISE PROGRAM: [id number]   ASSESSMENT:  CLINICAL IMPRESSION: Pt with increase in soreness of last session that appears consistent with DOMS. No report of swelling in the knee or observed swelling. Pt session focused on stability of the R LE with increase in fatigue with continued repetitions.  Pt is still largely weak and apprehensive with the R LE. Plan to continue per strength portion of protocol and incorporate more proprioceptive exercise. No pain noted during session outside of muscle burning fatigue. Pt would benefit from continued skilled therapy in order to reach goals and maximize functional R LE strength and ROM for full return to PLOF.    OBJECTIVE IMPAIRMENTS: Abnormal gait, decreased activity tolerance, decreased balance, decreased knowledge of use of DME, difficulty walking, decreased ROM, decreased strength, increased edema, increased muscle spasms, impaired flexibility, impaired sensation, improper body mechanics, postural dysfunction, and pain.   ACTIVITY LIMITATIONS: carrying, lifting, bending, sitting, standing, squatting, stairs, transfers, bed mobility, bathing, dressing, and locomotion level  PARTICIPATION LIMITATIONS: meal prep, cleaning, laundry, driving, shopping, and community activity  PERSONAL FACTORS: 3+ comorbidities: see PMH  are also affecting patient's functional outcome.   REHAB  POTENTIAL: Good  CLINICAL DECISION MAKING: Stable/uncomplicated  EVALUATION COMPLEXITY: Low   GOALS: Goals reviewed with patient? Yes  SHORT TERM GOALS: Target date: 5/31  Advance to full wB pain <=3/10 Baseline:NWB at eval Goal status: achieved  2.  SLR without extension lag Baseline: unable on 5/31 Goal status: not met  3.  Knee ROM 0-90 without pain Baseline: unable at eval Goal status: achieved   LONG TERM GOALS: Target date: POC date  LEFS to at least 60/80 Baseline: see obj Goal status: INITIAL  2.  Able to navigate stairs with proper form Baseline: unable at eval Goal status: INITIAL  3.  MMT hip & knee to age-appropriate levels with hand held dyno Baseline: not appropriate to test at eval Goal status: INITIAL  4.  Single leg balance control, pain <2/10, on stable and unstable surfaces Baseline: unable at eval Goal status: INITIAL  5.  Prepared to return to running & begin gentle plyometric program Baseline: will progress as appropraite Goal status: INITIAL   PLAN:  PT FREQUENCY: 1-2x/week  PT DURATION: through POC date  PLANNED INTERVENTIONS: Therapeutic exercises, Therapeutic activity, Neuromuscular re-education, Balance training, Gait training, Patient/Family education, Self Care,  Joint mobilization, Stair training, DME instructions, Aquatic Therapy, Dry Needling, Electrical stimulation, Spinal mobilization, Cryotherapy, Moist heat, scar mobilization, Taping, Manual therapy, and Re-evaluation.  PLAN FOR NEXT SESSION: continue per protocol  Zebedee Iba PT, DPT 10/23/22 11:47 AM    Check all possible CPT codes: 40981 - PT Re-evaluation, 97110- Therapeutic Exercise, [phone]- Neuro Re-education, [phone] - Gait Training, [phone] - Manual Therapy, [phone] - Therapeutic Activities, [phone] - Self Care, [phone] - Electrical stimulation (unattended), [phone] - Physical performance training, and [phone] - Aquatic therapy    Check all conditions that are expected to  impact treatment: {Conditions expected to impact treatment:None of these apply   If treatment provided at initial evaluation, no treatment charged due to lack of authorization.

## 2022-10-28 ENCOUNTER — Ambulatory Visit (HOSPITAL_BASED_OUTPATIENT_CLINIC_OR_DEPARTMENT_OTHER): Payer: MEDICAID | Admitting: Physical Therapy

## 2022-10-30 ENCOUNTER — Ambulatory Visit (HOSPITAL_BASED_OUTPATIENT_CLINIC_OR_DEPARTMENT_OTHER): Payer: MEDICAID | Admitting: Physical Therapy

## 2022-11-04 ENCOUNTER — Encounter (HOSPITAL_BASED_OUTPATIENT_CLINIC_OR_DEPARTMENT_OTHER): Payer: Self-pay | Admitting: Physical Therapy

## 2022-11-04 ENCOUNTER — Ambulatory Visit (HOSPITAL_BASED_OUTPATIENT_CLINIC_OR_DEPARTMENT_OTHER): Payer: MEDICAID | Admitting: Physical Therapy

## 2022-11-04 DIAGNOSIS — M6281 Muscle weakness (generalized): Secondary | ICD-10-CM | POA: Diagnosis not present

## 2022-11-04 DIAGNOSIS — M25561 Pain in right knee: Secondary | ICD-10-CM

## 2022-11-04 DIAGNOSIS — M25661 Stiffness of right knee, not elsewhere classified: Secondary | ICD-10-CM

## 2022-11-04 DIAGNOSIS — R262 Difficulty in walking, not elsewhere classified: Secondary | ICD-10-CM

## 2022-11-04 NOTE — Therapy (Addendum)
OUTPATIENT PHYSICAL THERAPY  PHYSICAL THERAPY DISCHARGE SUMMARY  Visits from Start of Care: 9  Plan: Patient agrees to discharge.  Patient goals were not met. Patient is being discharged due to not returning to therapy.        Patient Name: Cheryl Tran MRN: [id number] DOB:1990-08-12, 32 y.o., female Today's Date: 11/04/2022  END OF SESSION:  PT End of Session - 11/04/22 1106     Visit Number 9    Number of Visits 33    Date for PT Re-Evaluation 12/11/22    Authorization Type M[employer]    PT Start Time 1102    PT Stop Time 1141    PT Time Calculation (min) 39 min    Activity Tolerance Patient tolerated treatment well    Behavior During Therapy WFL for tasks assessed/performed                 Past Medical History:  Diagnosis Date   Anxiety    Asthma    Bipolar 1 disorder (HCC)    Depression    Past Surgical History:  Procedure Laterality Date   KNEE ARTHROSCOPY WITH ANTERIOR CRUCIATE LIGAMENT (ACL) REPAIR Right 08/18/2022   Procedure: RIGHT KNEE ARTHROSCOPY WITH ANTERIOR CRUCIATE LIGAMENT (ACL) REPAIR;  Surgeon: Huel Cote, MD;  Location: [facility];  Service: Orthopedics;  Laterality: Right;   KNEE ARTHROSCOPY WITH MACI CARTILAGE HARVEST Right 06/22/2022   Procedure: RIGHT KNEE ARTHROSCOPY PATELLA CHONDROPLASTY  WITH MACI CARTILAGE HARVEST;  Surgeon: Huel Cote, MD;  Location: [facility];  Service: Orthopedics;  Laterality: Right;   OSTEOCHONDRAL DEFECT REPAIR/RECONSTRUCTION Right 08/18/2022   Procedure: OSTEOCHONDRAL DEFECT REPAIR/RECONSTRUCTION/MACI IMPLANTATION;  Surgeon: Huel Cote, MD;  Location: [facility];  Service: Orthopedics;  Laterality: Right;   Patient Active Problem List   Diagnosis Date Noted   Chondral defect of right patella 08/18/2022   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, right 08/18/2022   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 06/22/2022   Bipolar 1 disorder, mixed, moderate (HCC)  05/12/2022   PTSD (post-traumatic stress disorder) 05/12/2022   Anxiety state 05/12/2022   PCOS (polycystic ovarian syndrome) 04/29/2022   Asthma 03/04/1990     REFERRING PROVIDER: Steward Drone, MD  REFERRING DIAG:   M23.91 (ICD-10-CM) - Ligamentous laxity of right knee  RIGHT KNEE ARTHROSCOPY WITH ANTERIOR CRUCIATE LIGAMENT (ACL) REPAIR / MACI CARTILAGE IMPLANTATION  Rationale for Evaluation and Treatment: Rehabilitation  THERAPY DIAG:  Muscle weakness (generalized)  Acute pain of right knee  Difficulty in walking, not elsewhere classified  Stiffness of right knee, not elsewhere classified  ONSET DATE: DOS 4/30  Days since surgery: 78    SUBJECTIVE:  SUBJECTIVE STATEMENT:  Pt states she probably did too much over the weekend. She did have to jump at one point and "ran around the inside of my house." Pt denies injury, swelling, or pain since.   PERTINENT HISTORY:  PTDS, currently moving, anxiety  PAIN:  Are you having pain? No: NPRS scale: 0/10 Pain location: right knee Pain description: sore Aggravating factors: constant Relieving factors: ice PRECAUTIONS: Knee  WEIGHT BEARING RESTRICTIONS: N/A at this time  FALLS:  Has patient fallen in last 6 months? No  PATIENT GOALS: walk, stairs, decr pain   OBJECTIVE:   PATIENT SURVEYS:  LEFS 10/80    Body Part #1 Knee  PALPATION: EVAL: incision well- healing, good patellar mobility  LE MMT LOWER EXTREMITY MMT:  at eval- able to demo slight twitch in quad activation  MMT Right 7/5   Hip flexion    Hip extension    Hip abduction    Hip adduction    Hip internal rotation    Hip external rotation    Knee flexion 115   Knee extension 0   Ankle dorsiflexion    Ankle plantarflexion    Ankle inversion    Ankle eversion      (Blank rows = not tested)    TREATMENT:    7/17  Recumbent bike 6 min full revolutions; level 7  Shuttle leg press SL 75lbs 4x8 Tall plank to toe taps 3x8  Prone HS curl machine 3x10 30lbs  Knee ext machine DL 4x8 96EAV    7/5 Recumbent bike 6 min full revolutions; level 7  Bosu SLS 30s 4x  Bosu squat 3x10 Prone quad stretch 30s 3x SL bridge 3x10 15lb step ups 2x10 Lateral step up 2x10. Tandem airex balance beam 6x laps  Airex balance beam lateral march 6x laps    7/3  Recumbent bike 6 min full revolutions   Lateral step down 6" box 3x8 Goblet squat to table 10lbs  Airex SLS with reach 3x5 LAQ 10lbs 3x8  SLS squat to table 3x8 (L LE touching ground) 6" box lateral step down 3x5  6/28  Recumbent bike 6 min full revolutions   Goblet squat 5lbs 3x8  SLR 5lbs 3x10 Prone HS curl 3x10 5lbs  Prone quad stretch 30s 3x with strap SLS on airex with 10lb KB pass 3x10 Wall isometric lunge hold 15s 4x  Shuttle leg press 50 lbs SL 4x6  6/19  Upright bike 5 min full revolutions   Self scar massage  SLR 5lbs 3x10 Prone HS curl 3x10 5lbs  Prone quad stretch 30s 3x with strap Step up 8" 3x10 Lateral step down 4" 3x10 SLS on airex 30s 4x Sidestepping GTB at knees 52ft 3x laps   6/7   Exercises - Sidelying Hip Abduction  - 1 x daily - 7 x weekly - 3 sets - 10 reps - Standing Heel Raises  - 1 x daily - 7 x weekly - 3 sets - 10 reps - Heel Toe Raises with Counter Support  - 1 x daily - 7 x weekly - 3 sets - 10 reps - Supine Active Straight Leg Raise  - 2 x daily - 7 x weekly - 2 sets - 10 reps - Standing Terminal Knee Extension with Resistance  - 2 x daily - 7 x weekly - 3 sets - 10 reps - 5 hold - Sit to Stand  - 1 x daily - 7 x weekly - 3 sets - 8 reps  Gait: 27ft walking without brace, focusing on heel strike and TKE; able to perform gait  without signs of knee buckling  Treatment                            5/31:  SAQ with and without assist Long sitting quad set, both legs contracting side by side SL hip abd SL hip circles Standing lateral weight shift with quad set Standing heel/toe raise- encouraging quad and glut set Mini squats- cues required to stay in the middle of her stance Gait: in brace and with crutches- ext unlocked    5/9: -Changed bandages -Quad set x20 -PROM R knee -Glute sets 5" x15 -Assisted partial SLR x5 -Gait with bilateral crutches, cues for NWB through RLE  DATE: EVAL 08/21/22  Changed bandages Quad set Fit brace Gait training with bil axillary crutches    PATIENT EDUCATION:  Education details: Anatomy of condition, POC, HEP, exercise form/rationale Person educated: Patient Education method: Explanation, Demonstration, Tactile cues, Verbal cues, and Handouts Education comprehension: verbalized understanding, returned demonstration, verbal cues required, tactile cues required, and needs further education  HOME EXERCISE PROGRAM: [id number]   ASSESSMENT:  CLINICAL IMPRESSION: Pt strength session progressed to machine based exercise as pt has access to HS curl and knee ext apparatus at home. Pt was able to progress both volume and intensity of loading at the RLE. Pt without pain at the anterior knee during session. Pt is progressing well in strength phase. SL stability and strength is still lacking as expected  at this time. Continue quad, HS, and hip strength as tolerated at future session. Gait and ROM are normalized at this time. Pt would benefit from continued skilled therapy in order to reach goals and maximize functional R LE strength and ROM for full return to PLOF.    OBJECTIVE IMPAIRMENTS: Abnormal gait, decreased activity tolerance, decreased balance, decreased knowledge of use of DME, difficulty walking, decreased ROM, decreased strength, increased edema, increased muscle  spasms, impaired flexibility, impaired sensation, improper body mechanics, postural dysfunction, and pain.   ACTIVITY LIMITATIONS: carrying, lifting, bending, sitting, standing, squatting, stairs, transfers, bed mobility, bathing, dressing, and locomotion level  PARTICIPATION LIMITATIONS: meal prep, cleaning, laundry, driving, shopping, and community activity  PERSONAL FACTORS: 3+ comorbidities: see PMH  are also affecting patient's functional outcome.   REHAB POTENTIAL: Good  CLINICAL DECISION MAKING: Stable/uncomplicated  EVALUATION COMPLEXITY: Low   GOALS: Goals reviewed with patient? Yes  SHORT TERM GOALS: Target date: 5/31  Advance to full wB pain <=3/10 Baseline:NWB at eval Goal status: achieved  2.  SLR without extension lag Baseline: unable on 5/31 Goal status: not met  3.  Knee ROM 0-90 without pain Baseline: unable at eval Goal status: achieved   LONG TERM GOALS: Target date: POC date  LEFS to at least 60/80 Baseline: see obj Goal status: INITIAL  2.  Able to navigate stairs with proper form Baseline: unable at eval Goal status: INITIAL  3.  MMT hip & knee to age-appropriate levels with hand held dyno Baseline: not appropriate to test at eval Goal status: INITIAL  4.  Single leg balance control, pain <2/10, on stable and unstable surfaces Baseline: unable at eval Goal status: INITIAL  5.  Prepared to return to running & begin gentle plyometric program Baseline: will progress as appropraite Goal status: INITIAL   PLAN:  PT FREQUENCY: 1-2x/week  PT DURATION: through POC date  PLANNED INTERVENTIONS: Therapeutic exercises, Therapeutic activity, Neuromuscular  re-education, Balance training, Gait training, Patient/Family education, Self Care, Joint mobilization, Stair training, DME instructions, Aquatic Therapy, Dry Needling, Electrical stimulation, Spinal mobilization, Cryotherapy, Moist heat, scar mobilization, Taping, Manual therapy, and  Re-evaluation.  PLAN FOR NEXT SESSION: continue per protocol  Zebedee Iba PT, DPT 11/04/22 11:41 AM    Check all possible CPT codes: 82956 - PT Re-evaluation, 97110- Therapeutic Exercise, [phone]- Neuro Re-education, [phone] - Gait Training, [phone] - Manual Therapy, [phone] - Therapeutic Activities, [phone] - Self Care, [phone] - Electrical stimulation (unattended), [phone] - Physical performance training, and [phone] - Aquatic therapy    Check all conditions that are expected to impact treatment: {Conditions expected to impact treatment:None of these apply   If treatment provided at initial evaluation, no treatment charged due to lack of authorization.

## 2022-11-06 ENCOUNTER — Ambulatory Visit (HOSPITAL_BASED_OUTPATIENT_CLINIC_OR_DEPARTMENT_OTHER): Payer: MEDICAID | Admitting: Physical Therapy

## 2022-11-09 ENCOUNTER — Telehealth (HOSPITAL_BASED_OUTPATIENT_CLINIC_OR_DEPARTMENT_OTHER): Payer: Self-pay | Admitting: Physical Therapy

## 2022-11-09 ENCOUNTER — Ambulatory Visit (HOSPITAL_BASED_OUTPATIENT_CLINIC_OR_DEPARTMENT_OTHER): Payer: MEDICAID | Admitting: Physical Therapy

## 2022-11-09 NOTE — Telephone Encounter (Signed)
Spoke with pt this AM telling her that her appointment would continue as scheduled and pt verbalized understanding to arrive at 11:00 today. Pt did not arrive for the appointment.   Diamond Martucci C. Bryne Lindon PT, DPT 11/09/22 3:30 PM

## 2022-11-12 ENCOUNTER — Telehealth (HOSPITAL_BASED_OUTPATIENT_CLINIC_OR_DEPARTMENT_OTHER): Payer: Self-pay | Admitting: Physical Therapy

## 2022-11-12 ENCOUNTER — Ambulatory Visit (HOSPITAL_BASED_OUTPATIENT_CLINIC_OR_DEPARTMENT_OTHER): Payer: MEDICAID | Admitting: Physical Therapy

## 2022-11-12 NOTE — Telephone Encounter (Signed)
LVM advising that she No Show for 2 appointments this week. I am cancelling all Friday appointments and maintaining Wednesdays as scheduled. Requested call back with questions.   Raquel Sayres C. Fernado Brigante PT, DPT 11/12/22 12:33 PM

## 2022-11-18 ENCOUNTER — Ambulatory Visit (HOSPITAL_BASED_OUTPATIENT_CLINIC_OR_DEPARTMENT_OTHER): Payer: MEDICAID | Admitting: Physical Therapy

## 2022-11-18 ENCOUNTER — Encounter (HOSPITAL_BASED_OUTPATIENT_CLINIC_OR_DEPARTMENT_OTHER): Payer: Self-pay | Admitting: Physical Therapy

## 2022-11-20 ENCOUNTER — Encounter (HOSPITAL_BASED_OUTPATIENT_CLINIC_OR_DEPARTMENT_OTHER): Payer: Medicaid Other | Admitting: Physical Therapy

## 2022-11-25 ENCOUNTER — Encounter (HOSPITAL_BASED_OUTPATIENT_CLINIC_OR_DEPARTMENT_OTHER): Payer: Medicaid Other | Admitting: Physical Therapy

## 2022-11-27 ENCOUNTER — Encounter (HOSPITAL_BASED_OUTPATIENT_CLINIC_OR_DEPARTMENT_OTHER): Payer: Medicaid Other | Admitting: Physical Therapy

## 2022-12-02 ENCOUNTER — Encounter (HOSPITAL_BASED_OUTPATIENT_CLINIC_OR_DEPARTMENT_OTHER): Payer: Medicaid Other | Admitting: Physical Therapy

## 2022-12-04 ENCOUNTER — Encounter (HOSPITAL_BASED_OUTPATIENT_CLINIC_OR_DEPARTMENT_OTHER): Payer: Medicaid Other | Admitting: Physical Therapy

## 2022-12-09 ENCOUNTER — Encounter (HOSPITAL_BASED_OUTPATIENT_CLINIC_OR_DEPARTMENT_OTHER): Payer: Medicaid Other | Admitting: Physical Therapy

## 2022-12-11 ENCOUNTER — Encounter (HOSPITAL_BASED_OUTPATIENT_CLINIC_OR_DEPARTMENT_OTHER): Payer: Medicaid Other

## 2022-12-16 ENCOUNTER — Encounter (HOSPITAL_BASED_OUTPATIENT_CLINIC_OR_DEPARTMENT_OTHER): Payer: Medicaid Other | Admitting: Physical Therapy

## 2022-12-18 ENCOUNTER — Encounter (HOSPITAL_BASED_OUTPATIENT_CLINIC_OR_DEPARTMENT_OTHER): Payer: Medicaid Other

## 2023-02-02 ENCOUNTER — Other Ambulatory Visit: Payer: Self-pay

## 2023-02-06 ENCOUNTER — Encounter (HOSPITAL_COMMUNITY)
Admission: EM | Discharge status: Against Medical Advice | Payer: Self-pay | Source: Home / Self Care | Attending: Pulmonary Disease

## 2023-02-06 ENCOUNTER — Emergency Department (HOSPITAL_COMMUNITY): Payer: MEDICAID | Admitting: Anesthesiology

## 2023-02-06 ENCOUNTER — Emergency Department (HOSPITAL_COMMUNITY): Payer: MEDICAID

## 2023-02-06 ENCOUNTER — Inpatient Hospital Stay (HOSPITAL_COMMUNITY)
Admission: EM | Admit: 2023-02-06 | Discharge: 2023-02-07 | DRG: 003 | Discharge status: Against Medical Advice | Payer: MEDICAID | Attending: Student in an Organized Health Care Education/Training Program | Admitting: Student in an Organized Health Care Education/Training Program

## 2023-02-06 ENCOUNTER — Encounter (HOSPITAL_COMMUNITY): Payer: Self-pay | Admitting: Emergency Medicine

## 2023-02-06 ENCOUNTER — Other Ambulatory Visit: Payer: Self-pay

## 2023-02-06 DIAGNOSIS — J384 Edema of larynx: Secondary | ICD-10-CM | POA: Diagnosis present

## 2023-02-06 DIAGNOSIS — Z8249 Family history of ischemic heart disease and other diseases of the circulatory system: Secondary | ICD-10-CM | POA: Diagnosis not present

## 2023-02-06 DIAGNOSIS — T884XXA Failed or difficult intubation, initial encounter: Secondary | ICD-10-CM | POA: Diagnosis present

## 2023-02-06 DIAGNOSIS — F4310 Post-traumatic stress disorder, unspecified: Secondary | ICD-10-CM | POA: Diagnosis present

## 2023-02-06 DIAGNOSIS — J45909 Unspecified asthma, uncomplicated: Secondary | ICD-10-CM | POA: Diagnosis present

## 2023-02-06 DIAGNOSIS — R0689 Other abnormalities of breathing: Secondary | ICD-10-CM

## 2023-02-06 DIAGNOSIS — Z803 Family history of malignant neoplasm of breast: Secondary | ICD-10-CM | POA: Diagnosis not present

## 2023-02-06 DIAGNOSIS — Z818 Family history of other mental and behavioral disorders: Secondary | ICD-10-CM | POA: Diagnosis not present

## 2023-02-06 DIAGNOSIS — Z930 Tracheostomy status: Secondary | ICD-10-CM

## 2023-02-06 DIAGNOSIS — F17290 Nicotine dependence, other tobacco product, uncomplicated: Secondary | ICD-10-CM | POA: Diagnosis present

## 2023-02-06 DIAGNOSIS — T8149XA Infection following a procedure, other surgical site, initial encounter: Secondary | ICD-10-CM | POA: Diagnosis present

## 2023-02-06 DIAGNOSIS — E876 Hypokalemia: Secondary | ICD-10-CM | POA: Diagnosis present

## 2023-02-06 DIAGNOSIS — E282 Polycystic ovarian syndrome: Secondary | ICD-10-CM | POA: Diagnosis present

## 2023-02-06 DIAGNOSIS — Z7951 Long term (current) use of inhaled steroids: Secondary | ICD-10-CM

## 2023-02-06 DIAGNOSIS — Z8489 Family history of other specified conditions: Secondary | ICD-10-CM | POA: Diagnosis not present

## 2023-02-06 DIAGNOSIS — Z7984 Long term (current) use of oral hypoglycemic drugs: Secondary | ICD-10-CM

## 2023-02-06 DIAGNOSIS — K122 Cellulitis and abscess of mouth: Secondary | ICD-10-CM | POA: Diagnosis not present

## 2023-02-06 DIAGNOSIS — J390 Retropharyngeal and parapharyngeal abscess: Secondary | ICD-10-CM

## 2023-02-06 DIAGNOSIS — Z5986 Financial insecurity: Secondary | ICD-10-CM | POA: Diagnosis not present

## 2023-02-06 DIAGNOSIS — Z833 Family history of diabetes mellitus: Secondary | ICD-10-CM

## 2023-02-06 DIAGNOSIS — F319 Bipolar disorder, unspecified: Secondary | ICD-10-CM | POA: Diagnosis present

## 2023-02-06 DIAGNOSIS — Z7982 Long term (current) use of aspirin: Secondary | ICD-10-CM

## 2023-02-06 DIAGNOSIS — Z832 Family history of diseases of the blood and blood-forming organs and certain disorders involving the immune mechanism: Secondary | ICD-10-CM

## 2023-02-06 DIAGNOSIS — Z79899 Other long term (current) drug therapy: Secondary | ICD-10-CM | POA: Diagnosis not present

## 2023-02-06 DIAGNOSIS — Z8349 Family history of other endocrine, nutritional and metabolic diseases: Secondary | ICD-10-CM | POA: Diagnosis not present

## 2023-02-06 DIAGNOSIS — F17210 Nicotine dependence, cigarettes, uncomplicated: Secondary | ICD-10-CM | POA: Diagnosis present

## 2023-02-06 HISTORY — DX: Retropharyngeal and parapharyngeal abscess: J39.0

## 2023-02-06 HISTORY — PX: TONSILLECTOMY AND ADENOIDECTOMY: SHX28

## 2023-02-06 HISTORY — PX: TRACHEOSTOMY TUBE PLACEMENT: SHX814

## 2023-02-06 HISTORY — DX: Failed or difficult intubation, initial encounter: T88.4XXA

## 2023-02-06 HISTORY — DX: Tracheostomy status: Z93.0

## 2023-02-06 LAB — CBC WITH DIFFERENTIAL/PLATELET
Abs Immature Granulocytes: 0.1 10*3/uL — ABNORMAL HIGH (ref 0.00–0.07)
Basophils Absolute: 0 10*3/uL (ref 0.0–0.1)
Basophils Relative: 0 %
Eosinophils Absolute: 0 10*3/uL (ref 0.0–0.5)
Eosinophils Relative: 0 %
HCT: 41.7 % (ref 36.0–46.0)
Hemoglobin: 13.6 g/dL (ref 12.0–15.0)
Immature Granulocytes: 1 %
Lymphocytes Relative: 8 %
Lymphs Abs: 1.3 10*3/uL (ref 0.7–4.0)
MCH: 27.9 pg (ref 26.0–34.0)
MCHC: 32.6 g/dL (ref 30.0–36.0)
MCV: 85.5 fL (ref 80.0–100.0)
Monocytes Absolute: 1.1 10*3/uL — ABNORMAL HIGH (ref 0.1–1.0)
Monocytes Relative: 7 %
Neutro Abs: 14.6 10*3/uL — ABNORMAL HIGH (ref 1.7–7.7)
Neutrophils Relative %: 84 %
Platelets: 315 10*3/uL (ref 150–400)
RBC: 4.88 MIL/uL (ref 3.87–5.11)
RDW: 13.5 % (ref 11.5–15.5)
WBC: 17.2 10*3/uL — ABNORMAL HIGH (ref 4.0–10.5)
nRBC: 0 % (ref 0.0–0.2)

## 2023-02-06 LAB — COMPREHENSIVE METABOLIC PANEL
ALT: 12 U/L (ref 0–44)
AST: 17 U/L (ref 15–41)
Albumin: 3.4 g/dL — ABNORMAL LOW (ref 3.5–5.0)
Alkaline Phosphatase: 49 U/L (ref 38–126)
Anion gap: 15 (ref 5–15)
BUN: 5 mg/dL — ABNORMAL LOW (ref 6–20)
CO2: 22 mmol/L (ref 22–32)
Calcium: 9.3 mg/dL (ref 8.9–10.3)
Chloride: 100 mmol/L (ref 98–111)
Creatinine, Ser: 0.82 mg/dL (ref 0.44–1.00)
GFR, Estimated: >60 mL/min (ref >60)
Glucose, Bld: 117 mg/dL — ABNORMAL HIGH (ref 70–99)
Potassium: 3.4 mmol/L — ABNORMAL LOW (ref 3.5–5.1)
Sodium: 137 mmol/L (ref 135–145)
Total Bilirubin: 0.8 mg/dL (ref 0.3–1.2)
Total Protein: 6.9 g/dL (ref 6.5–8.1)

## 2023-02-06 LAB — GLUCOSE, CAPILLARY
Glucose-Capillary: 108 mg/dL — ABNORMAL HIGH (ref 70–99)
Glucose-Capillary: 85 mg/dL (ref 70–99)
Glucose-Capillary: 120 mg/dL — ABNORMAL HIGH (ref 70–99)

## 2023-02-06 LAB — PROTIME-INR
INR: 1.2 (ref 0.8–1.2)
Prothrombin Time: 15.5 s — ABNORMAL HIGH (ref 11.4–15.2)

## 2023-02-06 LAB — HCG, SERUM, QUALITATIVE: Preg, Serum: NEGATIVE

## 2023-02-06 LAB — MRSA NEXT GEN BY PCR, NASAL: MRSA by PCR Next Gen: NOT DETECTED

## 2023-02-06 LAB — ABO/RH: ABO/RH(D): A POS

## 2023-02-06 LAB — HEMOGLOBIN A1C
Hgb A1c MFr Bld: 5.2 % (ref 4.8–5.6)
Mean Plasma Glucose: 102.54 mg/dL

## 2023-02-06 LAB — TYPE AND SCREEN
ABO/RH(D): A POS
Antibody Screen: NEGATIVE

## 2023-02-06 SURGERY — CREATION, TRACHEOSTOMY
Anesthesia: General | Site: Neck

## 2023-02-06 MED ORDER — LIDOCAINE-EPINEPHRINE 1 %-1:100000 IJ SOLN
INTRAMUSCULAR | Status: AC
  Filled 2023-02-06: qty 1

## 2023-02-06 MED ORDER — 0.9 % SODIUM CHLORIDE (POUR BTL) OPTIME
TOPICAL | Status: DC | PRN
  Administered 2023-02-06: 1000 mL

## 2023-02-06 MED ORDER — ORAL CARE MOUTH RINSE: 15.0000 mL | OROMUCOSAL | Status: DC | PRN

## 2023-02-06 MED ORDER — KETAMINE HCL 50 MG/5ML IJ SOSY
PREFILLED_SYRINGE | INTRAMUSCULAR | Status: AC
  Filled 2023-02-06: qty 5

## 2023-02-06 MED ORDER — GLYCOPYRROLATE 0.2 MG/ML IJ SOLN
INTRAMUSCULAR | Status: DC | PRN
Start: 2023-02-06 — End: 2023-02-06
  Administered 2023-02-06 (×2): .1 mg via INTRAVENOUS

## 2023-02-06 MED ORDER — ONDANSETRON HCL 4 MG/2ML IJ SOLN
INTRAMUSCULAR | Status: DC | PRN
  Administered 2023-02-06: 4 mg via INTRAVENOUS

## 2023-02-06 MED ORDER — LACTATED RINGERS IV SOLN
INTRAVENOUS | Status: DC | PRN
Start: 2023-02-06 — End: 2023-02-06

## 2023-02-06 MED ORDER — DEXMEDETOMIDINE HCL IN NACL 80 MCG/20ML IV SOLN
INTRAVENOUS | Status: DC | PRN
Start: 2023-02-06 — End: 2023-02-06
  Administered 2023-02-06 (×3): 8 ug via INTRAVENOUS

## 2023-02-06 MED ORDER — FENTANYL CITRATE PF 50 MCG/ML IJ SOSY
25.0000 ug | PREFILLED_SYRINGE | INTRAMUSCULAR | Status: DC | PRN
  Administered 2023-02-06 (×2): 50 ug via INTRAVENOUS
  Filled 2023-02-06 (×2): qty 1

## 2023-02-06 MED ORDER — SODIUM CHLORIDE 0.9 % IV SOLN
3.0000 g | Freq: Four times a day (QID) | INTRAVENOUS | Status: DC
  Administered 2023-02-06 – 2023-02-07 (×4): 3 g via INTRAVENOUS
  Filled 2023-02-06 (×4): qty 8

## 2023-02-06 MED ORDER — IOHEXOL 350 MG/ML SOLN
75.0000 mL | Freq: Once | INTRAVENOUS | Status: AC | PRN
  Administered 2023-02-06: 75 mL via INTRAVENOUS

## 2023-02-06 MED ORDER — ORAL CARE MOUTH RINSE
15.0000 mL | OROMUCOSAL | Status: DC
  Administered 2023-02-06 – 2023-02-07 (×3): 15 mL via OROMUCOSAL

## 2023-02-06 MED ORDER — PROPOFOL 10 MG/ML IV BOLUS
INTRAVENOUS | Status: DC | PRN
  Administered 2023-02-06 (×2): 100 mg via INTRAVENOUS

## 2023-02-06 MED ORDER — OXYMETAZOLINE HCL 0.05 % NA SOLN
NASAL | Status: AC
  Filled 2023-02-06: qty 30

## 2023-02-06 MED ORDER — SUCCINYLCHOLINE CHLORIDE 200 MG/10ML IV SOSY
PREFILLED_SYRINGE | INTRAVENOUS | Status: AC
  Filled 2023-02-06: qty 10

## 2023-02-06 MED ORDER — ROCURONIUM BROMIDE 10 MG/ML (PF) SYRINGE
PREFILLED_SYRINGE | INTRAVENOUS | Status: DC | PRN
  Administered 2023-02-06: 30 mg via INTRAVENOUS

## 2023-02-06 MED ORDER — PROPOFOL 10 MG/ML IV BOLUS
INTRAVENOUS | Status: AC
  Filled 2023-02-06: qty 20

## 2023-02-06 MED ORDER — DEXAMETHASONE SODIUM PHOSPHATE 10 MG/ML IJ SOLN
INTRAMUSCULAR | Status: DC | PRN
  Administered 2023-02-06: 10 mg via INTRAVENOUS

## 2023-02-06 MED ORDER — INSULIN ASPART 100 UNIT/ML IJ SOLN: 2.0000 [IU] | INTRAMUSCULAR | Status: DC

## 2023-02-06 MED ORDER — CHLORHEXIDINE GLUCONATE 0.12 % MT SOLN: 15.0000 mL | Freq: Once | OROMUCOSAL | Status: DC

## 2023-02-06 MED ORDER — ALBUTEROL SULFATE (2.5 MG/3ML) 0.083% IN NEBU
2.5000 mg | INHALATION_SOLUTION | RESPIRATORY_TRACT | Status: DC | PRN
  Administered 2023-02-06 – 2023-02-07 (×2): 2.5 mg via RESPIRATORY_TRACT
  Filled 2023-02-06 (×2): qty 3

## 2023-02-06 MED ORDER — ONDANSETRON HCL 4 MG/2ML IJ SOLN
INTRAMUSCULAR | Status: AC
  Filled 2023-02-06: qty 2

## 2023-02-06 MED ORDER — SUCCINYLCHOLINE CHLORIDE 200 MG/10ML IV SOSY
PREFILLED_SYRINGE | INTRAVENOUS | Status: DC | PRN
  Administered 2023-02-06: 200 mg via INTRAVENOUS

## 2023-02-06 MED ORDER — POTASSIUM CHLORIDE 10 MEQ/100ML IV SOLN
10.0000 meq | INTRAVENOUS | Status: AC
  Administered 2023-02-06 (×3): 10 meq via INTRAVENOUS
  Filled 2023-02-06 (×3): qty 100

## 2023-02-06 MED ORDER — SODIUM CHLORIDE 0.9 % IV SOLN
3.0000 g | Freq: Once | INTRAVENOUS | Status: AC
  Administered 2023-02-06: 3 g via INTRAVENOUS
  Filled 2023-02-06: qty 8

## 2023-02-06 MED ORDER — FENTANYL CITRATE (PF) 250 MCG/5ML IJ SOLN
INTRAMUSCULAR | Status: AC
  Filled 2023-02-06: qty 5

## 2023-02-06 MED ORDER — DEXAMETHASONE SODIUM PHOSPHATE 10 MG/ML IJ SOLN
10.0000 mg | Freq: Once | INTRAMUSCULAR | Status: AC
  Administered 2023-02-06: 10 mg via INTRAVENOUS
  Filled 2023-02-06: qty 1

## 2023-02-06 MED ORDER — OXYCODONE HCL 5 MG PO TABS: 5.0000 mg | ORAL_TABLET | Freq: Once | ORAL | Status: DC | PRN

## 2023-02-06 MED ORDER — ONDANSETRON HCL 4 MG/2ML IJ SOLN: 4.0000 mg | Freq: Four times a day (QID) | INTRAMUSCULAR | Status: DC | PRN

## 2023-02-06 MED ORDER — DEXAMETHASONE SODIUM PHOSPHATE 10 MG/ML IJ SOLN
10.0000 mg | INTRAMUSCULAR | Status: DC
  Administered 2023-02-07: 10 mg via INTRAVENOUS
  Filled 2023-02-06: qty 1

## 2023-02-06 MED ORDER — LIDOCAINE HCL (PF) 4 % IJ SOLN
5.0000 mL | Freq: Once | INTRAMUSCULAR | Status: AC
  Filled 2023-02-06: qty 5

## 2023-02-06 MED ORDER — FENTANYL CITRATE (PF) 100 MCG/2ML IJ SOLN: 25.0000 ug | INTRAMUSCULAR | Status: DC | PRN

## 2023-02-06 MED ORDER — ALBUTEROL SULFATE (2.5 MG/3ML) 0.083% IN NEBU
2.5000 mg | INHALATION_SOLUTION | Freq: Once | RESPIRATORY_TRACT | Status: AC
  Administered 2023-02-06: 2.5 mg via RESPIRATORY_TRACT
  Filled 2023-02-06: qty 3

## 2023-02-06 MED ORDER — SODIUM CHLORIDE 0.9% FLUSH: 10.0000 mL | Freq: Once | INTRAVENOUS | Status: DC

## 2023-02-06 MED ORDER — VANCOMYCIN HCL 1750 MG/350ML IV SOLN
1750.0000 mg | Freq: Once | INTRAVENOUS | Status: DC
  Filled 2023-02-06: qty 350

## 2023-02-06 MED ORDER — MIDAZOLAM HCL 5 MG/5ML IJ SOLN
INTRAMUSCULAR | Status: DC | PRN
  Administered 2023-02-06 (×2): 1 mg via INTRAVENOUS

## 2023-02-06 MED ORDER — PIPERACILLIN-TAZOBACTAM 3.375 G IVPB 30 MIN: 3.3750 g | Freq: Once | INTRAVENOUS | Status: DC

## 2023-02-06 MED ORDER — ACETAMINOPHEN 10 MG/ML IV SOLN
1000.0000 mg | Freq: Four times a day (QID) | INTRAVENOUS | Status: DC
  Administered 2023-02-06 – 2023-02-07 (×3): 1000 mg via INTRAVENOUS
  Filled 2023-02-06 (×4): qty 100

## 2023-02-06 MED ORDER — ROCURONIUM BROMIDE 10 MG/ML (PF) SYRINGE
PREFILLED_SYRINGE | INTRAVENOUS | Status: AC
  Filled 2023-02-06: qty 10

## 2023-02-06 MED ORDER — KETOROLAC TROMETHAMINE 30 MG/ML IJ SOLN
30.0000 mg | Freq: Once | INTRAMUSCULAR | Status: AC
  Administered 2023-02-06: 30 mg via INTRAVENOUS
  Filled 2023-02-06: qty 1

## 2023-02-06 MED ORDER — OXYCODONE HCL 5 MG/5ML PO SOLN: 5.0000 mg | Freq: Once | ORAL | Status: DC | PRN

## 2023-02-06 MED ORDER — MIDAZOLAM HCL 2 MG/2ML IJ SOLN
INTRAMUSCULAR | Status: AC
  Filled 2023-02-06: qty 2

## 2023-02-06 MED ORDER — FENTANYL CITRATE (PF) 250 MCG/5ML IJ SOLN
INTRAMUSCULAR | Status: DC | PRN
  Administered 2023-02-06: 50 ug via INTRAVENOUS

## 2023-02-06 MED ORDER — LIDOCAINE HCL 4 % EX SOLN
CUTANEOUS | Status: DC | PRN
  Administered 2023-02-06: 5 mL via TOPICAL

## 2023-02-06 MED ORDER — SODIUM CHLORIDE 0.9 % IV BOLUS: 1000.0000 mL | Freq: Once | INTRAVENOUS | Status: AC

## 2023-02-06 MED ORDER — SUGAMMADEX SODIUM 200 MG/2ML IV SOLN
INTRAVENOUS | Status: DC | PRN
  Administered 2023-02-06: 200 mg via INTRAVENOUS

## 2023-02-06 MED ORDER — ORAL CARE MOUTH RINSE: 15.0000 mL | Freq: Once | OROMUCOSAL | Status: DC

## 2023-02-06 MED ORDER — CHLORHEXIDINE GLUCONATE CLOTH 2 % EX PADS
6.0000 | MEDICATED_PAD | Freq: Every day | CUTANEOUS | Status: DC
  Administered 2023-02-06: 6 via TOPICAL

## 2023-02-06 MED ORDER — DEXAMETHASONE SODIUM PHOSPHATE 10 MG/ML IJ SOLN
INTRAMUSCULAR | Status: AC
  Filled 2023-02-06: qty 1

## 2023-02-06 MED ORDER — MORPHINE SULFATE (PF) 4 MG/ML IV SOLN
4.0000 mg | Freq: Once | INTRAVENOUS | Status: AC
  Administered 2023-02-06: 4 mg via INTRAVENOUS
  Filled 2023-02-06: qty 1

## 2023-02-06 SURGICAL SUPPLY — 55 items
BAG COUNTER SPONGE SURGICOUNT (BAG) ×2 IMPLANT
BAG SPNG CNTER NS LX DISP (BAG) ×2
BLADE CLIPPER SURG (BLADE) IMPLANT
BLADE SURG 15 STRL LF DISP TIS (BLADE) IMPLANT
BLADE SURG 15 STRL SS (BLADE)
CANISTER SUCT 3000ML PPV (MISCELLANEOUS) ×2 IMPLANT
CATH ROBINSON RED A/P 10FR (CATHETERS) IMPLANT
CATH ROBINSON RED A/P 12FR (CATHETERS) ×2 IMPLANT
CLEANER TIP ELECTROSURG 2X2 (MISCELLANEOUS) ×2 IMPLANT
COAGULATOR SUCT 8FR VV (MISCELLANEOUS) IMPLANT
COAGULATOR SUCT SWTCH 10FR 6 (ELECTROSURGICAL) IMPLANT
CONT SPEC 4OZ CLIKSEAL STRL BL (MISCELLANEOUS) ×2 IMPLANT
CORD BIPOLAR FORCEPS 12FT (ELECTRODE) ×2 IMPLANT
COVER SURGICAL LIGHT HANDLE (MISCELLANEOUS) ×2 IMPLANT
DRAPE HALF SHEET 40X57 (DRAPES) IMPLANT
ELECT COATED BLADE 2.86 ST (ELECTRODE) ×2 IMPLANT
ELECT REM PT RETURN 9FT ADLT (ELECTROSURGICAL) ×2
ELECT REM PT RETURN 9FT PED (ELECTROSURGICAL)
ELECTRODE NDL INSULATED 6.5 (ELECTROSURGICAL) IMPLANT
ELECTRODE REM PT RETRN 9FT PED (ELECTROSURGICAL) IMPLANT
ELECTRODE REM PT RTRN 9FT ADLT (ELECTROSURGICAL) ×2 IMPLANT
FORCEPS BIPOLAR SPETZLER 8 1.0 (NEUROSURGERY SUPPLIES) ×2 IMPLANT
GAUZE 4X4 16PLY ~~LOC~~+RFID DBL (SPONGE) ×2 IMPLANT
GAUZE SPONGE 4X4 12PLY STRL (GAUZE/BANDAGES/DRESSINGS) IMPLANT
GLOVE BIO SURGEON STRL SZ 6 (GLOVE) ×2 IMPLANT
GLOVE BIO SURGEON STRL SZ7.5 (GLOVE) ×2 IMPLANT
GOWN STRL REUS W/ TWL LRG LVL3 (GOWN DISPOSABLE) ×4 IMPLANT
GOWN STRL REUS W/TWL LRG LVL3 (GOWN DISPOSABLE) ×4
HEMOSTAT ARISTA ABSORB 3G PWDR (HEMOSTASIS) IMPLANT
KIT BASIN OR (CUSTOM PROCEDURE TRAY) ×2 IMPLANT
KIT TURNOVER KIT B (KITS) ×2 IMPLANT
NS IRRIG 1000ML POUR BTL (IV SOLUTION) ×2 IMPLANT
PACK BASIC III (CUSTOM PROCEDURE TRAY) ×2
PACK SRG BSC III STRL LF ECLPS (CUSTOM PROCEDURE TRAY) ×2 IMPLANT
PAD ARMBOARD 7.5X6 YLW CONV (MISCELLANEOUS) ×4 IMPLANT
PATTIES SURGICAL .5 X3 (DISPOSABLE) ×2 IMPLANT
PENCIL BUTTON HOLSTER BLD 10FT (ELECTRODE) ×2 IMPLANT
PENCIL SMOKE EVACUATOR (MISCELLANEOUS) ×2 IMPLANT
POSITIONER HEAD DONUT 9IN (MISCELLANEOUS) ×2 IMPLANT
SPONGE DRAIN TRACH 4X4 STRL 2S (GAUZE/BANDAGES/DRESSINGS) IMPLANT
SPONGE TONSIL 1.25 RF SGL STRG (GAUZE/BANDAGES/DRESSINGS) IMPLANT
SURGILUBE 2OZ TUBE FLIPTOP (MISCELLANEOUS) IMPLANT
SUT CHROMIC 3 0 PS 2 (SUTURE) ×2 IMPLANT
SUT CHROMIC 3 0 SH 27 (SUTURE) IMPLANT
SUT PROLENE 2 0 SH 30 (SUTURE) ×4 IMPLANT
SUT SILK 2 0 SH (SUTURE) IMPLANT
SYR BULB EAR ULCER 3OZ GRN STR (SYRINGE) ×2 IMPLANT
SYR CONTROL 10ML LL (SYRINGE) ×2 IMPLANT
TOWEL GREEN STERILE FF (TOWEL DISPOSABLE) ×2 IMPLANT
TRAY ENT MC OR (CUSTOM PROCEDURE TRAY) ×2 IMPLANT
TUBE CONNECTING 12X1/4 (SUCTIONS) ×2 IMPLANT
TUBE SALEM SUMP 16F (TUBING) ×2 IMPLANT
TUBE TRACH 6.0 CUFF FLEX (MISCELLANEOUS) IMPLANT
WATER STERILE IRR 1000ML POUR (IV SOLUTION) ×2 IMPLANT
YANKAUER SUCT BULB TIP NO VENT (SUCTIONS) ×2 IMPLANT

## 2023-02-06 NOTE — Progress Notes (Signed)
Pharmacy Antibiotic Note  Cheryl Tran is a 32 y.o. female admitted on 02/06/2023 with swelling and difficulty breathing s/p wisdom teeth extraction 4 days PTA.  First doses of vancomycin and Zosyn ordered but haven't been given.  Patient was given Unasyn prior to drainage and trach today 10/19.  Pharmacy has been consulted for Unasyn dosing.  Renal function stable, afebrile, WBC elevated at 17.2.  Plan: D/C first doses of vanc and Zosyn Unasyn 3gm IV Q6H Pharmacy will sign off with stable renal function   Height: 5\' 8"  (172.7 cm) Weight: 96.3 kg (212 lb 4.9 oz) IBW/kg (Calculated) : 63.9  Temp (24hrs), Avg:98.3 F (36.8 C), Min:97.8 F (36.6 C), Max:98.9 F (37.2 C)  Recent Labs  Lab 02/06/23 0358  WBC 17.2*  CREATININE 0.82    Estimated Creatinine Clearance: 118.5 mL/min (by C-G formula based on SCr of 0.82 mg/dL).    No Known Allergies  Unasyn 10/19 >>  10/19 BCx -   Viliami Bracco D. Laney Potash, PharmD, BCPS, BCCCP 02/06/2023, 1:11 PM

## 2023-02-06 NOTE — Anesthesia Procedure Notes (Signed)
Date/Time: 02/06/2023 11:19 AM  Performed by: Lovie Chol, CRNAInduction Type: Tracheostomy

## 2023-02-06 NOTE — H&P (Addendum)
Cheryl Tran, MRN:  [id number], DOB:  1990-07-02, LOS: 0 ADMISSION DATE:  02/06/2023, CONSULTATION DATE:  02/06/23 REFERRING MD:  Irene Pap - ENT, CHIEF COMPLAINT:  critical airway, pharyngeal abscess     History of Present Illness:  Hx from chart review.   32yo F PMH PCOS, bipolar 1, PTSD, smoking, who had wisdom teeth removed 10/15 and started to have swelling, painful swallowing, drooling, phonation changes and difficulties breathing since 10/18 amd presented to ED 10/19 with worse SOB.  Sent for STAT CT soft tissue neck which revealed extensive soft tissue infection with R pharyngeal and retropharyngeal abscess. Received unasyn x1 and 10mg  decadron. ENT laryngoscopy in ED also significant for airway narrowing, R vocal cord is sluggish, pooling of secretions. Taken to OR urgently for abscess drainage and trach   Admit to ICU following   Pertinent  Medical History  PCOS Bipolar 1 PTSD Smoking Asthma  Wisdom teeth extraction   Significant Hospital Events: Including procedures, antibiotic start and stop dates in addition to other pertinent events   10/19 OR w ENT for abscess drainage and trach   Interim History / Subjective:  POD0  Objective   Blood pressure (!) 164/131, pulse (!) 116, temperature 98.1 F (36.7 C), temperature source Oral, resp. rate 20, height 5\' 8"  (1.727 m), weight 81.6 kg, last menstrual period 01/28/2023, SpO2 100%.        Intake/Output Summary (Last 24 hours) at 02/06/2023 1141 Last data filed at 02/06/2023 1135 Gross per 24 hour  Intake 300 ml  Output 6 ml  Net 294 ml   Filed Weights   02/06/23 1018  Weight: 81.6 kg    Examination: General: ill appearing adult F  HENT: Facial swelling. 6 cuffed trach. No crepitus.  Lungs: CTAb  Cardiovascular: rrr Abdomen: soft ndnt  Extremities: no obvious acute joint deformity  Neuro: sedate GU: defer  Resolved Hospital Problem list     Assessment & Plan:   R pharyngeal/masticator   abscess Airway narrowing s/p tracheostomy by ENT  Hx Asthma -6 cuffed shiley  P -admit to ICU  -NPO -ENT plans to deflate cuff 10/20 and hopefully to cuffless POD3 -decadron -have restarted unasyn. D/w ID who agrees as there are not any clear MRSA risk factors. Will swab for MRSA PCR.  -PRN analgesia  -follow micro  -follow CBC fever curve  -PRN albuterol  Hypokalemia -replace  -AM BMP   Bipolar 1 PTSD  -seroquel 150, gabapentin 300 TID, prazosin 1mg  at bedtime when taking POs  Best Practice (right click and "Reselect all SmartList Selections" daily)   Diet/type: NPO DVT prophylaxis: SCD GI prophylaxis: N/A Lines: N/A Foley:  N/A Code Status:  full code Last date of multidisciplinary goals of care discussion [--]  Labs   CBC: Recent Labs  Lab 02/06/23 0358  WBC 17.2*  NEUTROABS 14.6*  HGB 13.6  HCT 41.7  MCV 85.5  PLT 315    Basic Metabolic Panel: Recent Labs  Lab 02/06/23 0358  NA 137  K 3.4*  CL 100  CO2 22  GLUCOSE 117*  BUN 5*  CREATININE 0.82  CALCIUM 9.3   GFR: Estimated Creatinine Clearance: 109.4 mL/min (by C-G formula based on SCr of 0.82 mg/dL). Recent Labs  Lab 02/06/23 0358  WBC 17.2*    Liver Function Tests: Recent Labs  Lab 02/06/23 0358  AST 17  ALT 12  ALKPHOS 49  BILITOT 0.8  PROT 6.9  ALBUMIN 3.4*   No results for input(s): "LIPASE", "AMYLASE"  in the last 168 hours. No results for input(s): "AMMONIA" in the last 168 hours.  ABG No results found for: "PHART", "PCO2ART", "PO2ART", "HCO3", "TCO2", "ACIDBASEDEF", "O2SAT"   Coagulation Profile: Recent Labs  Lab 02/06/23 0930  INR 1.2    Cardiac Enzymes: No results for input(s): "CKTOTAL", "CKMB", "CKMBINDEX", "TROPONINI" in the last 168 hours.  HbA1C: Hgb A1c MFr Bld  Date/Time Value Ref Range Status  04/24/2022 11:30 AM 5.4 <5.7 % of total Hgb Final    Comment:    For the purpose of screening for the presence of diabetes: . <5.7%       Consistent with  the absence of diabetes 5.7-6.4%    Consistent with increased risk for diabetes             (prediabetes) > or =6.5%  Consistent with diabetes . This assay result is consistent with a decreased risk of diabetes. . Currently, no consensus exists regarding use of hemoglobin A1c for diagnosis of diabetes in children. . According to American Diabetes Association (ADA) guidelines, hemoglobin A1c <7.0% represents optimal control in non-pregnant diabetic patients. Different metrics may apply to specific patient populations.  Standards of Medical Care in Diabetes(ADA). .     CBG: No results for input(s): "GLUCAP" in the last 168 hours.  Review of Systems:   Unable to obtain, post anesthetic   Past Medical History:  She,  has a past medical history of Anxiety, Asthma, Bipolar 1 disorder (HCC), and Depression.   Surgical History:   Past Surgical History:  Procedure Laterality Date   KNEE ARTHROSCOPY WITH ANTERIOR CRUCIATE LIGAMENT (ACL) REPAIR Right 08/18/2022   Procedure: RIGHT KNEE ARTHROSCOPY WITH ANTERIOR CRUCIATE LIGAMENT (ACL) REPAIR;  Surgeon: Huel Cote, MD;  Location: [facility];  Service: Orthopedics;  Laterality: Right;   KNEE ARTHROSCOPY WITH MACI CARTILAGE HARVEST Right 06/22/2022   Procedure: RIGHT KNEE ARTHROSCOPY PATELLA CHONDROPLASTY  WITH MACI CARTILAGE HARVEST;  Surgeon: Huel Cote, MD;  Location: [facility];  Service: Orthopedics;  Laterality: Right;   OSTEOCHONDRAL DEFECT REPAIR/RECONSTRUCTION Right 08/18/2022   Procedure: OSTEOCHONDRAL DEFECT REPAIR/RECONSTRUCTION/MACI IMPLANTATION;  Surgeon: Huel Cote, MD;  Location: [facility];  Service: Orthopedics;  Laterality: Right;     Social History:   reports that she has been smoking cigarettes and cigars. She has never used smokeless tobacco. She reports current alcohol use. She reports current drug use. Drug: Marijuana.   Family History:  Her family history  includes Addison's disease in her mother; Bipolar disorder in her sister; Breast cancer in her maternal grandmother; Diabetes in her father; Luiz Blare' disease in her sister; Hypertension in her father and mother; Hyperthyroidism in her maternal grandmother; Neuropathy in her mother; Pernicious anemia in her mother.   Allergies No Known Allergies   Home Medications  Prior to Admission medications   Medication Sig Start Date End Date Taking? Authorizing Provider  acetaminophen (TYLENOL) 325 MG tablet     [provider]  albuterol (PROVENTIL) (2.5 MG/3ML) 0.083% nebulizer solution Take 3 mLs (2.5 mg total) by nebulization every 6 (six) hours as needed for wheezing or shortness of breath. 04/29/22   Dulce Sellar, NP  albuterol (VENTOLIN HFA) 108 (90 Base) MCG/ACT inhaler Inhale 2 puffs into the lungs every 6 (six) hours as needed for wheezing or shortness of breath. 04/29/22   Dulce Sellar, NP  ARIPiprazole (ABILIFY) 5 MG tablet Take 1 tablet (5 mg total) by mouth daily in the morning. Increase to 2 tablets (10 mg total)  daily after 5 days. 08/13/22     aspirin EC 325 MG tablet Take 1 tablet (325 mg total) by mouth daily. 08/19/22   Huel Cote, MD  clomiPHENE (CLOMID) 50 MG tablet Take 1 tablet (50 mg total) by mouth daily. Take cycle days 3-7 08/11/22   Olivia Mackie, NP  fluticasone (FLOVENT HFA) 44 MCG/ACT inhaler     [provider]  gabapentin (NEURONTIN) 300 MG capsule Take 1 capsule (300 mg total) by mouth 3 (three) times daily. Patient not taking: Reported on 08/11/2022 07/08/22   Shanna Cisco, NP  metFORMIN (GLUCOPHAGE) 500 MG tablet Take 1 tablet (500 mg total) by mouth 2 (two) times daily with a meal. 04/24/22   Olivia Mackie, NP  Oxcarbazepine (TRILEPTAL) 300 MG tablet Take 1 tablet (300 mg total) by mouth 2 (two) times daily. 08/13/22     oxyCODONE (OXY IR/ROXICODONE) 5 MG immediate release tablet Take 1 tablet (5 mg total) by mouth every 4 (four)  hours as needed (severe pain). 09/03/22   Huel Cote, MD  oxyCODONE (ROXICODONE) 5 MG immediate release tablet Take 1 tablet (5 mg total) by mouth every 4 (four) hours as needed for severe pain or breakthrough pain. 08/19/22   Huel Cote, MD  prazosin (MINIPRESS) 2 MG capsule Take 1 capsule (2 mg total) by mouth at bedtime. 08/13/22     SYMBICORT 80-4.5 MCG/ACT inhaler Inhale 2 puffs into the lungs 2 (two) times daily. 04/29/22   Dulce Sellar, NP     Critical care time: 17      Tessie Fass MSN, AGACNP-BC [facility] Pulmonary/Critical Care Medicine Amion for pager  02/06/2023, 1:07 PM

## 2023-02-06 NOTE — Transfer of Care (Signed)
Immediate Anesthesia Transfer of Care Note  Patient: Cheryl Tran  Procedure(s) Performed: FIBEROPTIC INTUBATION WITH TRACHEOSTOMY (Neck) TRANSORAL DRAINAGE ABSCESS (Mouth)  Patient Location: PACU  Anesthesia Type:General  Level of Consciousness: sedated and patient cooperative  Airway & Oxygen Therapy: Patient Spontanous Breathing and Patient connected to tracheostomy mask oxygen  Post-op Assessment: Report given to RN and Post -op Vital signs reviewed and stable  Post vital signs: Reviewed  Last Vitals:  Vitals Value Taken Time  BP 141/92 02/06/23 1200  Temp    Pulse 68 02/06/23 1207  Resp 11 02/06/23 1207  SpO2 100 % 02/06/23 1207  Vitals shown include unfiled device data.  Last Pain:  Vitals:   02/06/23 1018  TempSrc:   PainSc: 4          Complications:  Encounter Notable Events  Notable Event Outcome Phase Comment  Difficult to intubate - expected  Intraprocedure Filed from anesthesia note documentation.

## 2023-02-06 NOTE — Anesthesia Procedure Notes (Addendum)
Procedure Name: Intubation Date/Time: 02/06/2023 10:47 AM  Performed by: Lovie Chol, CRNAPre-anesthesia Checklist: Patient identified, Emergency Drugs available, Suction available, Patient being monitored and Timeout performed Patient Re-evaluated:Patient Re-evaluated prior to induction Oxygen Delivery Method: Nasal cannula Preoxygenation: Pre-oxygenation with 100% oxygen Induction Type: IV induction Laryngoscope Size: Glidescope and 3 Grade View: Grade I Tube type: Oral Tube size: 6.5 mm Number of attempts: 1 Airway Equipment and Method: Stylet and Video-laryngoscopy Placement Confirmation: ETT inserted through vocal cords under direct vision, breath sounds checked- equal and bilateral and CO2 detector Secured at: 22 cm Tube secured with: Tape Dental Injury: Teeth and Oropharynx as per pre-operative assessment  Difficulty Due To: Difficulty was anticipated Comments: Awake intubation due to airway abscess. Video-glide used by Dr Chaney Malling. See chart for meds.

## 2023-02-06 NOTE — Progress Notes (Signed)
ENT Postop Care Instructions   S/p tracheostomy and trans-oral I&D of the right parapharyngeal/masticator space abscess/collection  - 6-0 cuffed Shiley in place - will deflate cuff tomorrow  - follow-up culture results - ID consult  - medical management of odontogenic source pharyngeal infection - abx per ID - trach change to cuffless POD#3 as long as doing better clinically - trend CBC  - ENT will follow  Cheryl Tran

## 2023-02-06 NOTE — Consult Note (Addendum)
ENT CONSULT:  Reason for Consult: Pharyngeal abscess concern for upper airway narrowing  HPI: Cheryl Tran is an 32 y.o. female with hx of asthma, no other medical problems per report, who had upper and lower molar tooth extraction on the right side 5 days ago, and now presents with 2 days of swelling and pain with swallowing, drooling, and reports of difficulties with breathing.  Denies fevers at home.  Did not have antibiotic after the dental extraction.  She endorses muffled voice. CT scan with evidence of extensive soft tissue infection likely arising from one of the right sided molar extraction sites with right parapharyngeal and masticator space collection approximately 3.3 cm in size, smaller collection about 1 cm in retropharyngeal space.  WBC 17.2.   Records Reviewed:  ED nursing documentation Pt states she has difficulty swallowing and drooling. Pt went to bed at 2300 fine and woke up with swelling and having a hard time to breathe. Pt says she started having chills last night.     Past Medical History:  Diagnosis Date   Anxiety    Asthma    Bipolar 1 disorder (HCC)    Depression     Past Surgical History:  Procedure Laterality Date   KNEE ARTHROSCOPY WITH ANTERIOR CRUCIATE LIGAMENT (ACL) REPAIR Right 08/18/2022   Procedure: RIGHT KNEE ARTHROSCOPY WITH ANTERIOR CRUCIATE LIGAMENT (ACL) REPAIR;  Surgeon: Huel Cote, MD;  Location: [facility];  Service: Orthopedics;  Laterality: Right;   KNEE ARTHROSCOPY WITH MACI CARTILAGE HARVEST Right 06/22/2022   Procedure: RIGHT KNEE ARTHROSCOPY PATELLA CHONDROPLASTY  WITH MACI CARTILAGE HARVEST;  Surgeon: Huel Cote, MD;  Location: [facility];  Service: Orthopedics;  Laterality: Right;   OSTEOCHONDRAL DEFECT REPAIR/RECONSTRUCTION Right 08/18/2022   Procedure: OSTEOCHONDRAL DEFECT REPAIR/RECONSTRUCTION/MACI IMPLANTATION;  Surgeon: Huel Cote, MD;  Location: [facility];  Service:  Orthopedics;  Laterality: Right;    Family History  Problem Relation Age of Onset   Neuropathy Mother    Addison's disease Mother    Hypertension Mother    Pernicious anemia Mother    Hypertension Father    Diabetes Father    Graves' disease Sister    Bipolar disorder Sister    Breast cancer Maternal Grandmother    Hyperthyroidism Maternal Grandmother     Social History:  reports that she has been smoking cigarettes and cigars. She has never used smokeless tobacco. She reports current alcohol use. She reports current drug use. Drug: Marijuana.  Allergies: No Known Allergies  Medications: I have reviewed the patient's current medications.  The PMH, PSH, Medications, Allergies, and SH were reviewed and updated.  ROS: Constitutional: Negative for fever, weight loss and weight gain. Cardiovascular: Negative for chest pain and dyspnea on exertion. Respiratory: Is not experiencing shortness of breath at rest. Gastrointestinal: Negative for nausea and vomiting. Neurological: Negative for headaches. Psychiatric: The patient is not nervous/anxious Pertinent positives per HPI  Blood pressure (!) 143/88, pulse 97, temperature 98.1 F (36.7 C), temperature source Oral, resp. rate 20, SpO2 100%.  PHYSICAL EXAM:  Exam: General: Well-developed, well-nourished Communication and Voice: Muffled and wet voice Respiratory Respiratory effort: Equal inspiration and expiration without stridor Cardiovascular Peripheral Vascular: Warm extremities with equal color/perfusion Eyes: No nystagmus with equal extraocular motion bilaterally Neuro/Psych/Balance: Patient oriented to person, place, and time; Appropriate mood and affect; Gait is intact with no imbalance; Cranial nerves I-XII are intact Head and Face Inspection: Normocephalic and atraumatic without mass or lesion Palpation: Facial skeleton intact without bony stepoffs  Salivary Glands: No mass or tenderness Facial Strength: Facial  motility symmetric and full bilaterally ENT Pinna: External ear intact and fully developed External canal: Canal is patent with intact skin Tympanic Membrane: Clear and mobile External Nose: No scar or anatomic deformity Internal Nose: Septum is deviated to the left. No polyp, or purulence. Mucosal edema and erythema present.  Bilateral inferior turbinate hypertrophy.  Lips, Teeth, and gums: Mucosa and teeth intact and viable, evidence of more tooth extraction on the right side Oral cavity/oropharynx: No erythema or exudate, no lesions present, evidence of edema and erythema along the soft palate and retromolar trigone area on the right, no significant trismus.  Molar tooth extraction site without drainage.  Nasopharynx: Right pharyngeal wall bulging at the level of the nasopharynx due to infection Hypopharynx:  pooling of secretions Larynx  Evidence of bulging along the right pharyngeal space starting at the level of the soft palate extending down to the level of the tip of the epiglottis, with airway narrowing, vocal folds appear to be mobile but the right vocal fold is sluggish with abduction, unclear if due to mass effect from pharyngeal collection or infection affecting right recurrent laryngeal nerve.  Evidence of mild supraglottic edema but glottic aperture is patent and visible.  Pooling of secretions in piriforms. Neck Neck and Trachea: Midline trachea without mass or lesion, palpable anterior neck landmarks Thyroid: No mass or nodularity   Procedure: Preoperative diagnosis: Pharyngeal abscess odontogenic source infection and upper airway obstruction  Postoperative diagnosis:   Same  Procedure: Flexible fiberoptic laryngoscopy  Surgeon: Ashok Croon, MD  Anesthesia: Topical lidocaine and Afrin Complications: None Condition is stable throughout exam  Indications and consent:  The patient presents to the clinic with Indirect laryngoscopy view was incomplete. Thus it was  recommended that they undergo a flexible fiberoptic laryngoscopy. All of the risks, benefits, and potential complications were reviewed with the patient preoperatively and verbal informed consent was obtained.  Procedure: The patient was seated upright in the clinic. Topical lidocaine and Afrin were applied to the nasal cavity. After adequate anesthesia had occurred, I then proceeded to pass the flexible telescope into the nasal cavity. The nasal cavity was patent without rhinorrhea or polyp. The nasopharynx was also patent without mass or lesion. The base of tongue was visualized and was normal. There were no signs of pooling of secretions in the piriform sinuses. The true vocal folds were mobile bilaterally. There were no signs of glottic or supraglottic mucosal lesion or mass. There was moderate interarytenoid pachydermia and post cricoid edema.  Please see summary of findings below for additional findings during exam the telescope was then slowly withdrawn and the patient tolerated the procedure throughout.   Flexible laryngoscopy findings: Evidence of bulging along the right pharyngeal space starting at the level of the soft palate extending down to the level of the tip of the epiglottis, with airway narrowing, vocal folds appear to be mobile but the right vocal fold is sluggish with abduction, unclear if due to mass effect from pharyngeal collection or infection affecting right recurrent laryngeal nerve.  Evidence of mild supraglottic edema but glottic aperture is patent and visible.  Pooling of secretions in piriforms.     Studies Reviewed:CT neck with contrast FINDINGS: Pharynx and larynx: Extending medial from the vicinity of right molar extraction sites is a cleft like collection traversing the right parapharyngeal space and medial masticator space, 3.3 cm in length by 12 mm in thickness on axial images. There is poorly defined  tracking of low-density inferiorly as well. Extensive regional  inflammation with retropharyngeal effusion or early abscess measuring up to 9 mm in thickness. Preferential right pharyngeal submucosal edema continuing into the right more than left supraglottic larynx and right glottis.   Salivary glands: No primary inflammation.   Thyroid: Unremarkable   Lymph nodes: Reactive thickening of cervical lymph nodes asymmetric to the right. No nodal cavitation.   Vascular: No acute finding.  No venous thrombosis.   Limited intracranial: Negative   Visualized orbits: Negative   Mastoids and visualized paranasal sinuses: Essentially clear   Skeleton: Empty wisdom tooth sockets superiorly and inferiorly.   Upper chest: Clear apical lungs.  No mediastinitis detected.   Prelim sent in epic chat, the patient is in the emergency room currently.   IMPRESSION: Extensive soft tissue infection likely arising from 1 of the right sided molar extraction sites with right parapharyngeal and masticator space abscess. Submucosal edema is pronounced and involves the right more than left pharynx and larynx, including the right vocal fold. Retropharyngeal effusion versus abscess measuring 9 mm in thickness.      Assessment/Plan: -Recent right molar extraction upper and lower with odontogenic source infection following extraction 5 days ago -Evidence of pharyngeal masticator space collection and smaller retropharyngeal collection on CT scan today -Evidence of upper airway narrowing on flexible laryngoscopy at bedside today -Receiving Unasyn and got 10 mg of Decadron on arrival  Impression: Odontogenic source infection with pharyngeal and retropharyngeal collection/upper airway narrowing  -Broad-spectrum antibiotics and steroids -Please keep n.p.o. -Case added to OR for transoral drainage and possible tracheostomy -I discussed risks and benefits of incision and drainage and possible tracheostomy with the patient, informed consent signed -Anticipate admission  for medical management following procedure, please consult medicine for admission and medical management of odontogenic source head neck infection-Will likely require ICU admission following OR today - please consult ID for antibiotic management and recommendations  Thank you for allowing me to participate in the care of this patient. Please do not hesitate to contact me with any questions or concerns.   Ashok Croon, MD Otolaryngology [facility] Phone: [phone] Fax: [phone]    02/06/2023, 9:33 AM

## 2023-02-06 NOTE — ED Notes (Signed)
Pt states she has difficulty swallowing and drooling. Pt went to bed at 2300 fine and woke up with swelling and having a hard time to breathe. Pt says she started having chills last night.

## 2023-02-06 NOTE — Op Note (Signed)
Operative Report   Preop diagnosis: parapharyngeal abscess odontogenic source and upper airway narrowing   Postop diagnosis: same  Procedure:  Tracheostomy Transoral Incision and Drainage of the parapharyngeal masticator space abscess.   Surgeon: Ashok Croon MD  Assist:None  Anesth: General endotracheal anesthesia   Complications: None  Findings: swelling and bulging along the right pharyngeal/parapharyngeal space, purulent drainage expressed after right lower molar extraction site was reopened, sent for culture  Description:  After discussing risks, benefits, and alternatives, the patient was brought to the operative suite and placed on the operative table in the supine position.  Anesthesia was induced with intubation using oral approach with Glidescope and endotracheal anesthesia was induced.  The anterior neck was prepped and draped in sterile fashion.  The trach site was injected with local anesthetic.  The incision was made using a 15 blade scalpel.  Subcutaneous fat was removed.  The strap muscles were retracted laterally exposing the thyroid isthmus.  The isthmus was divided using electocautery and ligated.  Retraction of the isthmus to either side allowed exposure of the anterior tracheal wall.  Soft tissues were swept from the trachea.  The space between the second and third tracheal rings was injected with local anesthetic.  A horizontal incision was made in this space with the scalpel and extended to either side with scissors. A 6-0 Flexible Shiley cuffed trach was placed and the inner cannula was inserted.  The anesthesia circuit was attached and the patient was successfully ventilated with return of end-tidal CO2. The trach flange was secured to the neck skin using 2-0 Prolene in four quadrants.  The patient was cleaned off and a trach dressing and trach tie were added.     We then turned our attention to the pharyngeal/masticator space collection. A Crow-Davis retractor was  placed in the mouth and suspended. There was evidence of edema/swelling along the uvula and soft palate, and bulging along the right pharyngeal wall behind the tonsil.  There is also a granulation tissue in the area of right lower molar extraction.  We proceeded to open up the site of mucosa over the right lower molar extraction with a blunt dissection and immediately after opening it there was evidence of purulent drainage from the opening.  We cultured the drainage and sent it to the lab.  Next we used an 18-gauge needle and a large syringe to attempt and aspirate any residual collection from the peritonsillar space and posterior pharyngeal space.  We additionally milked the area of swelling along the right pharyngeal wall and more purulent drainage was expressed. Approximately 3 ml of purulent drainage was expressed.  We then made a small incision and opened peritonsillar space on the right, and there was no evidence of additional purulent contents in that space.  At the end of this step we irrigated the site of incision and drainage and suctioned residual secretions and blood from the oropharynx.  There was evidence of good hemostasis.  The patient was then turned over to anesthesia and moved to the intensive care unit in stable condition.  All counts were correct.

## 2023-02-06 NOTE — ED Triage Notes (Signed)
Patient reports SOB this morning , wisdom teeth extraction 4 days ago .

## 2023-02-06 NOTE — Anesthesia Preprocedure Evaluation (Addendum)
Anesthesia Evaluation  Patient identified by MRN, date of birth, ID band Patient awake    Reviewed: Allergy & Precautions, H&P , NPO status , Patient's Chart, lab work & pertinent test results  Airway Mallampati: III   Neck ROM: limited  Mouth opening: Limited Mouth Opening  Dental   Pulmonary shortness of breath, asthma , Current Smoker and Patient abstained from smoking. Oropharangeal abscess right side.  S/p molar extraction.  Edema extends to the glottis.   breath sounds clear to auscultation       Cardiovascular negative cardio ROS  Rhythm:regular Rate:Normal     Neuro/Psych  PSYCHIATRIC DISORDERS Anxiety Depression Bipolar Disorder      GI/Hepatic   Endo/Other  diabetes    Renal/GU      Musculoskeletal   Abdominal   Peds  Hematology   Anesthesia Other Findings   Reproductive/Obstetrics                              Anesthesia Physical Anesthesia Plan  ASA: 2 and emergent  Anesthesia Plan: General   Post-op Pain Management:    Induction: Intravenous  PONV Risk Score and Plan: 2 and Ondansetron, Dexamethasone, Midazolam and Treatment may vary due to age or medical condition  Airway Management Planned: Oral ETT, Video Laryngoscope Planned and Fiberoptic Intubation Planned  Additional Equipment:   Intra-op Plan:   Post-operative Plan: Extubation in OR  Informed Consent: I have reviewed the patients History and Physical, chart, labs and discussed the procedure including the risks, benefits and alternatives for the proposed anesthesia with the patient or authorized representative who has indicated his/her understanding and acceptance.     Dental advisory given  Plan Discussed with: CRNA, Anesthesiologist and Surgeon  Anesthesia Plan Comments: (Awake intubation planned and discussed with patient.)        Anesthesia Quick Evaluation

## 2023-02-06 NOTE — ED Notes (Signed)
RN notified EDP for pain medication. Pt states 7/10 pain

## 2023-02-06 NOTE — ED Provider Notes (Addendum)
[name] Provider Note   CSN: [id number] Arrival date & time: 02/06/23  5643     History  Chief Complaint  Patient presents with   Shortness of Breath    Cheryl Tran is a 32 y.o. female history of bipolar 1, PCOS status post recent tooth extraction 4 days ago presented with right facial swelling that began at midnight.  Patient states she was at work last night and when she went to bed at midnight she began noticing facial swelling and felt warm.  Patient states that she woke up earlier this morning and she can breathe lying down.  Patient states is hard to swallow and that she is spitting up and denies any nausea vomiting or chest pain.  Patient that she does feel short of breath with lying down but is better upright.  Patient states she was able to taking Tylenol but that required great difficulty.  Patient stated she felt warm however states she did not take a temperature.  Patient does endorse right-sided neck swelling as well.  Patient denies otalgia, cough, spitting up of purulent material  Home Medications Prior to Admission medications   Medication Sig Start Date End Date Taking? Authorizing Provider  acetaminophen (TYLENOL) 325 MG tablet     [provider]  albuterol (PROVENTIL) (2.5 MG/3ML) 0.083% nebulizer solution Take 3 mLs (2.5 mg total) by nebulization every 6 (six) hours as needed for wheezing or shortness of breath. 04/29/22   Dulce Sellar, NP  albuterol (VENTOLIN HFA) 108 (90 Base) MCG/ACT inhaler Inhale 2 puffs into the lungs every 6 (six) hours as needed for wheezing or shortness of breath. 04/29/22   Dulce Sellar, NP  ARIPiprazole (ABILIFY) 5 MG tablet Take 1 tablet (5 mg total) by mouth daily in the morning. Increase to 2 tablets (10 mg total) daily after 5 days. 08/13/22     aspirin EC 325 MG tablet Take 1 tablet (325 mg total) by mouth daily. 08/19/22   Huel Cote, MD  clomiPHENE (CLOMID) 50 MG  tablet Take 1 tablet (50 mg total) by mouth daily. Take cycle days 3-7 08/11/22   Olivia Mackie, NP  fluticasone (FLOVENT HFA) 44 MCG/ACT inhaler     [provider]  gabapentin (NEURONTIN) 300 MG capsule Take 1 capsule (300 mg total) by mouth 3 (three) times daily. Patient not taking: Reported on 08/11/2022 07/08/22   Shanna Cisco, NP  metFORMIN (GLUCOPHAGE) 500 MG tablet Take 1 tablet (500 mg total) by mouth 2 (two) times daily with a meal. 04/24/22   Olivia Mackie, NP  Oxcarbazepine (TRILEPTAL) 300 MG tablet Take 1 tablet (300 mg total) by mouth 2 (two) times daily. 08/13/22     oxyCODONE (OXY IR/ROXICODONE) 5 MG immediate release tablet Take 1 tablet (5 mg total) by mouth every 4 (four) hours as needed (severe pain). 09/03/22   Huel Cote, MD  oxyCODONE (ROXICODONE) 5 MG immediate release tablet Take 1 tablet (5 mg total) by mouth every 4 (four) hours as needed for severe pain or breakthrough pain. 08/19/22   Huel Cote, MD  prazosin (MINIPRESS) 2 MG capsule Take 1 capsule (2 mg total) by mouth at bedtime. 08/13/22     SYMBICORT 80-4.5 MCG/ACT inhaler Inhale 2 puffs into the lungs 2 (two) times daily. 04/29/22   Dulce Sellar, NP      Allergies    Patient has no known allergies.    Review of Systems   Review of Systems  Respiratory:  Positive for shortness of breath.     Physical Exam Updated Vital Signs BP (!) 143/88   Pulse 97   Temp 98.1 F (36.7 C) (Oral)   Resp 20   SpO2 100%  Physical Exam Constitutional:      General: She is in acute distress.  HENT:     Mouth/Throat:     Tonsils: No tonsillar exudate.     Comments: Right-sided edema with erythema however no purulent discharge Teeth intact No elevated oral floor Spitting up saliva however still able to swallow however with great difficulty Unable to to palpate abscess Eyes:     Extraocular Movements: Extraocular movements intact.     Pupils: Pupils are equal, round, and reactive to  light.  Neck:     Comments: Right-sided neck swelling however able to fully range neck Cardiovascular:     Rate and Rhythm: Normal rate and regular rhythm.  Pulmonary:     Effort: Respiratory distress present.     Breath sounds: Normal breath sounds.     Comments: Muffled voice Lungs clear to auscultation bilaterally Abdominal:     Palpations: Abdomen is soft.     Tenderness: There is no abdominal tenderness. There is no guarding or rebound.  Neurological:     Mental Status: She is alert.     ED Results / Procedures / Treatments   Labs (all labs ordered are listed, but only abnormal results are displayed) Labs Reviewed  CBC WITH DIFFERENTIAL/PLATELET - Abnormal; Notable for the following components:      Result Value   WBC 17.2 (*)    Neutro Abs 14.6 (*)    Monocytes Absolute 1.1 (*)    Abs Immature Granulocytes 0.10 (*)    All other components within normal limits  COMPREHENSIVE METABOLIC PANEL - Abnormal; Notable for the following components:   Potassium 3.4 (*)    Glucose, Bld 117 (*)    BUN 5 (*)    Albumin 3.4 (*)    All other components within normal limits  HCG, SERUM, QUALITATIVE    EKG EKG Interpretation Date/Time:  Saturday February 06 2023 03:49:27 EDT Ventricular Rate:  98 PR Interval:  180 QRS Duration:  86 QT Interval:  324 QTC Calculation: 413 R Axis:   30  Text Interpretation: Normal sinus rhythm Normal ECG When compared with ECG of 22-Jul-2022 15:22, PREVIOUS ECG IS PRESENT Confirmed by Alvester Chou [id number]) on 02/06/2023 7:02:20 AM  Radiology DG Chest 2 View  Result Date: 02/06/2023 CLINICAL DATA:  Shortness of breath. Recent wisdom tooth extraction. EXAM: CHEST - 2 VIEW COMPARISON:  None Available. FINDINGS: Normal heart size and mediastinal contours. No acute infiltrate or edema. No effusion or pneumothorax. No acute osseous findings. Tiny cervical ribs. IMPRESSION: No active cardiopulmonary disease. Electronically Signed   By: Tiburcio Pea M.D.   On: 02/06/2023 04:58    Procedures .Critical Care  Performed by: Netta Corrigan, PA-C Authorized by: Netta Corrigan, PA-C   Critical care provider statement:    Critical care time (minutes):  50   Critical care time was exclusive of:  Separately billable procedures and treating other patients   Critical care was necessary to treat or prevent imminent or life-threatening deterioration of the following conditions: Retropharyngeal abscess.   Critical care was time spent personally by me on the following activities:  Blood draw for specimens, development of treatment plan with patient or surrogate, discussions with consultants, evaluation of patient's response to treatment, examination of patient,  obtaining history from patient or surrogate, review of old charts, re-evaluation of patient's condition, pulse oximetry, ordering and review of radiographic studies, ordering and review of laboratory studies and ordering and performing treatments and interventions   I assumed direction of critical care for this patient from another provider in my specialty: no     Care discussed with: admitting provider       Medications Ordered in ED Medications  albuterol (PROVENTIL) (2.5 MG/3ML) 0.083% nebulizer solution 2.5 mg (2.5 mg Nebulization Given 02/06/23 0348)  morphine (PF) 4 MG/ML injection 4 mg (4 mg Intravenous Given 02/06/23 0728)    ED Course/ Medical Decision Making/ A&P Clinical Course as of 02/06/23 0941  Sat Feb 06, 2023  0740 Patient evaluated near the start of my morning shift at 0700  32 yo female presenting to ED with swelling in throat, difficulty swallowing.  Reports wisdom tooth [facility] 4 [facility] [place].  Yesterday began having sore throat, woke up this morning with difficulty breathing in bed, difficulty swallowing. On exam she has muffled voice.  Mallempati score of 4, difficult to visualize posterior oropharynx.  No elevation of  the tongue, no crepitus tracking along the neck.  Concern for post-operative infection/abscess.  WBC elevated.  Plan for IV pain medications, IV steroids for swelling, IV unasyn, and stat CT of the neck soft tissue.  At this time airway is intact and not requiring intubation, no hypoxia, but will be closely monitored [MT]  0743 Activated code medical to expedite CT imaging [MT]  0847 I spoke to Dr. Irene Pap from ENT who is coming to ED for bedside assessment, pt remains stable on room air.  Airway cart at bedside per ENT request and ambu-scope [MT]  0901 Dr Irene Pap at bedside [MT]  [phone] ENT surgeon recommending OR, medical admit - likely requiring ICU monitoring of airway.  Also wanting BS antibiotics, which have been broadened now [MT]    Clinical Course User Index [MT] Trifan, Kermit Balo, MD                                 Medical Decision Making Amount and/or Complexity of Data Reviewed Labs: ordered. Radiology: ordered.  Risk Prescription drug management. Decision regarding hospitalization.   Virjean Rwanda 32 y.o. presented today for right-sided facial swelling shortness of breath. Working DDx that I considered at this time includes, but not limited to, dental abscess, oropharynx abscess, deep space abscess, airway compromise, sepsis, Ludwig's angina, RPA.  R/o DDx: dental abscess, sepsis, Ludwig's angina: These are considered less likely due to history of present illness, physical exam, labs/imaging findings  Review of prior external notes: 12/08/2022 patient message  Unique Tests and My Interpretation:  CBC: Leukocytosis 17.2 CMP: Unremarkable hCG serum qualitative: Negative Chest x-ray: No acute changes EKG: Sinus 90 bpm, no ST elevations or depressions noted, no blocks noted CT soft tissue neck with contrast: Abscess in right pharyngeal and masticator space with swelling up on the right vocal cord along with retropharyngeal abscess  Discussion with Independent Historian:  None  Discussion of Management of Tests:  Irene Pap, MD ENT; Delorise Shiner, Critical Care  Risk: High: hospitalization or escalation of hospital-level care  Risk Stratification Score: None  Staffed with Trifan, MD  Plan: On exam patient was in acute distress with stable vitals when I evaluated her.  Patient was actively spitting up saliva but was still able to swallow but  with great difficulty.  Patient does have noted right-sided facial and neck swelling but lungs are clear to auscultation.  Patient was noted to be in less distress when seated upright.  Due to patient's recent history of losing tooth extraction and physical exam concern for possible abscess that may have formed and so we will start patient IV antibiotics and scan to further assess.  Patient given morphine for pain.  On recheck patient states he feels better with the pain meds however states is still hard to swallow and is concerned about her breathing.  Radiology did contact me and then put in the read saying that patient does have extensive abscesses that extend from right Pharyngeal and masticator space and also may have retropharyngeal effusion versus abscess that is 9 mm in thickness.  Patient may also have edema of the left pharynx and larynx with involvement of the right vocal cord which would explain patient's cough and voice as well.  Will contact ENT as this will most likely need to be drained.  Anticipate admission.  ENT consulted and they will come down to evaluate patient.  Ambu scope was placed at bedside as per ENTs request.  ENT did scope patient in the ED and determined patient will need to go to the OR.  Patient will need to be admitted to the ICU as patient is high risk for airway closure and may need trach postsurgery.  ICU was consulted for admission.  10:22 AM still waiting ICU callback and secondary was notified to repaged them as patient is outside of the ER this point.  10:36 AM I spoke to the ICU and they will  be waiting for the patient as she leaves surgery.  This chart was dictated using voice recognition software.  Despite best efforts to proofread,  errors can occur which can change the documentation meaning.        Final Clinical Impression(s) / ED Diagnoses Final diagnoses:  None    Rx / DC Orders ED Discharge Orders     None         Remi Deter 02/06/23 1036    Netta Corrigan, PA-C 02/06/23 1231    Terald Sleeper, MD 02/06/23 1558

## 2023-02-06 NOTE — ED Notes (Signed)
ENT provider at bedside.  

## 2023-02-06 NOTE — Progress Notes (Signed)
ED Pharmacy Antibiotic Sign Off An antibiotic consult was received from an ED provider for vancomycin and zosyn per pharmacy dosing for pharyngeal abscess. A chart review was completed to assess appropriateness.   The following one time order(s) were placed:  Vancomycin 1750mg  x1  Zosyn 3.375g x 1   Further antibiotic and/or antibiotic pharmacy consults should be ordered by the admitting provider if indicated.   Thank you for allowing pharmacy to be a part of this patient's care.   Estill Batten, PharmD, BCCCP  Clinical Pharmacist 02/06/23 9:48 AM

## 2023-02-06 NOTE — ED Notes (Signed)
EDP at bedside  

## 2023-02-07 ENCOUNTER — Telehealth: Payer: Self-pay | Admitting: Student in an Organized Health Care Education/Training Program

## 2023-02-07 DIAGNOSIS — J390 Retropharyngeal and parapharyngeal abscess: Secondary | ICD-10-CM

## 2023-02-07 DIAGNOSIS — Z930 Tracheostomy status: Secondary | ICD-10-CM | POA: Diagnosis not present

## 2023-02-07 HISTORY — DX: Retropharyngeal and parapharyngeal abscess: J39.0

## 2023-02-07 LAB — CBC WITH DIFFERENTIAL/PLATELET
Abs Immature Granulocytes: 0.09 10*3/uL — ABNORMAL HIGH (ref 0.00–0.07)
Basophils Absolute: 0 10*3/uL (ref 0.0–0.1)
Basophils Relative: 0 %
Eosinophils Absolute: 0 10*3/uL (ref 0.0–0.5)
Eosinophils Relative: 0 %
HCT: 42.1 % (ref 36.0–46.0)
Hemoglobin: 13.6 g/dL (ref 12.0–15.0)
Immature Granulocytes: 1 %
Lymphocytes Relative: 4 %
Lymphs Abs: 0.7 10*3/uL (ref 0.7–4.0)
MCH: 27.3 pg (ref 26.0–34.0)
MCHC: 32.3 g/dL (ref 30.0–36.0)
MCV: 84.5 fL (ref 80.0–100.0)
Monocytes Absolute: 0.3 10*3/uL (ref 0.1–1.0)
Monocytes Relative: 2 %
Neutro Abs: 17.8 10*3/uL — ABNORMAL HIGH (ref 1.7–7.7)
Neutrophils Relative %: 93 %
Platelets: 364 10*3/uL (ref 150–400)
RBC: 4.98 MIL/uL (ref 3.87–5.11)
RDW: 13.5 % (ref 11.5–15.5)
WBC: 18.9 10*3/uL — ABNORMAL HIGH (ref 4.0–10.5)
nRBC: 0 % (ref 0.0–0.2)

## 2023-02-07 LAB — BASIC METABOLIC PANEL
Anion gap: 15 (ref 5–15)
BUN: 12 mg/dL (ref 6–20)
CO2: 20 mmol/L — ABNORMAL LOW (ref 22–32)
Calcium: 9 mg/dL (ref 8.9–10.3)
Chloride: 106 mmol/L (ref 98–111)
Creatinine, Ser: 0.82 mg/dL (ref 0.44–1.00)
GFR, Estimated: >60 mL/min (ref >60)
Glucose, Bld: 96 mg/dL (ref 70–99)
Potassium: 3.7 mmol/L (ref 3.5–5.1)
Sodium: 141 mmol/L (ref 135–145)

## 2023-02-07 LAB — GLUCOSE, CAPILLARY
Glucose-Capillary: 109 mg/dL — ABNORMAL HIGH (ref 70–99)
Glucose-Capillary: 108 mg/dL — ABNORMAL HIGH (ref 70–99)
Glucose-Capillary: 116 mg/dL — ABNORMAL HIGH (ref 70–99)

## 2023-02-07 MED ORDER — ALBUTEROL SULFATE (2.5 MG/3ML) 0.083% IN NEBU
2.5000 mg | INHALATION_SOLUTION | Freq: Four times a day (QID) | RESPIRATORY_TRACT | Status: DC
  Administered 2023-02-07: 2.5 mg via RESPIRATORY_TRACT
  Filled 2023-02-07: qty 3

## 2023-02-07 MED ORDER — AMOXICILLIN-POT CLAVULANATE 875-125 MG PO TABS: 1.0000 | ORAL_TABLET | Freq: Two times a day (BID) | ORAL | 0 refills | Status: DC

## 2023-02-07 MED ORDER — HEPARIN SODIUM (PORCINE) 5000 UNIT/ML IJ SOLN
5000.0000 [IU] | Freq: Three times a day (TID) | INTRAMUSCULAR | Status: DC
  Administered 2023-02-07: 5000 [IU] via SUBCUTANEOUS
  Filled 2023-02-07: qty 1

## 2023-02-07 NOTE — Progress Notes (Signed)
[name], MRN:  [id number], DOB:  20-Jun-1990, LOS: 1 ADMISSION DATE:  02/06/2023, CHIEF COMPLAINT:  Respiratory Failure   History of Present Illness:   32yo F PMH PCOS, bipolar 1, PTSD, smoking, who had wisdom teeth removed 10/15 and started to have swelling, painful swallowing, drooling, phonation changes and difficulties breathing since 10/18 amd presented to ED 10/19 with worse SOB.  Sent for STAT CT soft tissue neck which revealed extensive soft tissue infection with R pharyngeal and retropharyngeal abscess. Received unasyn x1 and 10mg  decadron. ENT laryngoscopy in ED also significant for airway narrowing, R vocal cord is sluggish, pooling of secretions. Taken to OR urgently for abscess drainage and trach     Pertinent  Medical History   PCOS Bipolar 1 PTSD Smoking Asthma  Wisdom teeth extraction   Significant Hospital Events: Including procedures, antibiotic start and stop dates in addition to other pertinent events   10/19 OR with ENT for trach 10/20 cuff deflated, vocalizing finger occlusion test  Interim History / Subjective:  Denies pain, able to vocalize  Objective   Blood pressure 119/87, pulse (!) 59, temperature 98.1 F (36.7 C), temperature source Oral, resp. rate 10, height 5\' 8"  (1.727 m), weight 93.3 kg, last menstrual period 01/28/2023, SpO2 100%.    FiO2 (%):  [28 %-35 %] 28 %   Intake/Output Summary (Last 24 hours) at 02/07/2023 0753 Last data filed at 02/07/2023 0700 Gross per 24 hour  Intake 1400.09 ml  Output 6 ml  Net 1394.09 ml   Filed Weights   02/06/23 1018 02/06/23 1300 02/07/23 0500  Weight: 81.6 kg 96.3 kg 93.3 kg    Examination: Physical Exam Constitutional:      General: She is not in acute distress.    Appearance: She is well-developed. She is not ill-appearing.  Cardiovascular:     Pulses: Normal pulses.     Heart sounds: Normal heart sounds.  Pulmonary:     Effort: Pulmonary effort is normal.     Breath sounds: Normal  breath sounds.     Comments: Tracheostomy tube in place Abdominal:     Palpations: Abdomen is soft.  Neurological:     General: No focal deficit present.     Mental Status: She is alert and oriented to person, place, and time. Mental status is at baseline.       Assessment & Plan:   #Retropharyngeal Abscess #Upper Airway Obstruction #S/P Tracheostomy with ENT #History of Asthma #Bipolar 1  Dental source to the abscess, to the OR with ENT for I&D and is s/p Shiley 6.0 without complication. ENT deflated the cuff this AM and patient is able to verbalize with finger occlusion test. ENT plans to switch to a cuffless trach on POD #3. Continued on dexamethasone and antibiotics with guidance from ID.  -trach care in consultation with ENT -continue antibiotics -continue dexamethasone -SLP for swallow assessment  -defer PMV until a later time -continue psychotropic medications after cleared with SLP  Best Practice (right click and "Reselect all SmartList Selections" daily)   Diet/type: NPO DVT prophylaxis: prophylactic heparin  GI prophylaxis: N/A Lines: N/A Foley:  N/A Code Status:  full code Last date of multidisciplinary goals of care discussion [02/07/2023]  Labs   CBC: Recent Labs  Lab 02/06/23 0358  WBC 17.2*  NEUTROABS 14.6*  HGB 13.6  HCT 41.7  MCV 85.5  PLT 315    Basic Metabolic Panel: Recent Labs  Lab 02/06/23 0358  NA 137  K 3.4*  CL 100  CO2 22  GLUCOSE 117*  BUN 5*  CREATININE 0.82  CALCIUM 9.3   GFR: Estimated Creatinine Clearance: 116.6 mL/min (by C-G formula based on SCr of 0.82 mg/dL). Recent Labs  Lab 02/06/23 0358  WBC 17.2*    Liver Function Tests: Recent Labs  Lab 02/06/23 0358  AST 17  ALT 12  ALKPHOS 49  BILITOT 0.8  PROT 6.9  ALBUMIN 3.4*   No results for input(s): "LIPASE", "AMYLASE" in the last 168 hours. No results for input(s): "AMMONIA" in the last 168 hours.  ABG No results found for: "PHART", "PCO2ART",  "PO2ART", "HCO3", "TCO2", "ACIDBASEDEF", "O2SAT"   Coagulation Profile: Recent Labs  Lab 02/06/23 0930  INR 1.2    Cardiac Enzymes: No results for input(s): "CKTOTAL", "CKMB", "CKMBINDEX", "TROPONINI" in the last 168 hours.  HbA1C: Hgb A1c MFr Bld  Date/Time Value Ref Range Status  02/06/2023 04:44 PM 5.2 4.8 - 5.6 % Final    Comment:    (NOTE) Pre diabetes:          5.7%-6.4%  Diabetes:              >6.4%  Glycemic control for   <7.0% adults with diabetes   04/24/2022 11:30 AM 5.4 <5.7 % of total Hgb Final    Comment:    For the purpose of screening for the presence of diabetes: . <5.7%       Consistent with the absence of diabetes 5.7-6.4%    Consistent with increased risk for diabetes             (prediabetes) > or =6.5%  Consistent with diabetes . This assay result is consistent with a decreased risk of diabetes. . Currently, no consensus exists regarding use of hemoglobin A1c for diagnosis of diabetes in children. . According to American Diabetes Association (ADA) guidelines, hemoglobin A1c <7.0% represents optimal control in non-pregnant diabetic patients. Different metrics may apply to specific patient populations.  Standards of Medical Care in Diabetes(ADA). .     CBG: Recent Labs  Lab 02/06/23 1529 02/06/23 1917 02/06/23 2302 02/07/23 0311 02/07/23 0726  GLUCAP 108* 85 120* 109* 108*    Review of Systems:   Review of Systems  Constitutional:  Negative for chills and fever.  Respiratory:  Positive for cough. Negative for hemoptysis, sputum production, shortness of breath and wheezing.   Cardiovascular:  Negative for chest pain.     Past Medical History:  She,  has a past medical history of Anxiety, Asthma, Bipolar 1 disorder (HCC), and Depression.   Surgical History:   Past Surgical History:  Procedure Laterality Date   KNEE ARTHROSCOPY WITH ANTERIOR CRUCIATE LIGAMENT (ACL) REPAIR Right 08/18/2022   Procedure: RIGHT KNEE ARTHROSCOPY  WITH ANTERIOR CRUCIATE LIGAMENT (ACL) REPAIR;  Surgeon: Huel Cote, MD;  Location: [facility];  Service: Orthopedics;  Laterality: Right;   KNEE ARTHROSCOPY WITH MACI CARTILAGE HARVEST Right 06/22/2022   Procedure: RIGHT KNEE ARTHROSCOPY PATELLA CHONDROPLASTY  WITH MACI CARTILAGE HARVEST;  Surgeon: Huel Cote, MD;  Location: [facility];  Service: Orthopedics;  Laterality: Right;   OSTEOCHONDRAL DEFECT REPAIR/RECONSTRUCTION Right 08/18/2022   Procedure: OSTEOCHONDRAL DEFECT REPAIR/RECONSTRUCTION/MACI IMPLANTATION;  Surgeon: Huel Cote, MD;  Location: [facility];  Service: Orthopedics;  Laterality: Right;     Social History:   reports that she has been smoking cigarettes and cigars. She has never used smokeless tobacco. She reports current alcohol use. She reports current drug use. Drug: Marijuana.  Family History:  Her family history includes [place]'s disease in her mother; Bipolar disorder in her sister; Breast cancer in her maternal grandmother; Diabetes in her father; Luiz Blare' disease in her sister; Hypertension in her father and mother; Hyperthyroidism in her maternal grandmother; Neuropathy in her mother; Pernicious anemia in her mother.   Allergies No Known Allergies   Home Medications  Prior to Admission medications   Medication Sig Start Date End Date Taking? Authorizing Provider  acetaminophen (TYLENOL) 325 MG tablet     [provider]  albuterol (PROVENTIL) (2.5 MG/3ML) 0.083% nebulizer solution Take 3 mLs (2.5 mg total) by nebulization every 6 (six) hours as needed for wheezing or shortness of breath. 04/29/22   Dulce Sellar, NP  albuterol (VENTOLIN HFA) 108 (90 Base) MCG/ACT inhaler Inhale 2 puffs into the lungs every 6 (six) hours as needed for wheezing or shortness of breath. 04/29/22   Dulce Sellar, NP  ARIPiprazole (ABILIFY) 5 MG tablet Take 1 tablet (5 mg total) by mouth daily in the morning.  Increase to 2 tablets (10 mg total) daily after 5 days. 08/13/22     aspirin EC 325 MG tablet Take 1 tablet (325 mg total) by mouth daily. 08/19/22   Huel Cote, MD  clomiPHENE (CLOMID) 50 MG tablet Take 1 tablet (50 mg total) by mouth daily. Take cycle days 3-7 08/11/22   Olivia Mackie, NP  fluticasone (FLOVENT HFA) 44 MCG/ACT inhaler     [provider]  gabapentin (NEURONTIN) 300 MG capsule Take 1 capsule (300 mg total) by mouth 3 (three) times daily. Patient not taking: Reported on 08/11/2022 07/08/22   Shanna Cisco, NP  metFORMIN (GLUCOPHAGE) 500 MG tablet Take 1 tablet (500 mg total) by mouth 2 (two) times daily with a meal. 04/24/22   Olivia Mackie, NP  Oxcarbazepine (TRILEPTAL) 300 MG tablet Take 1 tablet (300 mg total) by mouth 2 (two) times daily. 08/13/22     oxyCODONE (OXY IR/ROXICODONE) 5 MG immediate release tablet Take 1 tablet (5 mg total) by mouth every 4 (four) hours as needed (severe pain). 09/03/22   Huel Cote, MD  oxyCODONE (ROXICODONE) 5 MG immediate release tablet Take 1 tablet (5 mg total) by mouth every 4 (four) hours as needed for severe pain or breakthrough pain. 08/19/22   Huel Cote, MD  prazosin (MINIPRESS) 2 MG capsule Take 1 capsule (2 mg total) by mouth at bedtime. 08/13/22     SYMBICORT 80-4.5 MCG/ACT inhaler Inhale 2 puffs into the lungs 2 (two) times daily. 04/29/22   Dulce Sellar, NP     Critical care time: 32 minutes     Raechel Chute, MD [facility] 02/07/2023 1:00 PM

## 2023-02-07 NOTE — Evaluation (Signed)
Clinical/Bedside Swallow Evaluation Patient Details  Name: Cheryl Tran MRN: [id number] Date of Birth: 12/11/1990  Today's Date: 02/07/2023 Time: SLP Start Time (ACUTE ONLY): 1015 SLP Stop Time (ACUTE ONLY): 1027 SLP Time Calculation (min) (ACUTE ONLY): 12 min  Past Medical History:  Past Medical History:  Diagnosis Date   Anxiety    Asthma    Bipolar 1 disorder (HCC)    Depression    Past Surgical History:  Past Surgical History:  Procedure Laterality Date   KNEE ARTHROSCOPY WITH ANTERIOR CRUCIATE LIGAMENT (ACL) REPAIR Right 08/18/2022   Procedure: RIGHT KNEE ARTHROSCOPY WITH ANTERIOR CRUCIATE LIGAMENT (ACL) REPAIR;  Surgeon: Huel Cote, MD;  Location: [facility];  Service: Orthopedics;  Laterality: Right;   KNEE ARTHROSCOPY WITH MACI CARTILAGE HARVEST Right 06/22/2022   Procedure: RIGHT KNEE ARTHROSCOPY PATELLA CHONDROPLASTY  WITH MACI CARTILAGE HARVEST;  Surgeon: Huel Cote, MD;  Location: [facility];  Service: Orthopedics;  Laterality: Right;   OSTEOCHONDRAL DEFECT REPAIR/RECONSTRUCTION Right 08/18/2022   Procedure: OSTEOCHONDRAL DEFECT REPAIR/RECONSTRUCTION/MACI IMPLANTATION;  Surgeon: Huel Cote, MD;  Location: [facility];  Service: Orthopedics;  Laterality: Right;   HPI:  Cheryl Tran is a 32yo F who had wisdom teeth removed 10/15 and started to have swelling, painful swallowing, drooling, phonation changes and difficulties breathing since 10/18 amd presented to ED 10/19 with worse SOB. Sent for STAT CT soft tissue neck which revealed extensive soft tissue infection with R pharyngeal and retropharyngeal abscess. ENT laryngoscopy in ED also significant for airway narrowing, R vocal cord is sluggish, pooling of secretions.  Taken to OR urgently for abscess drainage and trach. Shiley 6 cuffed placed 10/19.  Pt with PMH PCOS, bipolar 1, PTSD, smoking,    Assessment / Plan / Recommendation  Clinical Impression  Pt presents with  adequate swallow function as assessed clinicaly in setting of new tracheostomy.  Pt with shiley 6 tracheostomy with cuff deflated.  Pt using finger occlusion on SLP arrival, able to acheive leak speech with very strong phonation.  ENT requrests hold on PMV at this time per attending. This evaluation was completed without valve in place.  Pt is managing secretions independently.  She reports improvement in swallowing since arrival.  Pt with no clinical s/s of aspiration with any consistencies trialed, including serial straw sips of thin liquid with open tracheostomy.  Pt exhibited good oral clearance of solids and purees.  She coughed following completion of assessment, held cloth to trach hub, and checked for expulsion of cracker residue herself, but found expetorated material to be clear. Pt appears safe to resume a regular diet.  Recommend choosing softer foods to her comfort given swelling. Pt may benefit from PMV eval when medically ready.     Recommend regular texture diet with thin liquids.   SLP Visit Diagnosis: Dysphagia, unspecified (R13.10)    Aspiration Risk  Mild aspiration risk    Diet Recommendation Regular;Thin liquid    Liquid Administration via: Cup;Straw Medication Administration: Whole meds with liquid Supervision: Patient able to self feed Compensations: Small sips/bites;Slow rate Postural Changes: Seated upright at 90 degrees    Other  Recommendations Oral Care Recommendations: Oral care BID    Recommendations for follow up therapy are one component of a multi-disciplinary discharge planning process, led by the attending physician.  Recommendations may be updated based on patient status, additional functional criteria and insurance authorization.  Follow up Recommendations Outpatient SLP (At this time recommend SLP at next level of care, pending resolution of symptoms/decannulation)  Assistance Recommended at Discharge  N/A  Functional Status Assessment Patient has  had a recent decline in their functional status and demonstrates the ability to make significant improvements in function in a reasonable and predictable amount of time.  Frequency and Duration min 2x/week  2 weeks       Prognosis Prognosis for improved oropharyngeal function: Good      Swallow Study   General Date of Onset: 02/06/23 HPI: Cheryl Tran is a 32yo F who had wisdom teeth removed 10/15 and started to have swelling, painful swallowing, drooling, phonation changes and difficulties breathing since 10/18 amd presented to ED 10/19 with worse SOB. Sent for STAT CT soft tissue neck which revealed extensive soft tissue infection with R pharyngeal and retropharyngeal abscess. ENT laryngoscopy in ED also significant for airway narrowing, R vocal cord is sluggish, pooling of secretions.  Taken to OR urgently for abscess drainage and trach. Shiley 6 cuffed placed 10/19.  Pt with PMH PCOS, bipolar 1, PTSD, smoking, Type of Study: Bedside Swallow Evaluation Previous Swallow Assessment: none Diet Prior to this Study: NPO Temperature Spikes Noted: No Respiratory Status: Trach Collar Behavior/Cognition: Alert;Cooperative;Pleasant mood Oral Cavity Assessment: Edema;Erythema (R oropharynx) Oral Care Completed by SLP: No Oral Cavity - Dentition: Adequate natural dentition Vision: Functional for self-feeding Self-Feeding Abilities: Able to feed self Patient Positioning: Upright in bed Baseline Vocal Quality: Normal Volitional Cough: Strong Volitional Swallow: Able to elicit    Oral/Motor/Sensory Function Overall Oral Motor/Sensory Function: Mild impairment Facial ROM: Within Functional Limits Facial Symmetry: Within Functional Limits Lingual ROM: Within Functional Limits Lingual Symmetry: Abnormal symmetry right (Slight R protrusion, suspect 2/2 to R pharyngeal swelling) Lingual Strength: Within Functional Limits Velum:  (reduced elevation 2/2 edema) Mandible: Within Functional Limits   Ice  Chips Ice chips: Not tested   Thin Liquid Thin Liquid: Within functional limits Presentation: Straw    Nectar Thick Nectar Thick Liquid: Not tested   Honey Thick Honey Thick Liquid: Not tested   Puree Puree: Within functional limits Presentation: Spoon   Solid     Solid: Within functional limits Presentation: Spoon      Kerrie Pleasure, MA, CCC-SLP Acute Rehabilitation Services Office: [phone] 02/07/2023,10:43 AM

## 2023-02-07 NOTE — Progress Notes (Signed)
Pt returned for the rest of her belongings. Charger, jewelry and blanket were given back.

## 2023-02-07 NOTE — Telephone Encounter (Signed)
Brief Phone Note:  Patient left against medical advice from our ICU earlier today. She was on Unasyn for her retropharyngeal abscess. I discussed with Dr. Irene Pap who recommended we send a prescription for Augmentin for 2 weeks. I called the patient and let her know, she will pick up the prescription from the pharmacy. Informed the patient she should come back to the hospital to the ED with any change in symptoms or worsening.  Raechel Chute, MD [facility] 02/07/2023 1:17 PM

## 2023-02-07 NOTE — Progress Notes (Signed)
Brief Progress Note:  Patient and her partner were noted to be arguing in the room. Provider and Nurse attempted to de-escalate situation, unable to do so. Patient requested to leave AMA. Counseled on the importance to stay, she was adamant. She signed paperworks and left.   Raechel Chute, MD [facility] 02/07/2023 1:02 PM

## 2023-02-07 NOTE — Progress Notes (Addendum)
[facility]                                                                                        Patient Identification: Patient Name: Cheryl Tran MRN: [id number] Admit Date: 02/06/2023  3:41 AM Today's Date: 02/07/2023 Reason for consult: Retropharyngeal abscess Requesting provider: Lynnell Catalan  Principal Problem:   Critical airway Active Problems:   Retropharyngeal abscess   Tracheostomy in place [facility])   Antibiotics:  Unasyn 10/19-c  Lines/Hardware:  Assessment # Right parapharyngeal, masticator space abscess, simple retropharyngeal abscess: possible odontogenic source  10/19 s/p tracheostomy, transoral I&D of the parapharyngeal masticator space abscess, OR cx with Group F streptococcus ( GPC and GNR in gram stain) OR findings: swelling and bulging along the right pharyngeal/parapharyngeal space, purulent drainage expressed after right lower molar extraction site was reopened, sent for culture   Recommendations  - Continue Unasyn. No MRSA risk factors - Fu blood and OR cultures  - Orthopantogram when able  - Monitor CBC and BMP on abtx - Post op care per ENT  - d/w CCM Dr Thedore Mins to follow starting 10/21   Rest of the management as per the primary team. Please call with questions or concerns.  Thank you for the consult  __________________________________________________________________________________________________________ HPI and Hospital Course: 32 year old female with PMH of bipolar 1 disorder/depression, anxiety/asthma who presented to the ED on 10/19 with throat swelling and difficulty swallowing.  She had wisdom tooth extracted 4 days ago.  She started having sore throat and had difficulty breathing as well as difficulty swallowing day of presentation.  At ED, afebrile she had muffled voice, difficulty visualize posterior oropharynx.  ENT was consulted Labs remarkable  for K3.4 WBC 17.2 10/19 blood culture no growth to date Imaging as below 10/19 s/p tracheostomy, transoral I&D of the parapharyngeal masticator space abscess, or cultures pending OR findings: swelling and bulging along the right pharyngeal/parapharyngeal space, purulent drainage expressed after right lower molar extraction site was reopened, sent for culture  02/06/23 MRSA PCR negative   ROS: unable to obtain as patient cannot talk   Past Medical History:  Diagnosis Date   Anxiety    Asthma    Bipolar 1 disorder (HCC)    Depression    Past Surgical History:  Procedure Laterality Date   KNEE ARTHROSCOPY WITH ANTERIOR CRUCIATE LIGAMENT (ACL) REPAIR Right 08/18/2022   Procedure: RIGHT KNEE ARTHROSCOPY WITH ANTERIOR CRUCIATE LIGAMENT (ACL) REPAIR;  Surgeon: Huel Cote, MD;  Location: [facility];  Service: Orthopedics;  Laterality: Right;   KNEE ARTHROSCOPY WITH MACI CARTILAGE HARVEST Right 06/22/2022   Procedure: RIGHT KNEE ARTHROSCOPY PATELLA CHONDROPLASTY  WITH MACI CARTILAGE HARVEST;  Surgeon: Huel Cote, MD;  Location: [facility];  Service: Orthopedics;  Laterality: Right;   OSTEOCHONDRAL DEFECT REPAIR/RECONSTRUCTION Right 08/18/2022   Procedure: OSTEOCHONDRAL DEFECT REPAIR/RECONSTRUCTION/MACI IMPLANTATION;  Surgeon: Huel Cote, MD;  Location:  [facility];  Service: Orthopedics;  Laterality: Right;     Scheduled Meds:  albuterol  2.5 mg Nebulization Q6H   Chlorhexidine Gluconate Cloth  6 each Topical Daily   dexamethasone (DECADRON) injection  10 mg Intravenous  Q24H   insulin aspart  2-6 Units Subcutaneous Q4H   mouth rinse  15 mL Mouth Rinse 4 times per day   Continuous Infusions:  acetaminophen 1,000 mg (02/07/23 0601)   ampicillin-sulbactam (UNASYN) IV Stopped (02/07/23 0232)   PRN Meds:.albuterol, fentaNYL (SUBLIMAZE) injection, mouth rinse  No Known Allergies  Social History   Socioeconomic History   Marital  status: Married    Spouse name: Not on file   Number of children: Not on file   Years of education: Not on file   Highest education level: Not on file  Occupational History   Not on file  Tobacco Use   Smoking status: Every Day    Types: Cigarettes, Cigars   Smokeless tobacco: Never  Vaping Use   Vaping status: Never Used  Substance and Sexual Activity   Alcohol use: Yes    Comment: socially   Drug use: Yes    Types: Marijuana    Comment: 02/05/23   Sexual activity: Yes    Partners: Male    Birth control/protection: None    Comment: First IC <16 y/o, Hx of TV+  Other Topics Concern   Not on file  Social History Narrative   Not on file   Social Determinants of Health   Financial Resource Strain: Medium Risk (05/12/2022)   Overall Financial Resource Strain (CARDIA)    Difficulty of Paying Living Expenses: Somewhat hard  Food Insecurity: No Food Insecurity (02/06/2023)   Hunger Vital Sign    Worried About Running Out of Food in the Last Year: Never true    Ran Out of Food in the Last Year: Never true  Transportation Needs: No Transportation Needs (02/06/2023)   PRAPARE - Administrator, Civil Service (Medical): No    Lack of Transportation (Non-Medical): No  Physical Activity: Inactive (05/12/2022)   Exercise Vital Sign    Days of Exercise per Week: 0 days    Minutes of Exercise per Session: 0 min  Stress: Stress Concern Present (05/12/2022)   [employer] of Occupational Health - Occupational Stress Questionnaire    Feeling of Stress : Very much  Social Connections: Unknown (10/06/2022)   Received from [place], [facility]bs   Social Connections    How often do you feel lonely or isolated from those around you? (Adult - for ages 68 years and over): Not on file  Intimate Partner Violence: Not At Risk (02/06/2023)   Humiliation, Afraid, Rape, and Kick questionnaire    Fear of Current or Ex-Partner: No    Emotionally Abused: No    Physically Abused: No     Sexually Abused: No    Family History  Problem Relation Age of Onset   Neuropathy Mother    Addison's disease Mother    Hypertension Mother    Pernicious anemia Mother    Hypertension Father    Diabetes Father    Graves' disease Sister    Bipolar disorder Sister    Breast cancer Maternal Grandmother    Hyperthyroidism Maternal Grandmother    Vitals BP 119/87   Pulse (!) 52   Temp 98.1 F (36.7 C) (Oral)   Resp 13   Ht 5\' 8"  (1.727 m)   Wt 93.3 kg   LMP 01/28/2023   SpO2 100%   BMI 31.27 kg/m   Physical Exam  Adult female lying in the bed and NAD, S/p tracheostomy  Limited exam as patient cannot speak and was already frustrated due to her tube that was disconnected previously  and was not in pleasant mood when I arrived.  Informed ICU attending about her concerns. Heart/lung and abdomen - wnl No pedal edema    Pertinent Microbiology Results for orders placed or performed during the hospital encounter of 02/06/23  Aerobic/Anaerobic Culture w Gram Stain (surgical/deep wound)     Status: None (Preliminary result)   Collection Time: 02/06/23 11:23 AM   Specimen: Path fluid; Body Fluid  Result Value Ref Range Status   Specimen Description ABSCESS  Final   Special Requests trans oral  Final   Gram Stain   Final    FEW WBC SEEN ABUNDANT GRAM POSITIVE COCCI MODERATE GRAM NEGATIVE RODS Performed at [facility], [address], [place], [place] [address]    Culture PENDING  Incomplete   Report Status PENDING  Incomplete  MRSA Next Gen by PCR, Nasal     Status: None   Collection Time: 02/06/23 12:58 PM   Specimen: Nasal Mucosa; Nasal Swab  Result Value Ref Range Status   MRSA by PCR Next Gen NOT DETECTED NOT DETECTED Final    Comment: (NOTE) The GeneXpert MRSA Assay (FDA approved for NASAL specimens only), is one component of a comprehensive MRSA colonization surveillance program. It is not intended to diagnose MRSA infection nor to guide or monitor treatment  for MRSA infections. Test performance is not FDA approved in patients less than 28 years old. Performed at [facility], [address] [place], [place] [address]    Pertinent Lab seen by me:    Latest Ref Rng & Units 02/06/2023    3:58 AM  CBC  WBC 4.0 - 10.5 K/uL 17.2   Hemoglobin 12.0 - 15.0 g/dL 86.5   Hematocrit 78.4 - 46.0 % 41.7   Platelets 150 - 400 K/uL 315       Latest Ref Rng & Units 02/06/2023    3:58 AM 06/19/2022    1:10 PM  CMP  Glucose 70 - 99 mg/dL 696  84   BUN 6 - 20 mg/dL 5  7   Creatinine 2.95 - 1.00 mg/dL 2.84  1.32   Sodium 440 - 145 mmol/L 137  139   Potassium 3.5 - 5.1 mmol/L 3.4  4.3   Chloride 98 - 111 mmol/L 100  106   CO2 22 - 32 mmol/L 22  23   Calcium 8.9 - 10.3 mg/dL 9.3  8.0   Total Protein 6.5 - 8.1 g/dL 6.9    Total Bilirubin 0.3 - 1.2 mg/dL 0.8    Alkaline Phos 38 - 126 U/L 49    AST 15 - 41 U/L 17    ALT 0 - 44 U/L 12       Pertinent Imagings/Other Imagings Plain films and CT images have been personally visualized and interpreted; radiology reports have been reviewed. Decision making incorporated into the Impression / Recommendations.  CT Soft Tissue Neck W Contrast  Result Date: 02/06/2023 CLINICAL DATA:  Right facial swelling and mild fold voice. Recent dental extraction. EXAM: CT NECK WITH CONTRAST TECHNIQUE: Multidetector CT imaging of the neck was performed using the standard protocol following the bolus administration of intravenous contrast. RADIATION DOSE REDUCTION: This exam was performed according to the departmental dose-optimization program which includes automated exposure control, adjustment of the mA and/or kV according to patient size and/or use of iterative reconstruction technique. CONTRAST:  75mL OMNIPAQUE IOHEXOL 350 MG/ML SOLN COMPARISON:  None Available. FINDINGS: Pharynx and larynx: Extending medial from the vicinity of right molar  extraction sites is a cleft like collection traversing the right parapharyngeal  space and medial masticator space, 3.3 cm in length by 12 mm in thickness on axial images. There is poorly defined tracking of low-density inferiorly as well. Extensive regional inflammation with retropharyngeal effusion or early abscess measuring up to 9 mm in thickness. Preferential right pharyngeal submucosal edema continuing into the right more than left supraglottic larynx and right glottis. Salivary glands: No primary inflammation. Thyroid: Unremarkable Lymph nodes: Reactive thickening of cervical lymph nodes asymmetric to the right. No nodal cavitation. Vascular: No acute finding.  No venous thrombosis. Limited intracranial: Negative Visualized orbits: Negative Mastoids and visualized paranasal sinuses: Essentially clear Skeleton: Empty wisdom tooth sockets superiorly and inferiorly. Upper chest: Clear apical lungs.  No mediastinitis detected. Prelim sent in epic chat, the patient is in the emergency room currently. IMPRESSION: Extensive soft tissue infection likely arising from 1 of the right sided molar extraction sites with right parapharyngeal and masticator space abscess. Submucosal edema is pronounced and involves the right more than left pharynx and larynx, including the right vocal fold. Retropharyngeal effusion versus abscess measuring 9 mm in thickness. Electronically Signed   By: Tiburcio Pea M.D.   On: 02/06/2023 08:33   DG Chest 2 View  Result Date: 02/06/2023 CLINICAL DATA:  Shortness of breath. Recent wisdom tooth extraction. EXAM: CHEST - 2 VIEW COMPARISON:  None Available. FINDINGS: Normal heart size and mediastinal contours. No acute infiltrate or edema. No effusion or pneumothorax. No acute osseous findings. Tiny cervical ribs. IMPRESSION: No active cardiopulmonary disease. Electronically Signed   By: Tiburcio Pea M.D.   On: 02/06/2023 04:58    I have personally spent 83 minutes involved in face-to-face and non-face-to-face activities for this patient on the day of the visit.  Professional time spent includes the following activities: Preparing to see the patient (review of tests), Obtaining and/or reviewing separately obtained history (admission/discharge record), Performing a medically appropriate examination and/or evaluation , Ordering medications/tests/procedures, referring and communicating with other health care professionals, Documenting clinical information in the EMR, Independently interpreting results (not separately reported), Communicating results to the patient/family/caregiver, Counseling and educating the patient/family/caregiver and Care coordination (not separately reported).  Electronically signed by:   Plan d/w requesting provider as well as ID pharm D  Note: This document was prepared using dragon voice recognition software and may include unintentional dictation errors.   Odette Fraction, MD Infectious Disease Physician [facility] Pager: [phone]

## 2023-02-07 NOTE — Progress Notes (Signed)
Pt alert, oriented. On trach color for humidification. VS stable. Speech eval placed order for a diet.   Commotion was heard in the room, after significant other, Ronaldo Miyamoto, returned from home with food. Vices were raised, and Ronaldo Miyamoto stated "she is lying to her family"macular degeneration and nursing staff is at the bedside. Security called as well. Pt got very agitated. Staff tried several times to diffuse the argument bw Ronaldo Miyamoto and Nandika. At some point [facility] left the room. Pt was continuously very agitated and stated she wants to leave. Emotional support was provided. MD at the bedside. Possible complications of leaving were explained, including death. Advised for any worsening symptoms to return to the hospital. Pt unable to change her mind, ripped off her IV, collected belongings, signed AMA form and left the unit. (MD and [facility] are aware) P.S.: some belongings were found after patient left (jewelry, phone charges and blanket). Placed near Diplomatic Services operational officer at this time for the return

## 2023-02-08 ENCOUNTER — Inpatient Hospital Stay (HOSPITAL_COMMUNITY)
Admission: EM | Admit: 2023-02-08 | Discharge: 2023-02-15 | DRG: 144 | Disposition: A | Discharge status: Home / Self Care | Payer: MEDICAID | Attending: Internal Medicine | Admitting: Internal Medicine

## 2023-02-08 ENCOUNTER — Emergency Department (HOSPITAL_COMMUNITY): Payer: MEDICAID

## 2023-02-08 ENCOUNTER — Encounter (HOSPITAL_COMMUNITY): Payer: Self-pay | Admitting: Internal Medicine

## 2023-02-08 ENCOUNTER — Telehealth (INDEPENDENT_AMBULATORY_CARE_PROVIDER_SITE_OTHER): Payer: Self-pay | Admitting: Physician Assistant

## 2023-02-08 ENCOUNTER — Other Ambulatory Visit: Payer: Self-pay

## 2023-02-08 DIAGNOSIS — D72829 Elevated white blood cell count, unspecified: Secondary | ICD-10-CM | POA: Diagnosis present

## 2023-02-08 DIAGNOSIS — J45909 Unspecified asthma, uncomplicated: Secondary | ICD-10-CM | POA: Diagnosis present

## 2023-02-08 DIAGNOSIS — Z9152 Personal history of nonsuicidal self-harm: Secondary | ICD-10-CM

## 2023-02-08 DIAGNOSIS — Z6831 Body mass index (BMI) 31.0-31.9, adult: Secondary | ICD-10-CM | POA: Diagnosis not present

## 2023-02-08 DIAGNOSIS — Z8249 Family history of ischemic heart disease and other diseases of the circulatory system: Secondary | ICD-10-CM | POA: Diagnosis not present

## 2023-02-08 DIAGNOSIS — Z803 Family history of malignant neoplasm of breast: Secondary | ICD-10-CM | POA: Diagnosis not present

## 2023-02-08 DIAGNOSIS — X58XXXA Exposure to other specified factors, initial encounter: Secondary | ICD-10-CM | POA: Diagnosis present

## 2023-02-08 DIAGNOSIS — F1290 Cannabis use, unspecified, uncomplicated: Secondary | ICD-10-CM | POA: Diagnosis present

## 2023-02-08 DIAGNOSIS — Z930 Tracheostomy status: Secondary | ICD-10-CM | POA: Diagnosis not present

## 2023-02-08 DIAGNOSIS — F3162 Bipolar disorder, current episode mixed, moderate: Secondary | ICD-10-CM

## 2023-02-08 DIAGNOSIS — Z430 Encounter for attention to tracheostomy: Secondary | ICD-10-CM

## 2023-02-08 DIAGNOSIS — J390 Retropharyngeal and parapharyngeal abscess: Secondary | ICD-10-CM | POA: Diagnosis present

## 2023-02-08 DIAGNOSIS — J982 Interstitial emphysema: Secondary | ICD-10-CM | POA: Diagnosis not present

## 2023-02-08 DIAGNOSIS — J988 Other specified respiratory disorders: Secondary | ICD-10-CM | POA: Diagnosis not present

## 2023-02-08 DIAGNOSIS — E876 Hypokalemia: Secondary | ICD-10-CM | POA: Diagnosis present

## 2023-02-08 DIAGNOSIS — Z79899 Other long term (current) drug therapy: Secondary | ICD-10-CM | POA: Diagnosis not present

## 2023-02-08 DIAGNOSIS — E282 Polycystic ovarian syndrome: Secondary | ICD-10-CM | POA: Diagnosis present

## 2023-02-08 DIAGNOSIS — Z818 Family history of other mental and behavioral disorders: Secondary | ICD-10-CM | POA: Diagnosis not present

## 2023-02-08 DIAGNOSIS — E669 Obesity, unspecified: Secondary | ICD-10-CM | POA: Diagnosis present

## 2023-02-08 DIAGNOSIS — Z7951 Long term (current) use of inhaled steroids: Secondary | ICD-10-CM

## 2023-02-08 DIAGNOSIS — R45851 Suicidal ideations: Secondary | ICD-10-CM

## 2023-02-08 DIAGNOSIS — F17200 Nicotine dependence, unspecified, uncomplicated: Secondary | ICD-10-CM | POA: Diagnosis present

## 2023-02-08 DIAGNOSIS — B954 Other streptococcus as the cause of diseases classified elsewhere: Secondary | ICD-10-CM | POA: Diagnosis present

## 2023-02-08 DIAGNOSIS — Z833 Family history of diabetes mellitus: Secondary | ICD-10-CM

## 2023-02-08 DIAGNOSIS — F410 Panic disorder [episodic paroxysmal anxiety] without agoraphobia: Secondary | ICD-10-CM | POA: Diagnosis present

## 2023-02-08 DIAGNOSIS — Z9151 Personal history of suicidal behavior: Secondary | ICD-10-CM

## 2023-02-08 DIAGNOSIS — Z7984 Long term (current) use of oral hypoglycemic drugs: Secondary | ICD-10-CM

## 2023-02-08 DIAGNOSIS — S51812A Laceration without foreign body of left forearm, initial encounter: Secondary | ICD-10-CM | POA: Diagnosis present

## 2023-02-08 DIAGNOSIS — F603 Borderline personality disorder: Secondary | ICD-10-CM | POA: Diagnosis present

## 2023-02-08 DIAGNOSIS — F6089 Other specific personality disorders: Secondary | ICD-10-CM | POA: Diagnosis present

## 2023-02-08 DIAGNOSIS — F4310 Post-traumatic stress disorder, unspecified: Secondary | ICD-10-CM | POA: Diagnosis present

## 2023-02-08 DIAGNOSIS — F17210 Nicotine dependence, cigarettes, uncomplicated: Secondary | ICD-10-CM | POA: Diagnosis present

## 2023-02-08 DIAGNOSIS — J391 Other abscess of pharynx: Secondary | ICD-10-CM

## 2023-02-08 DIAGNOSIS — Z7982 Long term (current) use of aspirin: Secondary | ICD-10-CM

## 2023-02-08 DIAGNOSIS — Z7289 Other problems related to lifestyle: Secondary | ICD-10-CM

## 2023-02-08 DIAGNOSIS — R0602 Shortness of breath: Principal | ICD-10-CM

## 2023-02-08 DIAGNOSIS — F411 Generalized anxiety disorder: Secondary | ICD-10-CM | POA: Diagnosis present

## 2023-02-08 HISTORY — DX: Other specified respiratory disorders: J98.8

## 2023-02-08 LAB — CBC WITH DIFFERENTIAL/PLATELET
Abs Immature Granulocytes: 0.06 10*3/uL (ref 0.00–0.07)
Basophils Absolute: 0 10*3/uL (ref 0.0–0.1)
Basophils Relative: 0 %
Eosinophils Absolute: 0 10*3/uL (ref 0.0–0.5)
Eosinophils Relative: 0 %
HCT: 37.1 % (ref 36.0–46.0)
Hemoglobin: 12.1 g/dL (ref 12.0–15.0)
Immature Granulocytes: 0 %
Lymphocytes Relative: 11 %
Lymphs Abs: 1.7 10*3/uL (ref 0.7–4.0)
MCH: 27.4 pg (ref 26.0–34.0)
MCHC: 32.6 g/dL (ref 30.0–36.0)
MCV: 83.9 fL (ref 80.0–100.0)
Monocytes Absolute: 1 10*3/uL (ref 0.1–1.0)
Monocytes Relative: 7 %
Neutro Abs: 12.2 10*3/uL — ABNORMAL HIGH (ref 1.7–7.7)
Neutrophils Relative %: 82 %
Platelets: 351 10*3/uL (ref 150–400)
RBC: 4.42 MIL/uL (ref 3.87–5.11)
RDW: 13.5 % (ref 11.5–15.5)
WBC: 15.1 10*3/uL — ABNORMAL HIGH (ref 4.0–10.5)
nRBC: 0 % (ref 0.0–0.2)

## 2023-02-08 LAB — COMPREHENSIVE METABOLIC PANEL
ALT: 12 U/L (ref 0–44)
AST: 27 U/L (ref 15–41)
Albumin: 3.1 g/dL — ABNORMAL LOW (ref 3.5–5.0)
Alkaline Phosphatase: 52 U/L (ref 38–126)
Anion gap: 10 (ref 5–15)
BUN: 13 mg/dL (ref 6–20)
CO2: 20 mmol/L — ABNORMAL LOW (ref 22–32)
Calcium: 8.3 mg/dL — ABNORMAL LOW (ref 8.9–10.3)
Chloride: 108 mmol/L (ref 98–111)
Creatinine, Ser: 0.78 mg/dL (ref 0.44–1.00)
GFR, Estimated: >60 mL/min (ref >60)
Glucose, Bld: 81 mg/dL (ref 70–99)
Potassium: 3.8 mmol/L (ref 3.5–5.1)
Sodium: 138 mmol/L (ref 135–145)
Total Bilirubin: 1.7 mg/dL — ABNORMAL HIGH (ref 0.3–1.2)
Total Protein: 6.6 g/dL (ref 6.5–8.1)

## 2023-02-08 MED ORDER — LORAZEPAM 2 MG/ML IJ SOLN
0.5000 mg | INTRAMUSCULAR | Status: DC | PRN
  Administered 2023-02-08 – 2023-02-10 (×4): 0.5 mg via INTRAVENOUS
  Filled 2023-02-08 (×4): qty 1

## 2023-02-08 MED ORDER — ENOXAPARIN SODIUM 40 MG/0.4ML IJ SOSY
40.0000 mg | PREFILLED_SYRINGE | INTRAMUSCULAR | Status: DC
Start: 2023-02-08 — End: 2023-02-15
  Administered 2023-02-08 – 2023-02-15 (×8): 40 mg via SUBCUTANEOUS
  Filled 2023-02-08 (×8): qty 0.4

## 2023-02-08 MED ORDER — ACETAMINOPHEN 650 MG RE SUPP: 650.0000 mg | Freq: Four times a day (QID) | RECTAL | Status: DC | PRN

## 2023-02-08 MED ORDER — ACETAMINOPHEN 325 MG PO TABS
650.0000 mg | ORAL_TABLET | Freq: Four times a day (QID) | ORAL | Status: DC | PRN
  Administered 2023-02-08 – 2023-02-13 (×5): 650 mg via ORAL
  Filled 2023-02-08 (×6): qty 2

## 2023-02-08 MED ORDER — ALBUTEROL SULFATE (2.5 MG/3ML) 0.083% IN NEBU
2.5000 mg | INHALATION_SOLUTION | RESPIRATORY_TRACT | Status: DC | PRN
  Administered 2023-02-09 – 2023-02-11 (×4): 2.5 mg via RESPIRATORY_TRACT
  Filled 2023-02-08 (×5): qty 3

## 2023-02-08 MED ORDER — GUAIFENESIN ER 600 MG PO TB12
600.0000 mg | ORAL_TABLET | Freq: Two times a day (BID) | ORAL | Status: DC
  Administered 2023-02-08 – 2023-02-15 (×14): 600 mg via ORAL
  Filled 2023-02-08 (×14): qty 1

## 2023-02-08 MED ORDER — GABAPENTIN 300 MG PO CAPS
300.0000 mg | ORAL_CAPSULE | Freq: Three times a day (TID) | ORAL | Status: DC
  Administered 2023-02-08 – 2023-02-15 (×19): 300 mg via ORAL
  Filled 2023-02-08 (×19): qty 1

## 2023-02-08 MED ORDER — ALBUTEROL SULFATE (2.5 MG/3ML) 0.083% IN NEBU
2.5000 mg | INHALATION_SOLUTION | Freq: Four times a day (QID) | RESPIRATORY_TRACT | Status: DC | PRN
  Administered 2023-02-08: 2.5 mg via RESPIRATORY_TRACT
  Filled 2023-02-08: qty 3

## 2023-02-08 MED ORDER — PRAZOSIN HCL 1 MG PO CAPS
1.0000 mg | ORAL_CAPSULE | Freq: Every day | ORAL | Status: DC
  Administered 2023-02-09 – 2023-02-14 (×6): 1 mg via ORAL
  Filled 2023-02-08 (×8): qty 1

## 2023-02-08 MED ORDER — DEXAMETHASONE SODIUM PHOSPHATE 4 MG/ML IJ SOLN
4.0000 mg | Freq: Two times a day (BID) | INTRAMUSCULAR | Status: DC
  Administered 2023-02-08 – 2023-02-11 (×7): 4 mg via INTRAVENOUS
  Filled 2023-02-08 (×7): qty 1

## 2023-02-08 MED ORDER — SODIUM CHLORIDE 0.9 % IV SOLN
3.0000 g | Freq: Four times a day (QID) | INTRAVENOUS | Status: DC
  Administered 2023-02-08 – 2023-02-15 (×26): 3 g via INTRAVENOUS
  Filled 2023-02-08 (×26): qty 8

## 2023-02-08 MED ORDER — OXCARBAZEPINE 300 MG PO TABS
300.0000 mg | ORAL_TABLET | Freq: Two times a day (BID) | ORAL | Status: DC
  Administered 2023-02-09 – 2023-02-15 (×13): 300 mg via ORAL
  Filled 2023-02-08 (×15): qty 1

## 2023-02-08 MED ORDER — SODIUM CHLORIDE 0.9% FLUSH
3.0000 mL | Freq: Two times a day (BID) | INTRAVENOUS | Status: DC
  Administered 2023-02-08 – 2023-02-14 (×11): 3 mL via INTRAVENOUS

## 2023-02-08 MED ORDER — SODIUM CHLORIDE 0.9 % IV SOLN
3.0000 g | Freq: Once | INTRAVENOUS | Status: AC
  Administered 2023-02-08: 3 g via INTRAVENOUS
  Filled 2023-02-08: qty 8

## 2023-02-08 NOTE — Progress Notes (Signed)
Patient refused her night meds (Trileptal 300 mg and Prazosin 1 mg). Made MD aware

## 2023-02-08 NOTE — Progress Notes (Signed)
  Subjective:  POD # 2 SP Tracheostomy and transoral incision and drainage of the parapharyngeal masticator space abscess by Dr. Irene Pap on 02/06/2023.  The patient left AMA yesterday due to social issues. I spoke with her on the phone today recommending she come back to the hospital.   Subjectively she feels a little better, she continues to note pain along right jaw and throat.   Objective: Vital signs in last 24 hours: Temp:  [98.1 F (36.7 C)] 98.1 F (36.7 C) (10/21 0948) Pulse Rate:  [69] 69 (10/21 0948) Resp:  [18] 18 (10/21 0948) BP: (126)/(97) 126/97 (10/21 0948) SpO2:  [100 %] 100 % (10/21 0948) FiO2 (%):  [28 %] 28 % (10/21 1017) Weight:  [93.3 kg] 93.3 kg (10/21 0953)     Physical Exam:  General: Pt resting comfortably in no acute distress   Neck: Supple, trach site is clean with no signs of infection, bleeding, or swelling. 6.0 cuffed Shiley in place, stay sutures in place. Phonating.  Larynx: bulging along the right pharyngeal space   Assessment/Plan:  POD # 2 SP Tracheostomy and transoral incision and drainage of the parapharyngeal masticator space abscess by Dr. Irene Pap on 02/06/2023  Pt seemed comfortable at the time of my exam. No issues with trach at this time. Continued swelling and fullness in pharyngeal space, no signs of worsening at this time.  Continue medical management per primary team. Continue to follow culture results. We will consider change to cuffless trach POD#3 if clinically doing well. We will continue to follow.    Kelle Darting Cylee Dattilo, PA-C 02/08/2023

## 2023-02-08 NOTE — ED Notes (Signed)
[facility] COMPLETED ORIGINAL IN RED FOLDER 3 COPIES ON CLIPBOARD IN BLUE ZONE AND 1 COPY IN MEDICAL RECORDS ORANGE ZONE EXP 02/14/23

## 2023-02-08 NOTE — Telephone Encounter (Signed)
I called Cheryl Tran, Cheryl Tran on the phone today. She left AMA SP I and D and Trach. I requested that she return to the hospital so that she could continue to ne monitored as well as receive appopriate antibiotics. She assured me that she was going to head back to the hospital immediately. I explained the importance of her returning, she verbalized understanding.

## 2023-02-08 NOTE — ED Provider Notes (Signed)
[place] EMERGENCY DEPARTMENT AT [facility] Provider Note   CSN: [id number] Arrival date & time: 02/08/23  9147     History  Chief Complaint  Patient presents with   Shortness of Breath    Cheryl Tran is a 32 y.o. female.   Shortness of Breath 32 year old female present for shortness of breath.  Patient initially presented October 19 with swelling and pain with swallowing and trouble breathing.  She was diagnosed with a right parapharyngeal and masticator space collection approximately 3 cm as well as a retropharyngeal abscess.  She underwent operative drainage and tracheostomy placement on the 19th.  She states yesterday she woke up and was very anxious for getting her recent medical problems including tracheostomy.  She ultimately became anxious and left AGAINST MEDICAL ADVICE.  However she is feeling more short of breath and states her doctor called her today to instruct her to return.  She has not taken any oral antibiotics that were prescribed.  No fevers or chills.  No chest pain.     Home Medications Prior to Admission medications   Medication Sig Start Date End Date Taking? Authorizing Provider  albuterol (PROVENTIL) (2.5 MG/3ML) 0.083% nebulizer solution Take 3 mLs (2.5 mg total) by nebulization every 6 (six) hours as needed for wheezing or shortness of breath. 04/29/22  Yes Hudnell, Judeth Cornfield, NP  albuterol (VENTOLIN HFA) 108 (90 Base) MCG/ACT inhaler Inhale 2 puffs into the lungs every 6 (six) hours as needed for wheezing or shortness of breath. 04/29/22  Yes Dulce Sellar, NP  amoxicillin-clavulanate (AUGMENTIN) 875-125 MG tablet Take 1 tablet by mouth 2 (two) times daily for 14 days. 02/07/23 02/21/23 Yes Dgayli, Lianne Bushy, MD  gabapentin (NEURONTIN) 300 MG capsule Take 1 capsule (300 mg total) by mouth 3 (three) times daily. 07/08/22  Yes Toy Cookey E, NP  ibuprofen (ADVIL) 800 MG tablet Take 800 mg by mouth daily as needed for headache or moderate pain  (pain score 4-6).   Yes [provider]  metFORMIN (GLUCOPHAGE) 500 MG tablet Take 1 tablet (500 mg total) by mouth 2 (two) times daily with a meal. 04/24/22  Yes Wyline Beady A, NP  Oxcarbazepine (TRILEPTAL) 300 MG tablet Take 1 tablet (300 mg total) by mouth 2 (two) times daily. 08/13/22  Yes   SYMBICORT 80-4.5 MCG/ACT inhaler Inhale 2 puffs into the lungs 2 (two) times daily. Patient taking differently: Inhale 2 puffs into the lungs 2 (two) times daily as needed (wheezing, shortness of breath). 04/29/22  Yes Dulce Sellar, NP      Allergies    Patient has no known allergies.    Review of Systems   Review of Systems  Respiratory:  Positive for shortness of breath.   Review of systems completed and notable as per HPI.  ROS otherwise negative.   Physical Exam Updated Vital Signs BP (!) 136/98   Pulse (!) 49   Temp 97.6 F (36.4 C) (Oral)   Resp 16   Ht 5\' 8"  (1.727 m)   Wt 93.3 kg   LMP 01/20/2023   SpO2 100%   BMI 31.27 kg/m  Physical Exam Vitals and nursing note reviewed.  Constitutional:      General: She is not in acute distress.    Appearance: She is well-developed.  HENT:     Head: Normocephalic and atraumatic.  Eyes:     Conjunctiva/sclera: Conjunctivae normal.  Cardiovascular:     Rate and Rhythm: Normal rate and regular rhythm.     Heart  sounds: No murmur heard. Pulmonary:     Effort: Pulmonary effort is normal. No respiratory distress.     Breath sounds: Normal breath sounds.     Comments: Tracheostomy in place Abdominal:     Palpations: Abdomen is soft.     Tenderness: There is no abdominal tenderness.  Musculoskeletal:        General: No swelling.     Cervical back: Neck supple.     Right lower leg: No edema.     Left lower leg: No edema.  Skin:    General: Skin is warm and dry.     Capillary Refill: Capillary refill takes less than 2 seconds.  Neurological:     Mental Status: She is alert.  Psychiatric:        Mood and Affect: Mood  normal.     ED Results / Procedures / Treatments   Labs (all labs ordered are listed, but only abnormal results are displayed) Labs Reviewed  COMPREHENSIVE METABOLIC PANEL - Abnormal; Notable for the following components:      Result Value   CO2 20 (*)    Calcium 8.3 (*)    Albumin 3.1 (*)    Total Bilirubin 1.7 (*)    All other components within normal limits  CBC WITH DIFFERENTIAL/PLATELET - Abnormal; Notable for the following components:   WBC 15.1 (*)    Neutro Abs 12.2 (*)    All other components within normal limits  CBC  BASIC METABOLIC PANEL    EKG None  Radiology DG Chest Port 1 View  Result Date: 02/08/2023 CLINICAL DATA:  Shortness of breath.  Smoker. EXAM: PORTABLE CHEST 1 VIEW COMPARISON:  02/06/2023. FINDINGS: Since the prior study, patient underwent tracheostomy tube placement. The tip of which is approximately 4.8 cm above the carina. There is new surgical emphysema along the right lower neck as well as subtle ill-defined linear lucencies in the right paratracheal region and along the right heart border, for which trace pneumomediastinum can not be excluded on this exam. No pneumothorax seen. Bilateral lung fields are clear. Bilateral costophrenic angles are clear. Normal cardio-mediastinal silhouette. No acute osseous abnormalities. The soft tissues are within normal limits. IMPRESSION: *Interval tracheostomy tube placement. Possible trace pneumomediastinum. No pneumothorax. Electronically Signed   By: Jules Schick M.D.   On: 02/08/2023 11:27    Procedures Procedures    Medications Ordered in ED Medications  dexamethasone (DECADRON) injection 4 mg (4 mg Intravenous Given 02/08/23 1046)  LORazepam (ATIVAN) injection 0.5 mg (0.5 mg Intravenous Given 02/08/23 1053)  enoxaparin (LOVENOX) injection 40 mg (40 mg Subcutaneous Given 02/08/23 1351)  sodium chloride flush (NS) 0.9 % injection 3 mL (3 mLs Intravenous Not Given 02/08/23 1059)  acetaminophen (TYLENOL)  tablet 650 mg (has no administration in time range)    Or  acetaminophen (TYLENOL) suppository 650 mg (has no administration in time range)  albuterol (PROVENTIL) (2.5 MG/3ML) 0.083% nebulizer solution 2.5 mg (has no administration in time range)  Ampicillin-Sulbactam (UNASYN) 3 g in sodium chloride 0.9 % 100 mL IVPB (has no administration in time range)  Ampicillin-Sulbactam (UNASYN) 3 g in sodium chloride 0.9 % 100 mL IVPB (0 g Intravenous Stopped 02/08/23 1046)    ED Course/ Medical Decision Making/ A&P Clinical Course as of 02/08/23 1711  Mon Feb 08, 2023  [address] [place]: admit medicine, discuss with Levon Hedger [JD]    Clinical Course User Index [JD] Laurence Spates, MD  Medical Decision Making Amount and/or Complexity of Data Reviewed Labs: ordered. Radiology: ordered.  Risk Decision regarding hospitalization.   Medical Decision Making:   Dessiree Portalatin is a 32 y.o. female who presented to the ED today with shortness of breath after leaving against medical advice after tracheostomy for RPA.  I suspect this is related to recent tracheostomy placement for RPA.  She arrives hemoglobin stable, tracheostomy in place.  Normal respiratory effort no hypoxia.  She feels somewhat short of breath.  No fever or signs of sepsis.  She is recently mated for RPA which was drained and tracheostomy was placed.  I ordered IV antibiotics given she has not taken her antibiotics today.  I spoke with Dr. Irene Pap with ENT who did her procedure, she recommends patient be admitted to the ICU given her fresh tracheostomy.  I spoke with the critical care team and she was admitted for further care.   Patient placed on continuous vitals and telemetry monitoring while in ED which was reviewed periodically.  Reviewed and confirmed nursing documentation for past medical history, family history, social history.  Patient's presentation is most consistent with acute presentation  with potential threat to life or bodily function.           Final Clinical Impression(s) / ED Diagnoses Final diagnoses:  Shortness of breath    Rx / DC Orders ED Discharge Orders     None         Laurence Spates, MD 02/08/23 1711
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
## 2023-02-08 NOTE — ED Triage Notes (Signed)
Worsening shortness of breath. New trach from a few days ago and was in the ICU. O2 at 100% on RA. Pt is currently on abx for retropharyngeal abscess.

## 2023-02-08 NOTE — Progress Notes (Signed)
02/08/2023     I have seen and evaluated the patient for post tracheostomy status.   S:   See note from 10/20 from Dr. Aundria Rud. Coming back in after urging from ENT. Having some dyspnea but more just anxiety/overwhelmed from everything going on. Admits to some passive SI, wants to talk to psych.   O: Blood pressure 96/66, pulse 64, temperature 97.6 F (36.4 C), temperature source Oral, resp. rate 17, height 5\' 3"  (1.6 m), weight 81.6 kg, SpO2 94 %.  Able to phonate in short sentences Comfortable breathing pattern Lungs clear Trach CDI Some swelling noted on R  CXR looks okay   A:  # Retropharyngeal Abscess after teeth being pulled #Upper Airway Obstruction s/p trach #History of Asthma #Bipolar 1  P:  - Okay for stepdown w/ downsize vs. Capping at ENT discretion - Psych consult for passive SI, bipolar - Patient requesting anxiolytic: PRN ativan low dose ordered - Unasyn and decadron - TRH to admit to progressive, will follow peripherally, please reach out if any questions or concerns  Myrla Halsted MD [facility] Prefer epic messenger for cross cover needs If after hours, please call E-link

## 2023-02-08 NOTE — H&P (Signed)
History and Physical    Patient: Cheryl Tran ZOX:[id number] DOB: July 19, 1990 DOA: 02/08/2023 DOS: the patient was seen and examined on 02/08/2023 PCP: Dulce Sellar, NP  Patient coming from: Home  Chief Complaint:  Chief Complaint  Patient presents with   Shortness of Breath   HPI: Cheryl Tran is a 32 y.o. female with medical history significant of PCOS, asthma, bipolar disorder, anxiety, depression, tobacco abuse, and obesity who presents with complaints of shortness of breath.  She had initially been hospitalized 10/19-10/21 on the PCCM service.  Patient reported that after having right wisdom teeth pulled on 10/15 that she started to have progressive swelling, painful swallowing, and changes in voice.  She had been found to have right pharyngeal and retropharyngeal abscess.  She had been taken urgently to the operating room with ENT for I&D and had trach placed.  The following day patient was noted to have gotten an argument with her partner and her hospital room prior to signing out and leaving AMA.  After getting home patient reports that she had worsening shortness of breath, productive cough, fever up to 100 F, and had admitted to having thoughts of suicide.  She normally smokes 2 black and mild per day on average declines need of nicotine patch.   In the emergency department patient was noted to be afebrile with respirations up to 28, blood pressures 131/83 Tylenol 156/108, and O2 saturations currently maintained on trach collar.  Chest x-ray had noted interval placement of tracheostomy and possible trace pneumomediastinum.  TRH was consulted to admit.  Review of Systems: As mentioned in the history of present illness. All other systems reviewed and are negative. Past Medical History:  Diagnosis Date   Anxiety    Asthma    Bipolar 1 disorder (HCC)    Depression    Past Surgical History:  Procedure Laterality Date   KNEE ARTHROSCOPY WITH ANTERIOR CRUCIATE LIGAMENT (ACL) REPAIR  Right 08/18/2022   Procedure: RIGHT KNEE ARTHROSCOPY WITH ANTERIOR CRUCIATE LIGAMENT (ACL) REPAIR;  Surgeon: Huel Cote, MD;  Location: [facility];  Service: Orthopedics;  Laterality: Right;   KNEE ARTHROSCOPY WITH MACI CARTILAGE HARVEST Right 06/22/2022   Procedure: RIGHT KNEE ARTHROSCOPY PATELLA CHONDROPLASTY  WITH MACI CARTILAGE HARVEST;  Surgeon: Huel Cote, MD;  Location: [facility];  Service: Orthopedics;  Laterality: Right;   OSTEOCHONDRAL DEFECT REPAIR/RECONSTRUCTION Right 08/18/2022   Procedure: OSTEOCHONDRAL DEFECT REPAIR/RECONSTRUCTION/MACI IMPLANTATION;  Surgeon: Huel Cote, MD;  Location: [facility];  Service: Orthopedics;  Laterality: Right;   Social History:  reports that she has been smoking cigarettes and cigars. She has never used smokeless tobacco. She reports current alcohol use. She reports current drug use. Drug: Marijuana.  No Known Allergies  Family History  Problem Relation Age of Onset   Neuropathy Mother    Addison's disease Mother    Hypertension Mother    Pernicious anemia Mother    Hypertension Father    Diabetes Father    Graves' disease Sister    Bipolar disorder Sister    Breast cancer Maternal Grandmother    Hyperthyroidism Maternal Grandmother     Prior to Admission medications   Medication Sig Start Date End Date Taking? Authorizing Provider  acetaminophen (TYLENOL) 325 MG tablet     [provider]  albuterol (PROVENTIL) (2.5 MG/3ML) 0.083% nebulizer solution Take 3 mLs (2.5 mg total) by nebulization every 6 (six) hours as needed for wheezing or shortness of breath. 04/29/22   Dulce Sellar, NP  albuterol (VENTOLIN HFA) 108 (90 Base) MCG/ACT inhaler Inhale 2 puffs into the lungs every 6 (six) hours as needed for wheezing or shortness of breath. 04/29/22   Dulce Sellar, NP  amoxicillin-clavulanate (AUGMENTIN) 875-125 MG tablet Take 1 tablet by mouth 2 (two) times daily for 14  days. 02/07/23 02/21/23  Raechel Chute, MD  ARIPiprazole (ABILIFY) 5 MG tablet Take 1 tablet (5 mg total) by mouth daily in the morning. Increase to 2 tablets (10 mg total) daily after 5 days. 08/13/22     aspirin EC 325 MG tablet Take 1 tablet (325 mg total) by mouth daily. 08/19/22   Huel Cote, MD  clomiPHENE (CLOMID) 50 MG tablet Take 1 tablet (50 mg total) by mouth daily. Take cycle days 3-7 08/11/22   Olivia Mackie, NP  fluticasone (FLOVENT HFA) 44 MCG/ACT inhaler     [provider]  gabapentin (NEURONTIN) 300 MG capsule Take 1 capsule (300 mg total) by mouth 3 (three) times daily. Patient not taking: Reported on 08/11/2022 07/08/22   Shanna Cisco, NP  metFORMIN (GLUCOPHAGE) 500 MG tablet Take 1 tablet (500 mg total) by mouth 2 (two) times daily with a meal. 04/24/22   Olivia Mackie, NP  Oxcarbazepine (TRILEPTAL) 300 MG tablet Take 1 tablet (300 mg total) by mouth 2 (two) times daily. 08/13/22     oxyCODONE (OXY IR/ROXICODONE) 5 MG immediate release tablet Take 1 tablet (5 mg total) by mouth every 4 (four) hours as needed (severe pain). 09/03/22   Huel Cote, MD  oxyCODONE (ROXICODONE) 5 MG immediate release tablet Take 1 tablet (5 mg total) by mouth every 4 (four) hours as needed for severe pain or breakthrough pain. 08/19/22   Huel Cote, MD  prazosin (MINIPRESS) 2 MG capsule Take 1 capsule (2 mg total) by mouth at bedtime. 08/13/22     SYMBICORT 80-4.5 MCG/ACT inhaler Inhale 2 puffs into the lungs 2 (two) times daily. 04/29/22   Dulce Sellar, NP    Physical Exam: Vitals:   02/08/23 0948 02/08/23 0953  BP: (!) 126/97   Pulse: 69   Resp: 18   Temp: 98.1 F (36.7 C)   SpO2: 100%   Weight:  93.3 kg  Height:  5\' 8"  (1.727 m)   Constitutional: Young female currently in NAD, calm, comfortable Eyes: PERRL, lids and conjunctivae normal ENMT: Mucous membranes are moist.  Normal dentition.  Neck: supple, tracheostomy in place with clear  secretions Respiratory: clear to auscultation bilaterally.  Normal respiratory effort. No accessory muscle use.  On trach collar. Cardiovascular: Regular rate and rhythm, no murmurs / rubs / gallops. No extremity edema.  Abdomen: no tenderness, no masses palpated. No  Bowel sounds positive.  Musculoskeletal: no clubbing / cyanosis. No joint deformity upper and lower extremities. Good ROM, no contractures. Normal muscle tone.  Skin: no rashes, lesions, ulcers. No induration Neurologic: CN 2-12 grossly intact.  Strength 5/5 in all 4.  Psychiatric: Normal judgment and insight. Alert and oriented x 3. Normal mood.   Data Reviewed:  Reviewed labs, imaging, and pertinent records as documented.  Assessment and Plan: Retropharyngeal abscess Upper airway obstruction s/p tracheostomy Patient initially presented recently had wisdom teeth removed found to have a retropharyngeal abscess with airway obstruction for which she was taken for I&D and debridement 10/19.  Subsequently, patient left AGAINST MEDICAL ADVICE the following day, but reported worsening breathing.  Chest x-ray noting interval placement of tracheostomy with possible trace pneumomediastinum.  Patient had been evaluated by PCCU given  Unasyn IV and started on Decadron IV. -Admit to a progressive bed -Continuous pulse oximetry with oxygen maintain O2 saturations greater than 92% -Continue empiric antibiotics of Unasyn -Continue Decadron 4 mg IV every 12 hours -Appreciate PCCM/ENT consultative services, will follow-up for any further recommendations  Suicidal ideations Depression/bipolar 1 disorder Patient had expressed thoughts of wanting to harm herself in the emergency department for which psychiatry have been formally consulted and patient was IVC.  Psychiatry recommends inpatient psychiatric hospitalization after medical hospitalization. -Sitter to bedside -Resume home Trileptal 300 mg twice daily, gabapentin 300 mg 3 times daily,  and -Appreciate psychiatry consultative services,  will follow-up for any further recommendations  Asthma, without exacerbation Patient without significant wheezing noted on physical exam. -DuoNebs as needed for shortness of breath/wheezing  PCOS -hold metformin  Tobacco use disorder Patient reports smoking 2 black and milds per day on average.  Declines nicotine patch. -Continue to encourage cessation  Obesity BMI 31.27 kg/m  DVT prophylaxis: Lovenox Advance Care Planning:   Code Status: Full Code   Consults: ENT, PCCM, psychiatry  Family Communication: Patient's mother updated over the phone.  Severity of Illness: The appropriate patient status for this patient is INPATIENT. Inpatient status is judged to be reasonable and necessary in order to provide the required intensity of service to ensure the patient's safety. The patient's presenting symptoms, physical exam findings, and initial radiographic and laboratory data in the context of their chronic comorbidities is felt to place them at high risk for further clinical deterioration. Furthermore, it is not anticipated that the patient will be medically stable for discharge from the hospital within 2 midnights of admission.   * I certify that at the point of admission it is my clinical judgment that the patient will require inpatient hospital care spanning beyond 2 midnights from the point of admission due to high intensity of service, high risk for further deterioration and high frequency of surveillance required.*  Author: Clydie Braun, MD 02/08/2023 10:24 AM  For on call review [email or web address].
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
## 2023-02-09 DIAGNOSIS — J390 Retropharyngeal and parapharyngeal abscess: Secondary | ICD-10-CM | POA: Diagnosis not present

## 2023-02-09 DIAGNOSIS — Z930 Tracheostomy status: Secondary | ICD-10-CM | POA: Diagnosis not present

## 2023-02-09 LAB — CBC
HCT: 36.6 % (ref 36.0–46.0)
Hemoglobin: 12.3 g/dL (ref 12.0–15.0)
MCH: 27.5 pg (ref 26.0–34.0)
MCHC: 33.6 g/dL (ref 30.0–36.0)
MCV: 81.7 fL (ref 80.0–100.0)
Platelets: 355 10*3/uL (ref 150–400)
RBC: 4.48 MIL/uL (ref 3.87–5.11)
RDW: 13.4 % (ref 11.5–15.5)
WBC: 13.8 10*3/uL — ABNORMAL HIGH (ref 4.0–10.5)
nRBC: 0 % (ref 0.0–0.2)

## 2023-02-09 LAB — BASIC METABOLIC PANEL
Anion gap: 12 (ref 5–15)
BUN: 12 mg/dL (ref 6–20)
CO2: 20 mmol/L — ABNORMAL LOW (ref 22–32)
Calcium: 8.7 mg/dL — ABNORMAL LOW (ref 8.9–10.3)
Chloride: 105 mmol/L (ref 98–111)
Creatinine, Ser: 0.62 mg/dL (ref 0.44–1.00)
GFR, Estimated: >60 mL/min (ref >60)
Glucose, Bld: 100 mg/dL — ABNORMAL HIGH (ref 70–99)
Potassium: 3.7 mmol/L (ref 3.5–5.1)
Sodium: 137 mmol/L (ref 135–145)

## 2023-02-09 LAB — FOLATE: Folate: 12.9 ng/mL (ref >5.9)

## 2023-02-09 LAB — VITAMIN B12: Vitamin B-12: 310 pg/mL (ref 180–914)

## 2023-02-09 MED ORDER — OXYCODONE HCL 5 MG PO TABS
5.0000 mg | ORAL_TABLET | ORAL | Status: AC | PRN
Start: 2023-02-09 — End: 2023-02-10
  Administered 2023-02-09 – 2023-02-10 (×2): 5 mg via ORAL
  Filled 2023-02-09 (×2): qty 1
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
## 2023-02-09 NOTE — Plan of Care (Signed)
  Problem: Education: Goal: Knowledge of General Education information will improve Description: Including pain rating scale, medication(s)/side effects and non-pharmacologic comfort measures Outcome: Progressing   Problem: Clinical Measurements: Goal: Will remain free from infection Outcome: Progressing Goal: Respiratory complications will improve Outcome: Progressing   Problem: Coping: Goal: Level of anxiety will decrease Outcome: Progressing   Problem: Safety: Goal: Ability to remain free from injury will improve Outcome: Progressing   Problem: Self-Concept: Goal: Level of anxiety will decrease Outcome: Progressing

## 2023-02-09 NOTE — Plan of Care (Signed)
  Problem: Education: Goal: Knowledge of General Education information will improve Description: Including pain rating scale, medication(s)/side effects and non-pharmacologic comfort measures Outcome: Progressing   Problem: Health Behavior/Discharge Planning: Goal: Ability to manage health-related needs will improve Outcome: Progressing   Problem: Clinical Measurements: Goal: Ability to maintain clinical measurements within normal limits will improve Outcome: Progressing Goal: Will remain free from infection Outcome: Progressing Goal: Diagnostic test results will improve Outcome: Progressing Goal: Respiratory complications will improve Outcome: Progressing Goal: Cardiovascular complication will be avoided Outcome: Progressing   Problem: Activity: Goal: Risk for activity intolerance will decrease Outcome: Progressing   Problem: Nutrition: Goal: Adequate nutrition will be maintained Outcome: Progressing   Problem: Coping: Goal: Level of anxiety will decrease Outcome: Progressing   Problem: Elimination: Goal: Will not experience complications related to bowel motility Outcome: Progressing Goal: Will not experience complications related to urinary retention Outcome: Progressing   Problem: Pain Managment: Goal: General experience of comfort will improve Outcome: Progressing   Problem: Safety: Goal: Ability to remain free from injury will improve Outcome: Progressing   Problem: Skin Integrity: Goal: Risk for impaired skin integrity will decrease Outcome: Progressing   Problem: Education: Goal: Ability to state activities that reduce stress will improve Outcome: Progressing   Problem: Coping: Goal: Ability to identify and develop effective coping behavior will improve Outcome: Progressing   Problem: Self-Concept: Goal: Ability to identify factors that promote anxiety will improve Outcome: Progressing Goal: Level of anxiety will decrease Outcome:  Progressing Goal: Ability to modify response to factors that promote anxiety will improve Outcome: Progressing   

## 2023-02-09 NOTE — TOC Initial Note (Signed)
Transition of Care [facility]) - Initial/Assessment Note    Patient Details  Name: Cheryl Tran MRN: [id number] Date of Birth: February 22, 1991  Transition of Care [facility]) CM/SW Contact:    Mearl Latin, LCSW Phone Number: 02/09/2023, 9:36 AM  Clinical Narrative:                 Patient admitted from home where she recently left the hospital AMA after a new tracheostomy placement. Psych now recommending in patient psych placement at discharge due to suicidal statements. Barrier for placement will be new trach needs. CSW continuing to follow.     Barriers to Discharge: Continued Medical Work up, Patient left Against Medical Advice (AMA), Psych Bed not available (new trach and requiring psych placement)   Patient Goals and CMS Choice            Expected Discharge Plan and Services In-house Referral: Clinical Social Work     Living arrangements for the past 2 months: Apartment                                      Prior Living Arrangements/Services Living arrangements for the past 2 months: Apartment Lives with:: Self Patient language and need for interpreter reviewed:: Yes        Need for Family Participation in Patient Care: Yes (Comment) Care giver support system in place?: No (comment)   Criminal Activity/Legal Involvement Pertinent to Current Situation/Hospitalization: No - Comment as needed  Activities of Daily Living   ADL Screening (condition at time of admission) Independently performs ADLs?: Yes (appropriate for developmental age) Is the patient deaf or have difficulty hearing?: No Does the patient have difficulty seeing, even when wearing glasses/contacts?: No Does the patient have difficulty concentrating, remembering, or making decisions?: No  Permission Sought/Granted                  Emotional Assessment Appearance:: Appears stated age     Orientation: : Oriented to Self, Oriented to Place, Oriented to  Time, Oriented to Situation Alcohol /  Substance Use: Not Applicable Psych Involvement: Yes (comment) (Recommending psych placement)  Admission diagnosis:  Retropharyngeal abscess [J39.0] Shortness of breath [R06.02] Patient Active Problem List   Diagnosis Date Noted   Airway obstruction 02/08/2023   Suicidal ideations 02/08/2023   Tobacco use disorder 02/08/2023   Obesity (BMI 30-39.9) 02/08/2023   Retropharyngeal and parapharyngeal abscess 02/07/2023   Critical airway 02/06/2023   Retropharyngeal abscess 02/06/2023   Tracheostomy in place [facility]) 02/06/2023   Chondral defect of right patella 08/18/2022   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 [place] [address] Phone: [phone] Fax: [phone]  [facility] [address] [place] [address] Phone: [phone] Fax: [phone]  [facility] [phone] - [place], [place] [address] AT [facility] [address] [facility] [address] [place] [address] Phone: [phone] Fax: [phone]     Social Determinants of Health (SDOH) Social History: SDOH Screenings   Food Insecurity: No  Food Insecurity (02/08/2023)  Housing: Patient Declined (02/08/2023)  Transportation Needs: No Transportation Needs (02/08/2023)  Utilities: Not At Risk (02/08/2023)  Alcohol Screen: Low Risk  (05/12/2022)  Depression (PHQ2-9): High Risk (07/08/2022)  Financial Resource Strain: Medium Risk (05/12/2022)  Physical Activity: Inactive (05/12/2022)  Social Connections: Unknown (10/06/2022)   Received from Ronnald Collum  Stress: Stress Concern Present (05/12/2022)  Tobacco Use: High Risk (02/08/2023)   SDOH Interventions:     Readmission Risk Interventions     No data to display

## 2023-02-09 NOTE — Progress Notes (Signed)
PROGRESS NOTE    Cheryl Tran  ZOX:[id number] DOB: 10/03/1990 DOA: 02/08/2023 PCP: Dulce Sellar, NP   Chief Complaint  Patient presents with   Shortness of Breath    Brief Narrative:   Cheryl Tran is a 32 y.o. female with medical history significant of PCOS, asthma, bipolar disorder, anxiety, depression, tobacco abuse, and obesity who presents with complaints of shortness of breath.  She had initially been hospitalized 10/19-10/21 on the PCCM service.  Patient reported that after having right wisdom teeth pulled on 10/15 that she started to have progressive swelling, painful swallowing, and changes in voice.  She had been found to have right pharyngeal and retropharyngeal abscess.  She had been taken urgently to the operating room with ENT for I&D and had trach placed.  The following day patient was noted to have gotten an argument with her partner and her hospital room prior to signing out and leaving AMA.  After getting home patient reports that she had worsening shortness of breath, productive cough, fever up to 100 F, and had admitted to having thoughts of suicide.  She normally smokes 2 black and mild per day on average declines need of nicotine patch.     In the emergency department patient was noted to be afebrile with respirations up to 28, blood pressures 131/83 Tylenol 156/108, and O2 saturations currently maintained on trach collar.  Chest x-ray had noted interval placement of tracheostomy and possible trace pneumomediastinum.  TRH was consulted to admit.    Assessment & Plan:   Principal Problem:   Retropharyngeal abscess Active Problems:   Tracheostomy in place [facility])   Airway obstruction   Bipolar 1 disorder, mixed, moderate (HCC)   Suicidal ideations   Asthma   PCOS (polycystic ovarian syndrome)   Tobacco use disorder   Obesity (BMI 30-39.9)  Retropharyngeal abscess Upper airway obstruction s/p tracheostomy -Patient with recent hospitalization, she left AMA, but  given worsening breathing she came back to ED . -Continue with IV Decadron  -continue with IV Unasyn  -ENT input greatly appreciated, they may consider cuffless trach and postop day #3.    Suicidal ideations Depression/bipolar 1 disorder Patient had expressed thoughts of wanting to harm herself in the emergency department for which psychiatry have been formally consulted and patient was IVC.  Psychiatry recommends inpatient psychiatric hospitalization after medical hospitalization. -Sitter to bedside -Appreciate psychiatry consultative services,  will follow-up for any further recommendations   Asthma, without exacerbation Patient without significant wheezing noted on physical exam. -DuoNebs as needed for shortness of breath/wheezing   PCOS -hold metformin   Tobacco use disorder Patient reports smoking 2 black and milds per day on average.  Declines nicotine patch. -Continue to encourage cessation   Obesity BMI 31.27 kg/m    DVT prophylaxis: Lovenox Code Status: Full Family Communication: none at [place] Disposition:   Status is: Inpatient    Consultants:  ENT PCCM Psychiatry   Subjective:  No significant events overnight as discussed with staff, this morning she is emotional, tearful  Objective: Vitals:   02/09/23 0357 02/09/23 0446 02/09/23 0730 02/09/23 1252  BP:  130/89 (!) 154/83 (!) 138/101  Pulse:  (!) 57 (!) 50 65  Resp:  18 18 18   Temp:  98.3 F (36.8 C) 98.3 F (36.8 C)   TempSrc:  Oral Oral   SpO2: 100% 100% 100% 100%  Weight:      Height:        Intake/Output Summary (Last 24 hours) at 02/09/2023 1505 Last  data filed at 02/09/2023 0930 Gross per 24 hour  Intake 243 ml  Output --  Net 243 ml   Filed Weights   02/08/23 0953  Weight: 93.3 kg    Examination:  Awake Alert, she is anxious, tearful Symmetrical Chest wall movement, Good air movement bilaterally, CTAB RRR,No Gallops,Rubs or new Murmurs, No Parasternal Heave +ve  B.Sounds, Abd Soft, No tenderness, No rebound - guarding or rigidity. No Cyanosis, Clubbing or edema, No new Rash or bruise    Patient was seen and examined with female chaperone at bedside, her sitter CNA Orpah Melter    Data Reviewed: I have personally reviewed following labs and imaging studies  CBC: Recent Labs  Lab 02/06/23 0358 02/07/23 1048 02/08/23 1038 02/09/23 0333  WBC 17.2* 18.9* 15.1* 13.8*  NEUTROABS 14.6* 17.8* 12.2*  --   HGB 13.6 13.6 12.1 12.3  HCT 41.7 42.1 37.1 36.6  MCV 85.5 84.5 83.9 81.7  PLT 315 364 351 355    Basic Metabolic Panel: Recent Labs  Lab 02/06/23 0358 02/07/23 1048 02/08/23 1038 02/09/23 0333  NA 137 141 138 137  K 3.4* 3.7 3.8 3.7  CL 100 106 108 105  CO2 22 20* 20* 20*  GLUCOSE 117* 96 81 100*  BUN 5* 12 13 12   CREATININE 0.82 0.82 0.78 0.62  CALCIUM 9.3 9.0 8.3* 8.7*    GFR: Estimated Creatinine Clearance: 119.5 mL/min (by C-G formula based on SCr of 0.62 mg/dL).  Liver Function Tests: Recent Labs  Lab 02/06/23 0358 02/08/23 1038  AST 17 27  ALT 12 12  ALKPHOS 49 52  BILITOT 0.8 1.7*  PROT 6.9 6.6  ALBUMIN 3.4* 3.1*    CBG: Recent Labs  Lab 02/06/23 1917 02/06/23 2302 02/07/23 0311 02/07/23 0726 02/07/23 1127  GLUCAP 85 120* 109* 108* 116*     Recent Results (from the past 240 hour(s))  Blood culture (routine x 2)     Status: None (Preliminary result)   Collection Time: 02/06/23  9:17 AM   Specimen: BLOOD RIGHT HAND  Result Value Ref Range Status   Specimen Description BLOOD RIGHT HAND  Final   Special Requests   Final    BOTTLES DRAWN AEROBIC ONLY Blood Culture adequate volume   Culture   Final    NO GROWTH 3 DAYS Performed at [facility] Lab, [address], [place], [place] [address]    Report Status PENDING  Incomplete  Blood culture (routine x 2)     Status: None (Preliminary result)   Collection Time: 02/06/23  9:22 AM   Specimen: BLOOD RIGHT HAND  Result Value Ref Range Status   Specimen  Description BLOOD RIGHT HAND  Final   Special Requests   Final    BOTTLES DRAWN AEROBIC AND ANAEROBIC Blood Culture adequate volume   Culture   Final    NO GROWTH 3 DAYS Performed at [facility] Lab, [address], [place], [place] [address]    Report Status PENDING  Incomplete  Aerobic/Anaerobic Culture w Gram Stain (surgical/deep wound)     Status: None (Preliminary result)   Collection Time: 02/06/23 11:23 AM   Specimen: Path fluid; Body Fluid  Result Value Ref Range Status   Specimen Description ABSCESS  Final   Special Requests trans oral  Final   Gram Stain   Final    FEW WBC SEEN ABUNDANT GRAM POSITIVE COCCI MODERATE GRAM NEGATIVE RODS Performed at [facility] Lab, [address] [place], [place] [address]  Culture   Final    FEW STREPTOCOCCUS GROUP F Beta hemolytic streptococci are predictably susceptible to penicillin and other beta lactams. Susceptibility testing not routinely performed. MODERATE EIKENELLA CORRODENS Usually susceptible to penicillin and other beta lactam agents,quinolones,macrolides and tetracyclines. NO ANAEROBES ISOLATED; CULTURE IN PROGRESS FOR 5 DAYS    Report Status PENDING  Incomplete  MRSA Next Gen by PCR, Nasal     Status: None   Collection Time: 02/06/23 12:58 PM   Specimen: Nasal Mucosa; Nasal Swab  Result Value Ref Range Status   MRSA by PCR Next Gen NOT DETECTED NOT DETECTED Final    Comment: (NOTE) The GeneXpert MRSA Assay (FDA approved for NASAL specimens only), is one component of a comprehensive MRSA colonization surveillance program. It is not intended to diagnose MRSA infection nor to guide or monitor treatment for MRSA infections. Test performance is not FDA approved in patients less than 59 years old. Performed at [facility] Lab, [address] [place], [place] [address]          Radiology Studies: DG Chest Port 1 View  Result Date: 02/08/2023 CLINICAL DATA:  Shortness of breath.  Smoker. EXAM: PORTABLE  CHEST 1 VIEW COMPARISON:  02/06/2023. FINDINGS: Since the prior study, patient underwent tracheostomy tube placement. The tip of which is approximately 4.8 cm above the carina. There is new surgical emphysema along the right lower neck as well as subtle ill-defined linear lucencies in the right paratracheal region and along the right heart border, for which trace pneumomediastinum can not be excluded on this exam. No pneumothorax seen. Bilateral lung fields are clear. Bilateral costophrenic angles are clear. Normal cardio-mediastinal silhouette. No acute osseous abnormalities. The soft tissues are within normal limits. IMPRESSION: *Interval tracheostomy tube placement. Possible trace pneumomediastinum. No pneumothorax. Electronically Signed   By: Jules Schick M.D.   On: 02/08/2023 11:27        Scheduled Meds:  dexamethasone (DECADRON) injection  4 mg Intravenous Q12H   enoxaparin (LOVENOX) injection  40 mg Subcutaneous Q24H   gabapentin  300 mg Oral TID   guaiFENesin  600 mg Oral BID   Oxcarbazepine  300 mg Oral BID   prazosin  1 mg Oral QHS   sodium chloride flush  3 mL Intravenous Q12H   Continuous Infusions:  ampicillin-sulbactam (UNASYN) IV 3 g (02/09/23 1012)     LOS: 1 day      Huey Bienenstock, MD [facility]   To contact the attending provider between 7A-7P or the covering provider during after hours 7P-7A, please log into the web site [email or web address] and access using universal [place] password for that web site. If you do not have the password, please call the hospital operator.  02/09/2023, 3:05 PM

## 2023-02-10 DIAGNOSIS — F3162 Bipolar disorder, current episode mixed, moderate: Secondary | ICD-10-CM | POA: Diagnosis not present

## 2023-02-10 DIAGNOSIS — Z930 Tracheostomy status: Secondary | ICD-10-CM | POA: Diagnosis not present

## 2023-02-10 DIAGNOSIS — J988 Other specified respiratory disorders: Secondary | ICD-10-CM | POA: Diagnosis not present

## 2023-02-10 DIAGNOSIS — J390 Retropharyngeal and parapharyngeal abscess: Secondary | ICD-10-CM | POA: Diagnosis not present

## 2023-02-10 LAB — CBC
HCT: 36.9 % (ref 36.0–46.0)
Hemoglobin: 12.6 g/dL (ref 12.0–15.0)
MCH: 27.9 pg (ref 26.0–34.0)
MCHC: 34.1 g/dL (ref 30.0–36.0)
MCV: 81.6 fL (ref 80.0–100.0)
Platelets: 371 10*3/uL (ref 150–400)
RBC: 4.52 MIL/uL (ref 3.87–5.11)
RDW: 13.1 % (ref 11.5–15.5)
WBC: 14.7 10*3/uL — ABNORMAL HIGH (ref 4.0–10.5)
nRBC: 0 % (ref 0.0–0.2)

## 2023-02-10 LAB — BASIC METABOLIC PANEL
Anion gap: 11 (ref 5–15)
BUN: 10 mg/dL (ref 6–20)
CO2: 22 mmol/L (ref 22–32)
Calcium: 8.7 mg/dL — ABNORMAL LOW (ref 8.9–10.3)
Chloride: 105 mmol/L (ref 98–111)
Creatinine, Ser: 0.66 mg/dL (ref 0.44–1.00)
GFR, Estimated: >60 mL/min (ref >60)
Glucose, Bld: 120 mg/dL — ABNORMAL HIGH (ref 70–99)
Potassium: 3.4 mmol/L — ABNORMAL LOW (ref 3.5–5.1)
Sodium: 138 mmol/L (ref 135–145)

## 2023-02-10 MED ORDER — POTASSIUM CHLORIDE 20 MEQ PO PACK
40.0000 meq | PACK | Freq: Once | ORAL | Status: AC
  Administered 2023-02-10: 40 meq via ORAL
  Filled 2023-02-10: qty 2
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
## 2023-02-10 NOTE — Progress Notes (Signed)
Responded to consult for IV. Pt up to restroom.

## 2023-02-10 NOTE — Plan of Care (Signed)
  Problem: Clinical Measurements: Goal: Ability to maintain clinical measurements within normal limits will improve Outcome: Progressing Goal: Diagnostic test results will improve Outcome: Progressing Goal: Respiratory complications will improve Outcome: Progressing   Problem: Coping: Goal: Level of anxiety will decrease Outcome: Progressing   Problem: Pain Managment: Goal: General experience of comfort will improve Outcome: Progressing

## 2023-02-10 NOTE — Progress Notes (Signed)
  Subjective:  POD # 4 SP Tracheostomy and transoral incision and drainage of the parapharyngeal masticator space abscess by Dr. Irene Pap on 02/06/2023.   Today the patients notes that she overall is doing better, she does note her neck and throat pain have improved. Np reported fevers. She feels like her phonating is improving, she denies any significant secretions or neck swelling.   Objective: Vital signs in last 24 hours: Temp:  [98.2 F (36.8 C)-98.6 F (37 C)] 98.4 F (36.9 C) (10/23 0011) Pulse Rate:  [53-66] 53 (10/23 0011) Resp:  [15-18] 16 (10/23 0011) BP: (125-146)/(80-102) 125/84 (10/23 0011) SpO2:  [92 %-100 %] 99 % (10/23 0011) FiO2 (%):  [21 %] 21 % (10/23 0908) Last BM Date : 02/09/23   Physical Exam:  General: Pt resting comfortably in no acute distress  General: Pt resting comfortably in no acute distress   Neck: Supple, trach site is clean with no signs of infection, bleeding, or swelling. 6.0 cuffless Shiley in place, stay sutures in place. Phonating.   Larynx: bulging along the right pharyngeal space  Assessment/Plan:  POD # 4 SP Tracheostomy and transoral incision and drainage of the parapharyngeal masticator space abscess by Dr. Irene Pap on 02/06/2023.   Janina Mayo changed to 6.0 Cuffless Shiley yesterday 02/09/2023  Overall the patient feels like she is improving. Her vital signs have remained stable with WBC relatively stable at 14.7. Her preliminary gram stain shows abundant gram positive cocci, moderate gram  negative rod with culture with few Streptococcus group F, moderate Eikenella Corrodens. On exam she appears well, no appreciable improvement in pharyngeal swelling, no objective worsening.   - Recommend ID consult to help guide antimicrobial management - Continue Trach care and counseling  - We will continue to follow along     Thermon Leyland, PA-C 02/10/2023

## 2023-02-10 NOTE — Progress Notes (Signed)
PROGRESS NOTE        PATIENT DETAILS Name: Cheryl Tran Age: 32 y.o. Sex: female Date of Birth: 1990-12-21 Admit Date: 02/08/2023 Admitting Physician Clydie Braun, MD XBM:[id number], Judeth Cornfield, NP  Brief Summary: Patient is a 32 y.o.  female with history of depression-asthma-who was recently hospitalized from 10/19-10/21 on the PCCM service for right retropharyngeal abscess requiring I&D and tracheotomy-she unfortunately signed out AMA and came back to the hospital for shortness of breath-she was then admitted to the hospitalist service.  See below for further details.  Significant events: 10/19-10/21>> hospitalization for retropharyngeal abscess-s/p tracheotomy/I&D-signed out AMA 10/21>> back to the ED-admit to [facility]  Significant studies: 10/19>> CT neck: Extensive soft tissue infection arising from one of the right sided molar extraction sites with right parapharyngeal/masticator space abscess.  Significant microbiology data: 10/19>> blood culture: No growth 10/19>> retropharyngeal abscess culture: Streptococcus group F, Eikenella  Procedures: None  Consults: ENT Psychiatry PCCM  Subjective: Lying comfortably in bed-denies any chest pain or shortness of breath.  Objective: Vitals: Blood pressure (!) 154/101, pulse 71, temperature 98.3 F (36.8 C), temperature source Oral, resp. rate 13, height 5\' 8"  (1.727 m), weight 93.3 kg, last menstrual period 01/20/2023, SpO2 98%.   Exam: Gen Exam:Alert awake-not in any distress HEENT:atraumatic, normocephalic.  Still with significant bulging in the right pharyngeal/tonsillar area. Chest: B/L clear to auscultation anteriorly CVS:S1S2 regular Abdomen:soft non tender, non distended Extremities:no edema Neurology: Non focal Skin: no rash  Pertinent Labs/Radiology:    Latest Ref Rng & Units 02/10/2023    3:36 AM 02/09/2023    3:33 AM 02/08/2023   10:38 AM  CBC  WBC 4.0 - 10.5 K/uL 14.7  13.8  15.1    Hemoglobin 12.0 - 15.0 g/dL 01.0  27.2  53.6   Hematocrit 36.0 - 46.0 % 36.9  36.6  37.1   Platelets 150 - 400 K/uL 371  355  351     Lab Results  Component Value Date   NA 138 02/10/2023   K 3.4 (L) 02/10/2023   CL 105 02/10/2023   CO2 22 02/10/2023      Assessment/Plan: Upper airway obstruction due to retropharyngeal abscess-s/p I&D and tracheotomy Slowly improving Cultures as above Decadron/Unasyn If no improvement-will discuss with ID RT for tracheotomy education ENT following.  Depression/PTSD IVC-bedside sitter in place Psych following-recommendations are to continue IVC for now Continue Tegretol/Neurontin/Minipress  Bronchial asthma Stable Bronchodilators  Hypokalemia Replete/recheck  Obesity: Estimated body mass index is 31.27 kg/m as calculated from the following:   Height as of this encounter: 5\' 8"  (1.727 m).   Weight as of this encounter: 93.3 kg.   Code status:   Code Status: Full Code   DVT Prophylaxis: enoxaparin (LOVENOX) injection 40 mg Start: 02/08/23 1100   Family Communication: None at bedside   Disposition Plan: Status is: Inpatient Remains inpatient appropriate because: Severity of illness   Planned Discharge Destination:Home   Diet: Diet Order             DIET DYS 3 Room service appropriate? Yes; Fluid consistency: Thin  Diet effective now                     Antimicrobial agents: Anti-infectives (From admission, onward)    Start     Dose/Rate Route Frequency Ordered Stop   02/08/23 1700  Ampicillin-Sulbactam (UNASYN) 3  g in sodium chloride 0.9 % 100 mL IVPB        3 g 200 mL/hr over 30 Minutes Intravenous Every 6 hours 02/08/23 1058     02/08/23 1015  Ampicillin-Sulbactam (UNASYN) 3 g in sodium chloride 0.9 % 100 mL IVPB        3 g 200 mL/hr over 30 Minutes Intravenous  Once 02/08/23 1007 02/08/23 1046        MEDICATIONS: Scheduled Meds:  dexamethasone (DECADRON) injection  4 mg Intravenous Q12H    enoxaparin (LOVENOX) injection  40 mg Subcutaneous Q24H   gabapentin  300 mg Oral TID   guaiFENesin  600 mg Oral BID   Oxcarbazepine  300 mg Oral BID   prazosin  1 mg Oral QHS   sodium chloride flush  3 mL Intravenous Q12H   Continuous Infusions:  ampicillin-sulbactam (UNASYN) IV 3 g (02/10/23 0928)   PRN Meds:.acetaminophen **OR** acetaminophen, albuterol, LORazepam   I have personally reviewed following labs and imaging studies  LABORATORY DATA: CBC: Recent Labs  Lab 02/06/23 0358 02/07/23 1048 02/08/23 1038 02/09/23 0333 02/10/23 0336  WBC 17.2* 18.9* 15.1* 13.8* 14.7*  NEUTROABS 14.6* 17.8* 12.2*  --   --   HGB 13.6 13.6 12.1 12.3 12.6  HCT 41.7 42.1 37.1 36.6 36.9  MCV 85.5 84.5 83.9 81.7 81.6  PLT 315 364 351 355 371    Basic Metabolic Panel: Recent Labs  Lab 02/06/23 0358 02/07/23 1048 02/08/23 1038 02/09/23 0333 02/10/23 0336  NA 137 141 138 137 138  K 3.4* 3.7 3.8 3.7 3.4*  CL 100 106 108 105 105  CO2 22 20* 20* 20* 22  GLUCOSE 117* 96 81 100* 120*  BUN 5* 12 13 12 10   CREATININE 0.82 0.82 0.78 0.62 0.66  CALCIUM 9.3 9.0 8.3* 8.7* 8.7*    GFR: Estimated Creatinine Clearance: 119.5 mL/min (by C-G formula based on SCr of 0.66 mg/dL).  Liver Function Tests: Recent Labs  Lab 02/06/23 0358 02/08/23 1038  AST 17 27  ALT 12 12  ALKPHOS 49 52  BILITOT 0.8 1.7*  PROT 6.9 6.6  ALBUMIN 3.4* 3.1*   No results for input(s): "LIPASE", "AMYLASE" in the last 168 hours. No results for input(s): "AMMONIA" in the last 168 hours.  Coagulation Profile: Recent Labs  Lab 02/06/23 0930  INR 1.2    Cardiac Enzymes: No results for input(s): "CKTOTAL", "CKMB", "CKMBINDEX", "TROPONINI" in the last 168 hours.  BNP (last 3 results) No results for input(s): "PROBNP" in the last 8760 hours.  Lipid Profile: No results for input(s): "CHOL", "HDL", "LDLCALC", "TRIG", "CHOLHDL", "LDLDIRECT" in the last 72 hours.  Thyroid Function Tests: No results for  input(s): "TSH", "T4TOTAL", "FREET4", "T3FREE", "THYROIDAB" in the last 72 hours.  Anemia Panel: Recent Labs    02/09/23 0333  VITAMINB12 310  FOLATE 12.9    Urine analysis: No results found for: "COLORURINE", "APPEARANCEUR", "LABSPEC", "PHURINE", "GLUCOSEU", "HGBUR", "BILIRUBINUR", "KETONESUR", "PROTEINUR", "UROBILINOGEN", "NITRITE", "LEUKOCYTESUR"  Sepsis Labs: Lactic Acid, Venous No results found for: "LATICACIDVEN"  MICROBIOLOGY: Recent Results (from the past 240 hour(s))  Blood culture (routine x 2)     Status: None (Preliminary result)   Collection Time: 02/06/23  9:17 AM   Specimen: BLOOD RIGHT HAND  Result Value Ref Range Status   Specimen Description BLOOD RIGHT HAND  Final   Special Requests   Final    BOTTLES DRAWN AEROBIC ONLY Blood Culture adequate volume   Culture   Final    NO GROWTH  4 DAYS Performed at [facility], [address] [place], [place] [address]    Report Status PENDING  Incomplete  Blood culture (routine x 2)     Status: None (Preliminary result)   Collection Time: 02/06/23  9:22 AM   Specimen: BLOOD RIGHT HAND  Result Value Ref Range Status   Specimen Description BLOOD RIGHT HAND  Final   Special Requests   Final    BOTTLES DRAWN AEROBIC AND ANAEROBIC Blood Culture adequate volume   Culture   Final    NO GROWTH 4 DAYS Performed at [facility], [address] [place], [place] [address]    Report Status PENDING  Incomplete  Fungus Culture With Stain     Status: None (Preliminary result)   Collection Time: 02/06/23 11:23 AM   Specimen: Path fluid; Body Fluid  Result Value Ref Range Status   Fungus Stain Final report  Final    Comment: (NOTE) Performed At: [facility] [address] [place], [place] [id number] Jolene Schimke MD [id number]    Fungus (Mycology) Culture PENDING  Incomplete   Fungal Source ABSCESS  Final    Comment: Performed at [facility], [address], [place], [place] [address]   Aerobic/Anaerobic Culture w Gram Stain (surgical/deep wound)     Status: None (Preliminary result)   Collection Time: 02/06/23 11:23 AM   Specimen: Path fluid; Body Fluid  Result Value Ref Range Status   Specimen Description ABSCESS  Final   Special Requests trans oral  Final   Gram Stain   Final    FEW WBC SEEN ABUNDANT GRAM POSITIVE COCCI MODERATE GRAM NEGATIVE RODS Performed at [facility], [address], [place], [place] [address]    Culture   Final    FEW STREPTOCOCCUS GROUP F Beta hemolytic streptococci are predictably susceptible to penicillin and other beta lactams. Susceptibility testing not routinely performed. MODERATE EIKENELLA CORRODENS Usually susceptible to penicillin and other beta lactam agents,quinolones,macrolides and tetracyclines. NO ANAEROBES ISOLATED; CULTURE IN PROGRESS FOR 5 DAYS    Report Status PENDING  Incomplete  Fungus Culture Result     Status: None   Collection Time: 02/06/23 11:23 AM  Result Value Ref Range Status   Result 1 Comment  Final    Comment: (NOTE) KOH/Calcofluor preparation:  no fungus observed. Performed At: [facility] [address] [place], [place] [id number] Jolene Schimke MD IR:[id number]   MRSA Next Gen by PCR, Nasal     Status: None   Collection Time: 02/06/23 12:58 PM   Specimen: Nasal Mucosa; Nasal Swab  Result Value Ref Range Status   MRSA by PCR Next Gen NOT DETECTED NOT DETECTED Final    Comment: (NOTE) The GeneXpert MRSA Assay (FDA approved for NASAL specimens only), is one component of a comprehensive MRSA colonization surveillance program. It is not intended to diagnose MRSA infection nor to guide or monitor treatment for MRSA infections. Test performance is not FDA approved in patients less than 36 years old. Performed at [facility], [address], [place], [place] [address]     RADIOLOGY STUDIES/RESULTS: No results found.   LOS: 2 days   Jeoffrey Massed, MD  [facility]    To contact the attending provider between 7A-7P or the covering provider during after hours 7P-7A, please log into the web site [email or web address] and access using universal [place] password for that web site. If you do not have the password, please call the  hospital operator.  02/10/2023, 12:48 PM

## 2023-02-10 NOTE — Progress Notes (Signed)
Checked back in with pt to complete IV consult. Nurse on floor stated primary RN and [facility] currently with pt and requested to hold. Agrees to reenter consult when pt is ready.

## 2023-02-10 NOTE — Progress Notes (Signed)
RT provided trach education with Pt and provided trach care handbook. This RT educated Pt on how to clean/maintain site clean and sterile. RT explained importance of inner cannulas and why its important to maintain clean and patent. Pt was educated/ able to perform deep suction by herself with mirror and suctioned secretions out of airway. Pt was also able to change innercannula/gauze. Pt was also educated on what to do if tracheostomy tube was to be dislodged. Pt had no further questions.

## 2023-02-11 DIAGNOSIS — F6089 Other specific personality disorders: Secondary | ICD-10-CM | POA: Diagnosis not present

## 2023-02-11 DIAGNOSIS — F4310 Post-traumatic stress disorder, unspecified: Secondary | ICD-10-CM | POA: Diagnosis not present

## 2023-02-11 DIAGNOSIS — Z930 Tracheostomy status: Secondary | ICD-10-CM | POA: Diagnosis not present

## 2023-02-11 DIAGNOSIS — F3162 Bipolar disorder, current episode mixed, moderate: Secondary | ICD-10-CM | POA: Diagnosis not present

## 2023-02-11 DIAGNOSIS — J390 Retropharyngeal and parapharyngeal abscess: Secondary | ICD-10-CM | POA: Diagnosis not present

## 2023-02-11 DIAGNOSIS — J988 Other specified respiratory disorders: Secondary | ICD-10-CM | POA: Diagnosis not present

## 2023-02-11 LAB — CULTURE, BLOOD (ROUTINE X 2)
Culture: NO GROWTH
Culture: NO GROWTH
Special Requests: ADEQUATE
Special Requests: ADEQUATE

## 2023-02-11 LAB — AEROBIC/ANAEROBIC CULTURE W GRAM STAIN (SURGICAL/DEEP WOUND)

## 2023-02-11 LAB — BASIC METABOLIC PANEL
Anion gap: 10 (ref 5–15)
BUN: 10 mg/dL (ref 6–20)
CO2: 23 mmol/L (ref 22–32)
Calcium: 8.8 mg/dL — ABNORMAL LOW (ref 8.9–10.3)
Chloride: 106 mmol/L (ref 98–111)
Creatinine, Ser: 0.82 mg/dL (ref 0.44–1.00)
GFR, Estimated: >60 mL/min (ref >60)
Glucose, Bld: 114 mg/dL — ABNORMAL HIGH (ref 70–99)
Potassium: 3.5 mmol/L (ref 3.5–5.1)
Sodium: 139 mmol/L (ref 135–145)

## 2023-02-11 LAB — CBC
HCT: 40.9 % (ref 36.0–46.0)
Hemoglobin: 13.6 g/dL (ref 12.0–15.0)
MCH: 27.6 pg (ref 26.0–34.0)
MCHC: 33.3 g/dL (ref 30.0–36.0)
MCV: 83.1 fL (ref 80.0–100.0)
Platelets: 439 10*3/uL — ABNORMAL HIGH (ref 150–400)
RBC: 4.92 MIL/uL (ref 3.87–5.11)
RDW: 13.3 % (ref 11.5–15.5)
WBC: 19.1 10*3/uL — ABNORMAL HIGH (ref 4.0–10.5)
nRBC: 0 % (ref 0.0–0.2)

## 2023-02-11 MED ORDER — SODIUM CHLORIDE 0.9% FLUSH
10.0000 mL | INTRAVENOUS | Status: DC | PRN
Start: 2023-02-11 — End: 2023-02-15

## 2023-02-11 MED ORDER — SODIUM CHLORIDE 0.9% FLUSH
10.0000 mL | Freq: Two times a day (BID) | INTRAVENOUS | Status: DC
  Administered 2023-02-11 – 2023-02-15 (×7): 10 mL

## 2023-02-11 MED ORDER — LORAZEPAM 2 MG/ML PO CONC: 1.0000 mg | Freq: Once | ORAL | Status: DC

## 2023-02-11 MED ORDER — LORAZEPAM 2 MG/ML IJ SOLN
1.0000 mg | Freq: Once | INTRAMUSCULAR | Status: AC
  Administered 2023-02-11: 1 mg via INTRAVENOUS
  Filled 2023-02-11: qty 1

## 2023-02-11 NOTE — TOC Progression Note (Signed)
Transition of Care [facility]) - Progression Note    Patient Details  Name: Cheryl Tran MRN: [id number] Date of Birth: 1990-11-09  [facility] of [facility]) CM/SW Contact  Mearl Latin, LCSW Phone Number: 02/11/2023, 12:28 PM  Clinical Narrative:    TOC continuing to follow.      Barriers to Discharge: Continued Medical Work up, Patient left Against Medical Advice (AMA), Psych Bed not available (new trach and requiring psych placement)  Expected Discharge Plan and Services In-house Referral: Clinical Social Work     Living arrangements for the past 2 months: Apartment                                       Social Determinants of Health (SDOH) Interventions SDOH Screenings   Food Insecurity: No Food Insecurity (02/08/2023)  Housing: Patient Declined (02/08/2023)  Transportation Needs: No Transportation Needs (02/08/2023)  Utilities: Not At Risk (02/08/2023)  Alcohol Screen: Low Risk  (05/12/2022)  Depression (PHQ2-9): High Risk (07/08/2022)  Financial Resource Strain: Medium Risk (05/12/2022)  Physical Activity: Inactive (05/12/2022)  Social Connections: Unknown (10/06/2022)   Received from Ronnald Collum  Stress: Stress Concern Present (05/12/2022)  Tobacco Use: High Risk (02/08/2023)    Readmission Risk Interventions     No data to display

## 2023-02-11 NOTE — Anesthesia Postprocedure Evaluation (Signed)
Anesthesia Post Note  Patient: Kadee Artis Flock  Procedure(s) Performed: FIBEROPTIC INTUBATION WITH TRACHEOSTOMY (Neck) TRANSORAL DRAINAGE ABSCESS (Mouth)     Patient location during evaluation: PACU Anesthesia Type: General Level of consciousness: awake and alert Pain management: pain level controlled Vital Signs Assessment: post-procedure vital signs reviewed and stable Respiratory status: spontaneous breathing, nonlabored ventilation, respiratory function stable and patient connected to nasal cannula oxygen Cardiovascular status: blood pressure returned to baseline and stable Postop Assessment: no apparent nausea or vomiting Anesthetic complications: yes   Encounter Notable Events  Notable Event Outcome Phase Comment  Difficult to intubate - expected  Intraprocedure Filed from anesthesia note documentation.    Last Vitals:  Vitals:   02/07/23 1126 02/07/23 1155  BP:    Pulse: (!) 59   Resp: 20   Temp:  36.8 C  SpO2: 100%     Last Pain:  Vitals:   02/07/23 1155  TempSrc: Oral  PainSc:                  Acasia Skilton S

## 2023-02-11 NOTE — Consult Note (Signed)
[facility]    Date of Admission:  02/08/2023   Total days of inpatient antibiotics 2        Reason for Consult: Parapharyngeal abscess    Principal Problem:   Retropharyngeal abscess Active Problems:   Asthma   PCOS (polycystic ovarian syndrome)   PTSD (post-traumatic stress disorder)   Tracheostomy in place [facility])   Airway obstruction   Suicidal ideations   Tobacco use disorder   Obesity (BMI 30-39.9)   Borderline personality disorder [facility])   Assessment: 32 year old female with PCOS, tobacco abuse, recent admission for paraharyngeal masticator space abscess status post I&D on 10/19 admitted with: #Parapharyngeal masticator space abscess status post tracheostomy and transoral incision and drainage with ENT on 10/19 - Patient was initially on 6 admitted 02/05/09/24 days following wisdom tooth extraction led by difficulty swallowing. - Found to have parapharyngeal, masticator space abscess, retropharyngeal abscess requiring tracheostomy and transoral I&D on 10/19 with ENT - Per OR note purulent drainage noted in pharyngeal wall.  Patient left AMA.  Return with fevers.  Unasyn was restarted, ENT noted repeat CT today to see need for further intervention.  Recommendations:  -Continue Unasyn - Follow-up up on imaging and ENT plans Microbiology:   Antibiotics: Unasyn 10/19-present  Cultures: Blood 10/19 no growth Urine  Other Or cultures on 10/19 group F Streptococcus, moderate toEikenella corroden  HPI: Cheryl Tran is a 32 y.o. female with history of bipolar 1 disorder/depression/anxiety, asthma PCOS, tobacco abuse, obesity, recent hospitalization 10/19-10/21 for Right parapharyngeal, masticator space abscess, simple retropharyngeal abscess: possible odontogenic source  10/19 s/p tracheostomy, transoral I&D of the parapharyngeal masticator space abscess patient was placed on Unasyn but left AMA prior to cultures being final. 10/19 or  findings were relevant for swelling and bulging along right pharyngeal/parapharyngeal space, purulent drainage expressed after right lower molar extraction site was reopened, culture sent. She had left AMA.  At home had fever of 100 and admitted for having thoughts of suicide.  ENT was engaged in his hospitalization, plan for CT and see need for operative intervention. Review of Systems: Review of Systems  All other systems reviewed and are negative.   Past Medical History:  Diagnosis Date   Anxiety    Asthma    Bipolar 1 disorder (HCC)    Depression     Social History   Tobacco Use   Smoking status: Every Day    Types: Cigarettes, Cigars   Smokeless tobacco: Never  Vaping Use   Vaping status: Never Used  Substance Use Topics   Alcohol use: Yes    Comment: socially   Drug use: Yes    Types: Marijuana    Comment: 02/05/23    Family History  Problem Relation Age of Onset   Neuropathy Mother    Addison's disease Mother    Hypertension Mother    Pernicious anemia Mother    Hypertension Father    Diabetes Father    Graves' disease Sister    Bipolar disorder Sister    Breast cancer Maternal Grandmother    Hyperthyroidism Maternal Grandmother    Scheduled Meds:  enoxaparin (LOVENOX) injection  40 mg Subcutaneous Q24H   gabapentin  300 mg Oral TID   guaiFENesin  600 mg Oral BID   Oxcarbazepine  300 mg Oral BID   prazosin  1 mg Oral QHS   sodium chloride flush  3 mL Intravenous Q12H   Continuous Infusions:  ampicillin-sulbactam (UNASYN) IV 3  g (02/11/23 0930)   PRN Meds:.acetaminophen **OR** acetaminophen, albuterol, LORazepam No Known Allergies  OBJECTIVE: Blood pressure (!) 133/97, pulse 66, temperature 98 F (36.7 C), temperature source Oral, resp. rate 20, height 5\' 8"  (1.727 m), weight 93.3 kg, last menstrual period 01/20/2023, SpO2 93%.  Physical Exam Constitutional:      Appearance: Normal appearance.  HENT:     Head: Normocephalic and atraumatic.      Comments: Oral abscess    Right Ear: Tympanic membrane normal.     Left Ear: Tympanic membrane normal.     Nose: Nose normal.     Mouth/Throat:     Mouth: Mucous membranes are moist.  Eyes:     Extraocular Movements: Extraocular movements intact.     Conjunctiva/sclera: Conjunctivae normal.     Pupils: Pupils are equal, round, and reactive to light.  Cardiovascular:     Rate and Rhythm: Normal rate and regular rhythm.     Heart sounds: No murmur heard.    No friction rub. No gallop.  Pulmonary:     Comments: trach Abdominal:     General: Abdomen is flat.     Palpations: Abdomen is soft.  Musculoskeletal:        General: Normal range of motion.  Skin:    General: Skin is warm and dry.  Neurological:     General: No focal deficit present.     Mental Status: She is alert and oriented to person, place, and time.  Psychiatric:        Mood and Affect: Mood normal.     Lab Results Lab Results  Component Value Date   WBC 19.1 (H) 02/11/2023   HGB 13.6 02/11/2023   HCT 40.9 02/11/2023   MCV 83.1 02/11/2023   PLT 439 (H) 02/11/2023    Lab Results  Component Value Date   CREATININE 0.82 02/11/2023   BUN 10 02/11/2023   NA 139 02/11/2023   K 3.5 02/11/2023   CL 106 02/11/2023   CO2 23 02/11/2023    Lab Results  Component Value Date   ALT 12 02/08/2023   AST 27 02/08/2023   ALKPHOS 52 02/08/2023   BILITOT 1.7 (H) 02/08/2023       Danelle Earthly, MD [facility] 02/11/2023, 12:58 PM I have personally spent 82  minutes involved in face-to-face and non-face-to-face activities for this patient on the day of the visit. Professional time spent includes the following activities: Preparing to see the patient (review of tests), Obtaining and/or reviewing separately obtained history (admission/discharge record), Performing a medically appropriate examination and/or evaluation , Ordering medications/tests/procedures, referring  and communicating with other health care professionals, Documenting clinical information in the EMR, Independently interpreting results (not separately reported), Communicating results to the patient/family/caregiver, Counseling and educating the patient/family/caregiver and Care coordination (not separately reported).

## 2023-02-11 NOTE — Progress Notes (Signed)
Pt is alert and oriented X4. Around 2000, visitor is asked to leave as visiting hour is end but said to stayed over night. Both the pt and visitor was made aware of the hospital policy during the day shifts but they started  to make an arguments. Made charge RN aware. Called the security and Nursing supervisor, reinforced the hospital policy again.
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
## 2023-02-11 NOTE — Progress Notes (Addendum)
~  20:30 was notified by primary RN that pt guest (fiance) was refusing to leave despite being reminded of hospital visiting hours. As the oncoming charge RN, I was informed by day shift Charge RN Lulu that visitation rules, IVC policies and procedures were discussed at great length with pt and pt significant other during the day. A print out of IVC policy and list of discussed rules (refer to Dr. Judeth Cornfield Chien's note for rules that were discussed with pt and pt s/o) were also written out and given to pt. Pt states these handwritten rules were not enforceable due to being 'hand written, made up, and did not even have a [place] letterhead on them'. (Handwritten rules were provided d/t pt stating IVC policy print out was too lengthy and she did not wish to read it). Pt significant other still reluctant to leave, so [facility] and security called to beside. After additional discussion and reiteration of hospital policy, along with printed out packets of hospital visitation guidelines, pt significant other still reluctant to leave. As pt and s/o became more disgruntled with enforcement of hospital policy, staff safety was now in question. Pt s/o stated pt is forbidding Consulting civil engineer, primary RN Pooja, and [facility] to re-enter pt room or continue pt care. Security and police officers at bedside to facilitate exiting of pt's significant other in a safe manner. (~23:30)    RN assignment changes were made RT at bedside to evaluate pt per pt request Pt A&O VSS NAD

## 2023-02-11 NOTE — Plan of Care (Addendum)
Resumed care for pt around 2330. Pt has rested quietly throughout the night with no distress noted until 6am. On 6L O2. Trach intact. Pt does own trach care. SR on the monitor. Up to BR to void. RN was in room hanging abx around 6am q when lab came in room and pt was very rude and disrespectful to lab tech. Stated to lab tech that "don't even bother sticking me here (pointing to her [facility]) because there is scar tissue... Just f----- stick me in the hand like I said." That lab tech left and 2 other lab techs were in the hallway outside other rooms and the pt walked to the door and started yelling at one of them telling her to get in there and draw her blood because she wants to go to sleep. 2nd lab tech drew blood.     Problem: Education: Goal: Knowledge of General Education information will improve Description: Including pain rating scale, medication(s)/side effects and non-pharmacologic comfort measures Outcome: Progressing   Problem: Health Behavior/Discharge Planning: Goal: Ability to manage health-related needs will improve Outcome: Progressing   Problem: Clinical Measurements: Goal: Ability to maintain clinical measurements within normal limits will improve Outcome: Progressing Goal: Will remain free from infection Outcome: Progressing Goal: Diagnostic test results will improve Outcome: Progressing Goal: Respiratory complications will improve Outcome: Progressing Goal: Cardiovascular complication will be avoided Outcome: Progressing   Problem: Activity: Goal: Risk for activity intolerance will decrease Outcome: Progressing   Problem: Nutrition: Goal: Adequate nutrition will be maintained Outcome: Progressing   Problem: Coping: Goal: Level of anxiety will decrease Outcome: Progressing   Problem: Elimination: Goal: Will not experience complications related to bowel motility Outcome: Progressing Goal: Will not experience complications related to urinary retention Outcome:  Progressing   Problem: Pain Managment: Goal: General experience of comfort will improve Outcome: Progressing   Problem: Safety: Goal: Ability to remain free from injury will improve Outcome: Progressing   Problem: Skin Integrity: Goal: Risk for impaired skin integrity will decrease Outcome: Progressing   Problem: Education: Goal: Ability to state activities that reduce stress will improve Outcome: Progressing   Problem: Coping: Goal: Ability to identify and develop effective coping behavior will improve Outcome: Progressing   Problem: Self-Concept: Goal: Ability to identify factors that promote anxiety will improve Outcome: Progressing Goal: Level of anxiety will decrease Outcome: Progressing Goal: Ability to modify response to factors that promote anxiety will improve Outcome: Progressing

## 2023-02-11 NOTE — Plan of Care (Signed)
  Problem: Education: Goal: Knowledge of General Education information will improve Description: Including pain rating scale, medication(s)/side effects and non-pharmacologic comfort measures Outcome: Progressing   Problem: Health Behavior/Discharge Planning: Goal: Ability to manage health-related needs will improve Outcome: Progressing   Problem: Clinical Measurements: Goal: Ability to maintain clinical measurements within normal limits will improve Outcome: Progressing Goal: Will remain free from infection Outcome: Progressing Goal: Diagnostic test results will improve Outcome: Progressing Goal: Respiratory complications will improve Outcome: Progressing Goal: Cardiovascular complication will be avoided Outcome: Progressing   Problem: Activity: Goal: Risk for activity intolerance will decrease Outcome: Progressing   Problem: Nutrition: Goal: Adequate nutrition will be maintained Outcome: Progressing   Problem: Coping: Goal: Level of anxiety will decrease Outcome: Progressing   Problem: Elimination: Goal: Will not experience complications related to bowel motility Outcome: Progressing Goal: Will not experience complications related to urinary retention Outcome: Progressing   Problem: Pain Managment: Goal: General experience of comfort will improve Outcome: Progressing   Problem: Safety: Goal: Ability to remain free from injury will improve Outcome: Progressing   Problem: Skin Integrity: Goal: Risk for impaired skin integrity will decrease Outcome: Progressing   Problem: Education: Goal: Ability to state activities that reduce stress will improve Outcome: Progressing   Problem: Coping: Goal: Ability to identify and develop effective coping behavior will improve Outcome: Progressing   Problem: Self-Concept: Goal: Ability to identify factors that promote anxiety will improve Outcome: Progressing Goal: Level of anxiety will decrease Outcome:  Progressing Goal: Ability to modify response to factors that promote anxiety will improve Outcome: Progressing   

## 2023-02-11 NOTE — Progress Notes (Addendum)
  Subjective:  POD # 5 SP Tracheostomy and transoral incision and drainage of the parapharyngeal masticator space abscess by Dr. Irene Pap on 02/06/2023   Pt reports she is doing well today. She notes some ongoing discomfort in the throat but tolerating well. She feels that her speaking is getting better.   Seen by RT yesterday for trach education.    Objective: Vital signs in last 24 hours: Temp:  [98 F (36.7 C)-98.6 F (37 C)] 98 F (36.7 C) (10/24 0815) Pulse Rate:  [60-78] 69 (10/24 0815) Resp:  [13-20] 13 (10/24 0815) BP: (121-168)/(87-109) 168/109 (10/24 0815) SpO2:  [90 %-100 %] 90 % (10/24 0815) FiO2 (%):  [21 %] 21 % (10/24 1017) Last BM Date : 02/09/23   Physical Exam:   General: Pt resting comfortably in no acute distress   Neck: Supple, trach site is clean with no signs of infection, bleeding, or swelling. 6.0 cuffless Shiley in place, stay sutures in place. Phonating well. Area of firm swelling superior to the left clavicle extending to the trapezius, no fluctuance or warmth to touch. Subcutaneous emphysema noted to the right anterior neck extending to approximately 2 cm below. Non tender, no warmth to touch.     Larynx: bulging along the right pharyngeal space    Assessment/Plan: s/p   POD # 5 SP Tracheostomy and transoral incision and drainage of the parapharyngeal masticator space abscess by Dr. Irene Pap on 02/06/2023. Trach changed to 6.0 Cuffless Shiley 02/09/2023  Overall the patient feels like she is doing well, she did develop some swelling to the left clavicular region overnight, it does not appear to be infectious or related to the current infection. The area of concern along the right pharyngeal space does not appear to be worsening but not improving. Today her white count has trended up, currently 19.1, she has remained afebrile. Given her increased WBC along with no significant improvement on exam we will plan for CT neck w contrast. We will plan to  obtain CT tomorrow morning, keep patient NPO after midnight in the event she need operative intervention. Pt care discussed with Dr. Irene Pap. I spoke with both the patient and her mother at patients request; both verbalized understanding and agreement to today's plan with no further questions or concerns.      - Recommend ID consult to help guide antimicrobial management - Continue Trach care and counseling  - SLP consult for speaking valve - We will continue to follow along    Thermon Leyland, PA-C 02/11/2023

## 2023-02-11 NOTE — Progress Notes (Addendum)
PROGRESS NOTE        PATIENT DETAILS Name: Cheryl Tran Age: 32 y.o. Sex: female Date of Birth: 1990/09/15 Admit Date: 02/08/2023 Admitting Physician Clydie Braun, MD ZOX:[id number], Judeth Cornfield, NP  Brief Summary: Patient is a 32 y.o.  female with history of depression-asthma-who was recently hospitalized from 10/19-10/21 on the PCCM service for right retropharyngeal abscess requiring I&D and tracheotomy-she unfortunately signed out AMA and came back to the hospital for shortness of breath-she was then admitted to the hospitalist service.  See below for further details.  Significant events: 10/19-10/21>> hospitalization for retropharyngeal abscess-s/p tracheotomy/I&D-signed out AMA 10/21>> back to the ED-admit to [facility]  Significant studies: 10/19>> CT neck: Extensive soft tissue infection arising from one of the right sided molar extraction sites with right parapharyngeal/masticator space abscess.  Significant microbiology data: 10/19>> blood culture: No growth 10/19>> retropharyngeal abscess culture: Streptococcus group F, Eikenella  Procedures: None  Consults: ENT Psychiatry PCCM  Subjective: Frustrated that she is still in the hospital-otherwise no other issues.  Objective: Vitals: Blood pressure (!) 133/97, pulse 66, temperature 98 F (36.7 C), temperature source Oral, resp. rate 20, height 5\' 8"  (1.727 m), weight 93.3 kg, last menstrual period 01/20/2023, SpO2 93%.   Exam: Gen Exam:Alert awake-not in any distress HEENT:atraumatic, normocephalic.  Continues to have significant bulging with a pus point in the right pharyngeal/tonsillar space.  Trachea in place. Chest: B/L clear to auscultation anteriorly CVS:S1S2 regular Abdomen:soft non tender, non distended Extremities:no edema Neurology: Non focal Skin: no rash  Pertinent Labs/Radiology:    Latest Ref Rng & Units 02/11/2023    6:20 AM 02/10/2023    3:36 AM 02/09/2023    3:33 AM  CBC   WBC 4.0 - 10.5 K/uL 19.1  14.7  13.8   Hemoglobin 12.0 - 15.0 g/dL 09.8  11.9  14.7   Hematocrit 36.0 - 46.0 % 40.9  36.9  36.6   Platelets 150 - 400 K/uL 439  371  355     Lab Results  Component Value Date   NA 139 02/11/2023   K 3.5 02/11/2023   CL 106 02/11/2023   CO2 23 02/11/2023      Assessment/Plan: Upper airway obstruction due to retropharyngeal abscess-s/p I&D and tracheotomy Essentially unchanged-leukocytosis worse but patient on Decadron ENT recommending ID consult Plan is to repeat CT neck tomorrow-if recurrent abscess seen-will require repeat I&D No longer on Decadron Remains on Unasyn ENT following ID formally consulted today.  Slowly improving RT for tracheotomy education ENT following.  Depression/PTSD IVC-bedside sitter in place Psych following-recommendations are to continue IVC for now Continue Tegretol/Neurontin/Minipress  Bronchial asthma Stable Bronchodilators  Hypokalemia Repleted  Obesity: Estimated body mass index is 31.27 kg/m as calculated from the following:   Height as of this encounter: 5\' 8"  (1.727 m).   Weight as of this encounter: 93.3 kg.   Code status:   Code Status: Full Code   DVT Prophylaxis: enoxaparin (LOVENOX) injection 40 mg Start: 02/08/23 1100   Family Communication: None at bedside   Disposition Plan: Status is: Inpatient Remains inpatient appropriate because: Severity of illness   Planned Discharge Destination:Home   Diet: Diet Order             Diet NPO time specified  Diet effective midnight           DIET DYS 3 Room service appropriate?  Yes; Fluid consistency: Thin  Diet effective now                     Antimicrobial agents: Anti-infectives (From admission, onward)    Start     Dose/Rate Route Frequency Ordered Stop   02/08/23 1700  Ampicillin-Sulbactam (UNASYN) 3 g in sodium chloride 0.9 % 100 mL IVPB        3 g 200 mL/hr over 30 Minutes Intravenous Every 6 hours 02/08/23 1058      02/08/23 1015  Ampicillin-Sulbactam (UNASYN) 3 g in sodium chloride 0.9 % 100 mL IVPB        3 g 200 mL/hr over 30 Minutes Intravenous  Once 02/08/23 1007 02/08/23 1046        MEDICATIONS: Scheduled Meds:  dexamethasone (DECADRON) injection  4 mg Intravenous Q12H   enoxaparin (LOVENOX) injection  40 mg Subcutaneous Q24H   gabapentin  300 mg Oral TID   guaiFENesin  600 mg Oral BID   Oxcarbazepine  300 mg Oral BID   prazosin  1 mg Oral QHS   sodium chloride flush  3 mL Intravenous Q12H   Continuous Infusions:  ampicillin-sulbactam (UNASYN) IV 3 g (02/11/23 0930)   PRN Meds:.acetaminophen **OR** acetaminophen, albuterol, LORazepam   I have personally reviewed following labs and imaging studies  LABORATORY DATA: CBC: Recent Labs  Lab 02/06/23 0358 02/07/23 1048 02/08/23 1038 02/09/23 0333 02/10/23 0336 02/11/23 0620  WBC 17.2* 18.9* 15.1* 13.8* 14.7* 19.1*  NEUTROABS 14.6* 17.8* 12.2*  --   --   --   HGB 13.6 13.6 12.1 12.3 12.6 13.6  HCT 41.7 42.1 37.1 36.6 36.9 40.9  MCV 85.5 84.5 83.9 81.7 81.6 83.1  PLT 315 364 351 355 371 439*    Basic Metabolic Panel: Recent Labs  Lab 02/07/23 1048 02/08/23 1038 02/09/23 0333 02/10/23 0336 02/11/23 0620  NA 141 138 137 138 139  K 3.7 3.8 3.7 3.4* 3.5  CL 106 108 105 105 106  CO2 20* 20* 20* 22 23  GLUCOSE 96 81 100* 120* 114*  BUN 12 13 12 10 10   CREATININE 0.82 0.78 0.62 0.66 0.82  CALCIUM 9.0 8.3* 8.7* 8.7* 8.8*    GFR: Estimated Creatinine Clearance: 116.6 mL/min (by C-G formula based on SCr of 0.82 mg/dL).  Liver Function Tests: Recent Labs  Lab 02/06/23 0358 02/08/23 1038  AST 17 27  ALT 12 12  ALKPHOS 49 52  BILITOT 0.8 1.7*  PROT 6.9 6.6  ALBUMIN 3.4* 3.1*   No results for input(s): "LIPASE", "AMYLASE" in the last 168 hours. No results for input(s): "AMMONIA" in the last 168 hours.  Coagulation Profile: Recent Labs  Lab 02/06/23 0930  INR 1.2    Cardiac Enzymes: No results for input(s):  "CKTOTAL", "CKMB", "CKMBINDEX", "TROPONINI" in the last 168 hours.  BNP (last 3 results) No results for input(s): "PROBNP" in the last 8760 hours.  Lipid Profile: No results for input(s): "CHOL", "HDL", "LDLCALC", "TRIG", "CHOLHDL", "LDLDIRECT" in the last 72 hours.  Thyroid Function Tests: No results for input(s): "TSH", "T4TOTAL", "FREET4", "T3FREE", "THYROIDAB" in the last 72 hours.  Anemia Panel: Recent Labs    02/09/23 0333  VITAMINB12 310  FOLATE 12.9    Urine analysis: No results found for: "COLORURINE", "APPEARANCEUR", "LABSPEC", "PHURINE", "GLUCOSEU", "HGBUR", "BILIRUBINUR", "KETONESUR", "PROTEINUR", "UROBILINOGEN", "NITRITE", "LEUKOCYTESUR"  Sepsis Labs: Lactic Acid, Venous No results found for: "LATICACIDVEN"  MICROBIOLOGY: Recent Results (from the past 240 hour(s))  Blood culture (routine x 2)  Status: None   Collection Time: 02/06/23  9:17 AM   Specimen: BLOOD RIGHT HAND  Result Value Ref Range Status   Specimen Description BLOOD RIGHT HAND  Final   Special Requests   Final    BOTTLES DRAWN AEROBIC ONLY Blood Culture adequate volume   Culture   Final    NO GROWTH 5 DAYS Performed at [facility], [address] [place], [place] [address]    Report Status 02/11/2023 FINAL  Final  Blood culture (routine x 2)     Status: None   Collection Time: 02/06/23  9:22 AM   Specimen: BLOOD RIGHT HAND  Result Value Ref Range Status   Specimen Description BLOOD RIGHT HAND  Final   Special Requests   Final    BOTTLES DRAWN AEROBIC AND ANAEROBIC Blood Culture adequate volume   Culture   Final    NO GROWTH 5 DAYS Performed at [facility], [address] [place], [place] [address]    Report Status 02/11/2023 FINAL  Final  Fungus Culture With Stain     Status: None (Preliminary result)   Collection Time: 02/06/23 11:23 AM   Specimen: Path fluid; Body Fluid  Result Value Ref Range Status   Fungus Stain Final report  Final    Comment:  (NOTE) Performed At: [facility] [address] [place], [place] [id number] Jolene Schimke MD WG:[id number]    Fungus (Mycology) Culture PENDING  Incomplete   Fungal Source ABSCESS  Final    Comment: Performed at [facility], [address] [place], [place] [address]  Aerobic/Anaerobic Culture w Gram Stain (surgical/deep wound)     Status: None (Preliminary result)   Collection Time: 02/06/23 11:23 AM   Specimen: Path fluid; Body Fluid  Result Value Ref Range Status   Specimen Description ABSCESS  Final   Special Requests trans oral  Final   Gram Stain   Final    FEW WBC SEEN ABUNDANT GRAM POSITIVE COCCI MODERATE GRAM NEGATIVE RODS Performed at [facility], [address] [place], [place] [address]    Culture   Final    FEW STREPTOCOCCUS GROUP F Beta hemolytic streptococci are predictably susceptible to penicillin and other beta lactams. Susceptibility testing not routinely performed. MODERATE EIKENELLA CORRODENS Usually susceptible to penicillin and other beta lactam agents,quinolones,macrolides and tetracyclines. NO ANAEROBES ISOLATED; CULTURE IN PROGRESS FOR 5 DAYS    Report Status PENDING  Incomplete  Fungus Culture Result     Status: None   Collection Time: 02/06/23 11:23 AM  Result Value Ref Range Status   Result 1 Comment  Final    Comment: (NOTE) KOH/Calcofluor preparation:  no fungus observed. Performed At: [facility] [address] [place], [place] [id number] Jolene Schimke MD UU:[id number]   MRSA Next Gen by PCR, Nasal     Status: None   Collection Time: 02/06/23 12:58 PM   Specimen: Nasal Mucosa; Nasal Swab  Result Value Ref Range Status   MRSA by PCR Next Gen NOT DETECTED NOT DETECTED Final    Comment: (NOTE) The GeneXpert MRSA Assay (FDA approved for NASAL specimens only), is one component of a comprehensive MRSA colonization surveillance program. It is not intended to diagnose MRSA infection nor to guide or monitor  treatment for MRSA infections. Test performance is not FDA approved in patients less than 65 years old. Performed at [facility], [address], [place], [place] [address]     RADIOLOGY STUDIES/RESULTS: No results  found.   LOS: 3 days   Jeoffrey Massed, MD  [facility]    To contact the attending provider between 7A-7P or the covering provider during after hours 7P-7A, please log into the web site [email or web address] and access using universal [place] password for that web site. If you do not have the password, please call the hospital operator.  02/11/2023, 11:47 AM

## 2023-02-11 NOTE — Progress Notes (Signed)
A midline has been ordered for this patient. The VAST RN has reviewed the patient's medical record including any arm restrictions, current creatinine clearance, length IV therapy is needed, and infusions needed/ordered to determine if a midline is the appropriate line for this individual patient. If there are contraindications, the physician and primary RN has been contacted by VAST RN for further discussion. °Midline Education: °a midline is a long peripheral IV placed in the upper arm with the tip located at or near the axilla and distal to the shoulder °should not be used as a CLABSI preventative measure   °it has one lumen only °it can remain in place for up to 29 days  °Safe for Vancomycin infusion LESS THAN 6 days °Is safe for power injection if good blood return can be obtained and line flushes easily °it CANNOT be used for continuous infusion of vesicants. Including TPN and chemotherapy °Should NOT be placed for sole intent of obtaining labs as there is no guarantee blood can be successfully drawn from line  °contraindicated in patients with thrombosis, hypercoagulability, decreased venous flow to the extremities, ESRD (without a nephrologist's approval), small vessels, allergy to polyurethane, or known/suspected presence of a device-related infection, bacteremia, or septicemia  °

## 2023-02-12 ENCOUNTER — Inpatient Hospital Stay (HOSPITAL_COMMUNITY): Payer: MEDICAID

## 2023-02-12 ENCOUNTER — Inpatient Hospital Stay (HOSPITAL_COMMUNITY): Payer: MEDICAID | Admitting: Anesthesiology

## 2023-02-12 ENCOUNTER — Encounter (HOSPITAL_COMMUNITY)
Admission: EM | Disposition: A | Discharge status: Home / Self Care | Payer: Self-pay | Source: Home / Self Care | Attending: Internal Medicine

## 2023-02-12 ENCOUNTER — Encounter (HOSPITAL_COMMUNITY): Payer: Self-pay | Admitting: Internal Medicine

## 2023-02-12 DIAGNOSIS — J391 Other abscess of pharynx: Secondary | ICD-10-CM

## 2023-02-12 DIAGNOSIS — J988 Other specified respiratory disorders: Secondary | ICD-10-CM | POA: Diagnosis not present

## 2023-02-12 DIAGNOSIS — F3162 Bipolar disorder, current episode mixed, moderate: Secondary | ICD-10-CM | POA: Diagnosis not present

## 2023-02-12 DIAGNOSIS — Z930 Tracheostomy status: Secondary | ICD-10-CM | POA: Diagnosis not present

## 2023-02-12 DIAGNOSIS — J390 Retropharyngeal and parapharyngeal abscess: Secondary | ICD-10-CM | POA: Diagnosis not present

## 2023-02-12 HISTORY — PX: INCISION AND DRAINAGE ABSCESS: SHX5864

## 2023-02-12 HISTORY — DX: Other abscess of pharynx: J39.1

## 2023-02-12 LAB — CBC WITH DIFFERENTIAL/PLATELET
Abs Immature Granulocytes: 0.17 10*3/uL — ABNORMAL HIGH (ref 0.00–0.07)
Basophils Absolute: 0.1 10*3/uL (ref 0.0–0.1)
Basophils Relative: 0 %
Eosinophils Absolute: 0.3 10*3/uL (ref 0.0–0.5)
Eosinophils Relative: 1 %
HCT: 37.6 % (ref 36.0–46.0)
Hemoglobin: 12.7 g/dL (ref 12.0–15.0)
Immature Granulocytes: 1 %
Lymphocytes Relative: 18 %
Lymphs Abs: 3.5 10*3/uL (ref 0.7–4.0)
MCH: 27.9 pg (ref 26.0–34.0)
MCHC: 33.8 g/dL (ref 30.0–36.0)
MCV: 82.5 fL (ref 80.0–100.0)
Monocytes Absolute: 1.1 10*3/uL — ABNORMAL HIGH (ref 0.1–1.0)
Monocytes Relative: 6 %
Neutro Abs: 14.6 10*3/uL — ABNORMAL HIGH (ref 1.7–7.7)
Neutrophils Relative %: 74 %
Platelets: 416 10*3/uL — ABNORMAL HIGH (ref 150–400)
RBC: 4.56 MIL/uL (ref 3.87–5.11)
RDW: 13.4 % (ref 11.5–15.5)
WBC: 19.7 10*3/uL — ABNORMAL HIGH (ref 4.0–10.5)
nRBC: 0 % (ref 0.0–0.2)

## 2023-02-12 SURGERY — INCISION AND DRAINAGE, ABSCESS
Anesthesia: General

## 2023-02-12 MED ORDER — CHLORHEXIDINE GLUCONATE 0.12 % MT SOLN
15.0000 mL | Freq: Two times a day (BID) | OROMUCOSAL | Status: DC
  Administered 2023-02-12 – 2023-02-15 (×6): 15 mL via OROMUCOSAL
  Filled 2023-02-12: qty 15

## 2023-02-12 MED ORDER — DEXAMETHASONE SODIUM PHOSPHATE 10 MG/ML IJ SOLN
INTRAMUSCULAR | Status: DC | PRN
  Administered 2023-02-12: 5 mg via INTRAVENOUS

## 2023-02-12 MED ORDER — EPINEPHRINE HCL (NASAL) 0.1 % NA SOLN
NASAL | Status: AC
  Filled 2023-02-12: qty 30

## 2023-02-12 MED ORDER — DEXAMETHASONE SODIUM PHOSPHATE 10 MG/ML IJ SOLN
INTRAMUSCULAR | Status: AC
  Filled 2023-02-12: qty 1

## 2023-02-12 MED ORDER — ORAL CARE MOUTH RINSE: 15.0000 mL | Freq: Once | OROMUCOSAL | Status: AC

## 2023-02-12 MED ORDER — SUGAMMADEX SODIUM 200 MG/2ML IV SOLN
INTRAVENOUS | Status: DC | PRN
  Administered 2023-02-12: 200 mg via INTRAVENOUS
  Administered 2023-02-12: 50 mg via INTRAVENOUS

## 2023-02-12 MED ORDER — ONDANSETRON HCL 4 MG/2ML IJ SOLN
INTRAMUSCULAR | Status: AC
Start: 2023-02-12 — End: ?
  Filled 2023-02-12: qty 2

## 2023-02-12 MED ORDER — ACETAMINOPHEN 10 MG/ML IV SOLN
INTRAVENOUS | Status: AC
  Filled 2023-02-12: qty 100

## 2023-02-12 MED ORDER — FENTANYL CITRATE (PF) 250 MCG/5ML IJ SOLN
INTRAMUSCULAR | Status: AC
  Filled 2023-02-12: qty 5

## 2023-02-12 MED ORDER — OXYCODONE HCL 5 MG/5ML PO SOLN
ORAL | Status: AC
  Filled 2023-02-12: qty 5

## 2023-02-12 MED ORDER — ACETAMINOPHEN 10 MG/ML IV SOLN
1000.0000 mg | Freq: Once | INTRAVENOUS | Status: DC | PRN
  Administered 2023-02-12: 1000 mg via INTRAVENOUS

## 2023-02-12 MED ORDER — FENTANYL CITRATE (PF) 100 MCG/2ML IJ SOLN
INTRAMUSCULAR | Status: AC
  Filled 2023-02-12: qty 2

## 2023-02-12 MED ORDER — CHLORHEXIDINE GLUCONATE 0.12 % MT SOLN
15.0000 mL | Freq: Once | OROMUCOSAL | Status: AC
  Administered 2023-02-12: 15 mL via OROMUCOSAL

## 2023-02-12 MED ORDER — PROPOFOL 10 MG/ML IV BOLUS
INTRAVENOUS | Status: DC | PRN
  Administered 2023-02-12: 150 mg via INTRAVENOUS

## 2023-02-12 MED ORDER — OXYCODONE HCL 5 MG/5ML PO SOLN
5.0000 mg | Freq: Once | ORAL | Status: AC | PRN
  Administered 2023-02-12: 5 mg via ORAL

## 2023-02-12 MED ORDER — IOHEXOL 350 MG/ML SOLN
75.0000 mL | Freq: Once | INTRAVENOUS | Status: AC | PRN
  Administered 2023-02-12: 75 mL via INTRAVENOUS

## 2023-02-12 MED ORDER — LIDOCAINE-EPINEPHRINE 1 %-1:100000 IJ SOLN
INTRAMUSCULAR | Status: AC
Start: 2023-02-12 — End: ?
  Filled 2023-02-12: qty 1

## 2023-02-12 MED ORDER — ONDANSETRON HCL 4 MG/2ML IJ SOLN
INTRAMUSCULAR | Status: DC | PRN
  Administered 2023-02-12: 4 mg via INTRAVENOUS

## 2023-02-12 MED ORDER — OXYCODONE HCL 5 MG PO TABS: 5.0000 mg | ORAL_TABLET | Freq: Once | ORAL | Status: AC | PRN

## 2023-02-12 MED ORDER — ROCURONIUM BROMIDE 10 MG/ML (PF) SYRINGE
PREFILLED_SYRINGE | INTRAVENOUS | Status: DC | PRN
  Administered 2023-02-12: 60 mg via INTRAVENOUS

## 2023-02-12 MED ORDER — LIDOCAINE 2% (20 MG/ML) 5 ML SYRINGE
INTRAMUSCULAR | Status: AC
  Filled 2023-02-12: qty 5

## 2023-02-12 MED ORDER — FENTANYL CITRATE (PF) 250 MCG/5ML IJ SOLN
INTRAMUSCULAR | Status: DC | PRN
  Administered 2023-02-12: 50 ug via INTRAVENOUS

## 2023-02-12 MED ORDER — ONDANSETRON HCL 4 MG/2ML IJ SOLN: 4.0000 mg | Freq: Once | INTRAMUSCULAR | Status: DC | PRN

## 2023-02-12 MED ORDER — ROCURONIUM BROMIDE 10 MG/ML (PF) SYRINGE
PREFILLED_SYRINGE | INTRAVENOUS | Status: AC
  Filled 2023-02-12: qty 10

## 2023-02-12 MED ORDER — LIDOCAINE 2% (20 MG/ML) 5 ML SYRINGE
INTRAMUSCULAR | Status: DC | PRN
  Administered 2023-02-12: 80 mg via INTRAVENOUS

## 2023-02-12 MED ORDER — PROPOFOL 10 MG/ML IV BOLUS
INTRAVENOUS | Status: AC
  Filled 2023-02-12: qty 20

## 2023-02-12 MED ORDER — FENTANYL CITRATE (PF) 100 MCG/2ML IJ SOLN
25.0000 ug | INTRAMUSCULAR | Status: DC | PRN
  Administered 2023-02-12: 50 ug via INTRAVENOUS

## 2023-02-12 MED ORDER — LACTATED RINGERS IV SOLN: INTRAVENOUS | Status: DC | PRN

## 2023-02-12 MED ORDER — OXYMETAZOLINE HCL 0.05 % NA SOLN
NASAL | Status: AC
Start: 2023-02-12 — End: ?
  Filled 2023-02-12: qty 30

## 2023-02-12 SURGICAL SUPPLY — 35 items
BAG COUNTER SPONGE SURGICOUNT (BAG) ×1 IMPLANT
BAG SPNG CNTER NS LX DISP (BAG) ×1
CANISTER SUCT 3000ML PPV (MISCELLANEOUS) ×1 IMPLANT
CATH ROBINSON RED A/P 10FR (CATHETERS) ×1 IMPLANT
CLEANER TIP ELECTROSURG 2X2 (MISCELLANEOUS) ×1 IMPLANT
COAGULATOR SUCT SWTCH 10FR 6 (ELECTROSURGICAL) ×1 IMPLANT
DRAPE HALF SHEET 40X57 (DRAPES) IMPLANT
ELECT COATED BLADE 2.86 ST (ELECTRODE) ×1 IMPLANT
ELECT REM PT RETURN 9FT ADLT (ELECTROSURGICAL)
ELECT REM PT RETURN 9FT PED (ELECTROSURGICAL)
ELECTRODE REM PT RETRN 9FT PED (ELECTROSURGICAL) IMPLANT
ELECTRODE REM PT RTRN 9FT ADLT (ELECTROSURGICAL) IMPLANT
GAUZE 4X4 16PLY ~~LOC~~+RFID DBL (SPONGE) ×1 IMPLANT
GLOVE SS BIOGEL STRL SZ 7.5 (GLOVE) ×1 IMPLANT
GOWN STRL REUS W/ TWL LRG LVL3 (GOWN DISPOSABLE) ×2 IMPLANT
GOWN STRL REUS W/TWL LRG LVL3 (GOWN DISPOSABLE) ×2
KIT BASIN OR (CUSTOM PROCEDURE TRAY) ×1 IMPLANT
KIT TURNOVER KIT B (KITS) ×1 IMPLANT
NDL HYPO 25GX1X1/2 BEV (NEEDLE) IMPLANT
NEEDLE HYPO 25GX1X1/2 BEV (NEEDLE) IMPLANT
NS IRRIG 1000ML POUR BTL (IV SOLUTION) ×1 IMPLANT
PACK BASIC III (CUSTOM PROCEDURE TRAY) ×1
PACK SRG BSC III STRL LF ECLPS (CUSTOM PROCEDURE TRAY) ×1 IMPLANT
PAD ARMBOARD 7.5X6 YLW CONV (MISCELLANEOUS) ×2 IMPLANT
PENCIL FOOT CONTROL (ELECTRODE) ×1 IMPLANT
POSITIONER HEAD DONUT 9IN (MISCELLANEOUS) IMPLANT
SPECIMEN JAR SMALL (MISCELLANEOUS) IMPLANT
SPONGE TONSIL 1 RF SGL (DISPOSABLE) IMPLANT
SYR BULB EAR ULCER 3OZ GRN STR (SYRINGE) ×1 IMPLANT
TOWEL GREEN STERILE FF (TOWEL DISPOSABLE) ×2 IMPLANT
TUBE CONNECTING 12X1/4 (SUCTIONS) ×1 IMPLANT
TUBE SALEM SUMP 10F (TUBING) IMPLANT
TUBE SALEM SUMP 12F (TUBING) ×1 IMPLANT
TUBE SALEM SUMP 16F (TUBING) IMPLANT
WATER STERILE IRR 1000ML POUR (IV SOLUTION) ×1 IMPLANT

## 2023-02-12 NOTE — Op Note (Signed)
Otolaryngology Operative note  Cheryl Tran Date/Time of Admission: 02/08/2023  9:41 AM  CSN: [id number];MRN:[id number]  DOB: Sep 24, 1990 Age: 32 y.o. Location: [facility] OR   Pre-Op Diagnosis: Recurrent parapharyngeal abscess  Post-Op Diagnosis: Same  Procedure: INCISION AND Re-DRAINAGE Transoral of Parapharyngeal and masticator space abscess 2.   Tracheostomy tube change  Surgeon: Jovita Kussmaul, MD  Anesthesia type:  General  Anesthesiologist: Anesthesiologist: Mariann Barter, MD CRNA: Sharyn Dross, CRNA   Staff: Circulator: Lennox Laity, RN Scrub Person: Virgel Bouquet, RN  Specimens: ID Type Source Tests Collected by Time Destination  1 : Left Peritonsilar Tissue PATH ENT biopsy SURGICAL PATHOLOGY, AEROBIC/ANAEROBIC CULTURE W GRAM STAIN (SURGICAL/DEEP WOUND) Read Drivers, MD 02/12/2023 1637     EBL: 50cc  Drains: 1/4 inch penrose sutured intraorally  Post-op disposition and condition: PACU, hemodynamically stable   Findings: - Purulent fluid from existing gingival incision and through peritonsillar incision - cultured; copiously irrigated and 1/4 inch penrose placed through the gingival incision and brought out through the peritonsillar incision. Secured with two 2-0 silk sutures - Maturing tracheostomy tract - replaced with 6-0 uncuffed trach at end of procedure  Complications: None apparent  Indications and consent:  Cheryl Tran is a 32 y.o. female with recurrent parapharyngeal and masticator space abscesses which recollected after prior drainage. The patient's options were discussed, including risks/benefits/alternatives for each option. Patient expressed understanding, and despite these risks, consented and decided to proceed with above procedures. Informed consent was signed before proceeding. Mother was also informed per patient wish  Procedure: After discussing risks, benefits, and alternatives, the patient was brought to the operative suite and placed on the  operative table in the supine position.  Anesthesia was induced and existing tracheostomy changed to a 6-0 cuffed ETT.  The patient was positioned and prepped in usual fashion and head of bed was turned 90 degrees.  We then turned our attention to the abscess. A Crow-Davis retractor was placed in the mouth and suspended. There was evidence of edema/swelling along the uvula and soft palate, and bulging along the right pharyngeal wall behind the tonsil. The prior peritonsillar and gingival incisions were opened and copious amount of purulent fluid was encountered and cultured. Any loculations were broken up using a hemostat and wound cavity explored until no additional purulence identified. The incisions were connected. The wound was then copiously irrigated using saline with a red rubber catheter until clear. A 1/4 inch penrose was placed through the gingival incision and brought out through the peritonsillar incision and secured with 2-0 silk sutures. The oral cavity and oropharynx were again irrigated and suctioned. Hemostasis was adequate. A flexible suction was used to suction out gastric contents. All instruments were then removed. The patient was then turned over to anesthesia and moved to PACU in stable condition.  All counts were correct.   Plan: - Continue IV antibiotics, per ID and primary team - will follow up culture results - Will continue to keep trach in place until improvement. Ok for PMV - Trend CBC - Will obtain CXR in pacu to make sure no pneumothorax - due to air in neck on pre-op CT - Ok for diet - soft diet - Peridex mouthwash BID x7d with good oral care - ENT will follow

## 2023-02-12 NOTE — Plan of Care (Signed)
  Problem: Education: Goal: Knowledge of General Education information will improve Description: Including pain rating scale, medication(s)/side effects and non-pharmacologic comfort measures Outcome: Progressing   Problem: Health Behavior/Discharge Planning: Goal: Ability to manage health-related needs will improve Outcome: Progressing   Problem: Clinical Measurements: Goal: Ability to maintain clinical measurements within normal limits will improve Outcome: Progressing Goal: Will remain free from infection Outcome: Progressing Goal: Diagnostic test results will improve Outcome: Progressing Goal: Respiratory complications will improve Outcome: Progressing Goal: Cardiovascular complication will be avoided Outcome: Progressing   Problem: Activity: Goal: Risk for activity intolerance will decrease Outcome: Progressing   Problem: Nutrition: Goal: Adequate nutrition will be maintained Outcome: Progressing   Problem: Coping: Goal: Level of anxiety will decrease Outcome: Progressing   Problem: Elimination: Goal: Will not experience complications related to bowel motility Outcome: Progressing Goal: Will not experience complications related to urinary retention Outcome: Progressing   Problem: Pain Managment: Goal: General experience of comfort will improve Outcome: Progressing   Problem: Safety: Goal: Ability to remain free from injury will improve Outcome: Progressing   Problem: Skin Integrity: Goal: Risk for impaired skin integrity will decrease Outcome: Progressing   Problem: Education: Goal: Ability to state activities that reduce stress will improve Outcome: Progressing   Problem: Coping: Goal: Ability to identify and develop effective coping behavior will improve Outcome: Progressing   Problem: Self-Concept: Goal: Ability to identify factors that promote anxiety will improve Outcome: Progressing Goal: Level of anxiety will decrease Outcome:  Progressing Goal: Ability to modify response to factors that promote anxiety will improve Outcome: Progressing   

## 2023-02-12 NOTE — Progress Notes (Signed)
  Subjective:  POD # 6 SP Tracheostomy and transoral incision and drainage of the parapharyngeal masticator space abscess by Dr. Irene Pap on 02/06/2023   The patient notes improvement in throat pain today. She denies fever, speaking well.  Objective: Vital signs in last 24 hours: Pulse Rate:  [58-94] 61 (10/25 1221) Resp:  [13-20] 13 (10/25 1221) BP: (105-156)/(70-101) 156/93 (10/25 1221) SpO2:  [95 %-100 %] 96 % (10/25 1221) FiO2 (%):  [21 %] 21 % (10/25 1221) Last BM Date : 02/09/23   Physical Exam:  General: Pt resting comfortably in no acute distress Neck: Supple, trach site is clean with no signs of infection, bleeding, or swelling. 6.0 cuffless Shiley in place, stay sutures in place. Phonating well. Subcutaneous emphysema noted to the right anterior neck extending to approximately 2 cm below. Non tender, no warmth to touch.    Larynx: bulging along the right pharyngeal space     Assessment/Plan: s/p Procedure(s): INCISION AND DRAINAGE PHARYNGEAL ABSCESS  POD # 5 SP Tracheostomy and transoral incision and drainage of the parapharyngeal masticator space abscess by Dr. Irene Pap on 02/06/2023. Trach changed to 6.0 Cuffless Shiley 02/09/2023   The patient was seen and evaluated today in conjunction with Dr. Allena Katz. Her WBC has continued to trend up, today 19.7. She has remained afebrile and appears non toxic. CT completed shows persistent fluid collection located along the right peritonsillar space. Given the persistent fluid collection in conjunction with elevated WBC, Dr Allena Katz will plan to take the patient to the OR for incision and drainage of pharyngeal abscess. I also discussed placement of drain. The patient has been NPO since midnight. Risks and benefits were discussed with the patient, she would like to proceed with surgery. I also discussed surgery with her mother at there request. Both verbalized understanding and agreement to today's plan and had no further questions.      Kelle Darting Denys Labree, PA-C 02/12/2023

## 2023-02-12 NOTE — Anesthesia Preprocedure Evaluation (Signed)
Anesthesia Evaluation  Patient identified by MRN, date of birth, ID band Patient awake    Reviewed: Allergy & Precautions, NPO status , Patient's Chart, lab work & pertinent test results, reviewed documented beta blocker date and time   History of Anesthesia Complications Negative for: history of anesthetic complications  Airway Mallampati: Trach  TM Distance: >3 FB Neck ROM: Full    Dental no notable dental hx.    Pulmonary asthma , Current Smoker and Patient abstained from smoking.   breath sounds clear to auscultation       Cardiovascular (-) hypertension(-) angina (-) CAD, (-) Past MI and (-) Cardiac Stents  Rhythm:Regular Rate:Normal     Neuro/Psych neg Seizures PSYCHIATRIC DISORDERS Anxiety Depression Bipolar Disorder      GI/Hepatic ,neg GERD  ,,(+) neg Cirrhosis        Endo/Other    Renal/GU      Musculoskeletal   Abdominal   Peds  Hematology   Anesthesia Other Findings   Reproductive/Obstetrics                             Anesthesia Physical Anesthesia Plan  ASA: 2  Anesthesia Plan: General   Post-op Pain Management:    Induction: Intravenous  PONV Risk Score and Plan: 1  Airway Management Planned: Tracheostomy  Additional Equipment:   Intra-op Plan:   Post-operative Plan: Extubation in OR  Informed Consent: I have reviewed the patients History and Physical, chart, labs and discussed the procedure including the risks, benefits and alternatives for the proposed anesthesia with the patient or authorized representative who has indicated his/her understanding and acceptance.     Dental advisory given  Plan Discussed with: CRNA  Anesthesia Plan Comments:        Anesthesia Quick Evaluation

## 2023-02-12 NOTE — Transfer of Care (Signed)
Immediate Anesthesia Transfer of Care Note  Patient: Cheryl Tran  Procedure(s) Performed: INCISION AND DRAINAGE PHARYNGEAL ABSCESS  Patient Location: PACU  Anesthesia Type:General  Level of Consciousness: awake, alert , and oriented  Airway & Oxygen Therapy: Patient Spontanous Breathing  Post-op Assessment: Report given to RN and Post -op Vital signs reviewed and stable  Post vital signs: Reviewed and stable  Last Vitals:  Vitals Value Taken Time  BP 154/92 02/12/23 1745  Temp    Pulse 68 02/12/23 1748  Resp 15 02/12/23 1748  SpO2 100 % 02/12/23 1748  Vitals shown include unfiled device data.  Last Pain:  Vitals:   02/12/23 1616  TempSrc:   PainSc: 0-No pain      Patients Stated Pain Goal: 0 (02/09/23 2200)  Complications: There were no known notable events for this encounter.

## 2023-02-12 NOTE — Consult Note (Addendum)
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  Allergies: No Known Allergies

## 2023-02-12 NOTE — Progress Notes (Signed)
PROGRESS NOTE        PATIENT DETAILS Name: Cheryl Tran Age: 32 y.o. Sex: female Date of Birth: October 18, 1990 Admit Date: 02/08/2023 Admitting Physician Clydie Braun, MD WJX:[id number], Judeth Cornfield, NP  Brief Summary: Patient is a 32 y.o.  female with history of depression-asthma-who was recently hospitalized from 10/19-10/21 on the PCCM service for right retropharyngeal abscess requiring I&D and tracheotomy-she unfortunately signed out AMA and came back to the hospital for shortness of breath-she was then admitted to the hospitalist service.  See below for further details.  Significant events: 10/19-10/21>> hospitalization for retropharyngeal abscess-s/p tracheotomy/I&D-signed out AMA 10/21>> back to the ED-admit to [facility]  Significant studies: 10/19>> CT neck: Extensive soft tissue infection arising from one of the right sided molar extraction sites with right parapharyngeal/masticator space abscess.  Significant microbiology data: 10/19>> blood culture: No growth 10/19>> retropharyngeal abscess culture: Streptococcus group F, Eikenella  Procedures: None  Consults: ENT Psychiatry PCCM  Subjective: Frustrated about numerous issues-claims she was not treated well last night.  She actually feels better medically-does not have any complaints.  Her throat swelling seems to be better on exam.  Objective: Vitals: Blood pressure (!) 118/92, pulse 77, temperature 98 F (36.7 C), temperature source Oral, resp. rate 18, height 5\' 8"  (1.727 m), weight 93.3 kg, last menstrual period 01/20/2023, SpO2 100%.   Exam: Gen Exam:Alert awake-not in any distress HEENT:atraumatic, normocephalic Throat: Some bulging in the right tonsillar/parapharyngeal area-but much better compared to the past several days. Abdomen:soft non tender, non distended Extremities:no edema Neurology: Non focal Skin: no rash  Pertinent Labs/Radiology:    Latest Ref Rng & Units 02/12/2023     3:54 AM 02/11/2023    6:20 AM 02/10/2023    3:36 AM  CBC  WBC 4.0 - 10.5 K/uL 19.7  19.1  14.7   Hemoglobin 12.0 - 15.0 g/dL 56.2  13.0  86.5   Hematocrit 36.0 - 46.0 % 37.6  40.9  36.9   Platelets 150 - 400 K/uL 416  439  371     Lab Results  Component Value Date   NA 139 02/11/2023   K 3.5 02/11/2023   CL 106 02/11/2023   CO2 23 02/11/2023      Assessment/Plan: Upper airway obstruction due to retropharyngeal abscess-s/p I&D and tracheotomy Suspect some clinical improvement-less swelling in the right pharyngeal area today  Afebrile-nontoxic-appearing-leukocytosis unchanged-suspect this is mostly from Decadron at this point Awaiting repeat CT today-if abscess present-ENT will drain otherwise plan is to start discharge planning.   Continue Unasyn ID/ENT following Continue trach education-SLP consulted today  Depression/PTSD IVC-bedside sitter in place Psych following-recommendations are to continue IVC for now Continue Tegretol/Neurontin/Minipress  Bronchial asthma Stable Bronchodilators  Hypokalemia Repleted  Obesity: Estimated body mass index is 31.27 kg/m as calculated from the following:   Height as of this encounter: 5\' 8"  (1.727 m).   Weight as of this encounter: 93.3 kg.   Code status:   Code Status: Full Code   DVT Prophylaxis: enoxaparin (LOVENOX) injection 40 mg Start: 02/08/23 1100   Family Communication: None at bedside   Disposition Plan: Status is: Inpatient Remains inpatient appropriate because: Severity of illness   Planned Discharge Destination:Home   Diet: Diet Order             Diet NPO time specified  Diet effective midnight  Antimicrobial agents: Anti-infectives (From admission, onward)    Start     Dose/Rate Route Frequency Ordered Stop   02/08/23 1700  Ampicillin-Sulbactam (UNASYN) 3 g in sodium chloride 0.9 % 100 mL IVPB        3 g 200 mL/hr over 30 Minutes Intravenous Every 6 hours 02/08/23  1058     02/08/23 1015  Ampicillin-Sulbactam (UNASYN) 3 g in sodium chloride 0.9 % 100 mL IVPB        3 g 200 mL/hr over 30 Minutes Intravenous  Once 02/08/23 1007 02/08/23 1046        MEDICATIONS: Scheduled Meds:  enoxaparin (LOVENOX) injection  40 mg Subcutaneous Q24H   gabapentin  300 mg Oral TID   guaiFENesin  600 mg Oral BID   Oxcarbazepine  300 mg Oral BID   prazosin  1 mg Oral QHS   sodium chloride flush  10-40 mL Intracatheter Q12H   sodium chloride flush  3 mL Intravenous Q12H   Continuous Infusions:  ampicillin-sulbactam (UNASYN) IV 3 g (02/12/23 0537)   PRN Meds:.acetaminophen **OR** acetaminophen, albuterol, LORazepam, sodium chloride flush   I have personally reviewed following labs and imaging studies  LABORATORY DATA: CBC: Recent Labs  Lab 02/06/23 0358 02/07/23 1048 02/08/23 1038 02/09/23 0333 02/10/23 0336 02/11/23 0620 02/12/23 0354  WBC 17.2* 18.9* 15.1* 13.8* 14.7* 19.1* 19.7*  NEUTROABS 14.6* 17.8* 12.2*  --   --   --  14.6*  HGB 13.6 13.6 12.1 12.3 12.6 13.6 12.7  HCT 41.7 42.1 37.1 36.6 36.9 40.9 37.6  MCV 85.5 84.5 83.9 81.7 81.6 83.1 82.5  PLT 315 364 351 355 371 439* 416*    Basic Metabolic Panel: Recent Labs  Lab 02/07/23 1048 02/08/23 1038 02/09/23 0333 02/10/23 0336 02/11/23 0620  NA 141 138 137 138 139  K 3.7 3.8 3.7 3.4* 3.5  CL 106 108 105 105 106  CO2 20* 20* 20* 22 23  GLUCOSE 96 81 100* 120* 114*  BUN 12 13 12 10 10   CREATININE 0.82 0.78 0.62 0.66 0.82  CALCIUM 9.0 8.3* 8.7* 8.7* 8.8*    GFR: Estimated Creatinine Clearance: 116.6 mL/min (by C-G formula based on SCr of 0.82 mg/dL).  Liver Function Tests: Recent Labs  Lab 02/06/23 0358 02/08/23 1038  AST 17 27  ALT 12 12  ALKPHOS 49 52  BILITOT 0.8 1.7*  PROT 6.9 6.6  ALBUMIN 3.4* 3.1*   No results for input(s): "LIPASE", "AMYLASE" in the last 168 hours. No results for input(s): "AMMONIA" in the last 168 hours.  Coagulation Profile: Recent Labs  Lab  02/06/23 0930  INR 1.2    Cardiac Enzymes: No results for input(s): "CKTOTAL", "CKMB", "CKMBINDEX", "TROPONINI" in the last 168 hours.  BNP (last 3 results) No results for input(s): "PROBNP" in the last 8760 hours.  Lipid Profile: No results for input(s): "CHOL", "HDL", "LDLCALC", "TRIG", "CHOLHDL", "LDLDIRECT" in the last 72 hours.  Thyroid Function Tests: No results for input(s): "TSH", "T4TOTAL", "FREET4", "T3FREE", "THYROIDAB" in the last 72 hours.  Anemia Panel: No results for input(s): "VITAMINB12", "FOLATE", "FERRITIN", "TIBC", "IRON", "RETICCTPCT" in the last 72 hours.   Urine analysis: No results found for: "COLORURINE", "APPEARANCEUR", "LABSPEC", "PHURINE", "GLUCOSEU", "HGBUR", "BILIRUBINUR", "KETONESUR", "PROTEINUR", "UROBILINOGEN", "NITRITE", "LEUKOCYTESUR"  Sepsis Labs: Lactic Acid, Venous No results found for: "LATICACIDVEN"  MICROBIOLOGY: Recent Results (from the past 240 hour(s))  Blood culture (routine x 2)     Status: None   Collection Time: 02/06/23  9:17 AM   Specimen:  BLOOD RIGHT HAND  Result Value Ref Range Status   Specimen Description BLOOD RIGHT HAND  Final   Special Requests   Final    BOTTLES DRAWN AEROBIC ONLY Blood Culture adequate volume   Culture   Final    NO GROWTH 5 DAYS Performed at [facility], [address], [place], [place] [address]    Report Status 02/11/2023 FINAL  Final  Blood culture (routine x 2)     Status: None   Collection Time: 02/06/23  9:22 AM   Specimen: BLOOD RIGHT HAND  Result Value Ref Range Status   Specimen Description BLOOD RIGHT HAND  Final   Special Requests   Final    BOTTLES DRAWN AEROBIC AND ANAEROBIC Blood Culture adequate volume   Culture   Final    NO GROWTH 5 DAYS Performed at [facility], [address] [place], [place] [address]    Report Status 02/11/2023 FINAL  Final  Fungus Culture With Stain     Status: None (Preliminary result)   Collection Time: 02/06/23 11:23 AM    Specimen: Path fluid; Body Fluid  Result Value Ref Range Status   Fungus Stain Final report  Final    Comment: (NOTE) Performed At: [facility] [address] [place], [place] [id number] Jolene Schimke MD ZD:[id number]    Fungus (Mycology) Culture PENDING  Incomplete   Fungal Source ABSCESS  Final    Comment: Performed at [facility], [address], [place], [place] [address]  Aerobic/Anaerobic Culture w Gram Stain (surgical/deep wound)     Status: None   Collection Time: 02/06/23 11:23 AM   Specimen: Path fluid; Body Fluid  Result Value Ref Range Status   Specimen Description ABSCESS  Final   Special Requests trans oral  Final   Gram Stain   Final    FEW WBC SEEN ABUNDANT GRAM POSITIVE COCCI MODERATE GRAM NEGATIVE RODS Performed at [facility], [address] [place], [place] [address]    Culture   Final    FEW STREPTOCOCCUS GROUP F Beta hemolytic streptococci are predictably susceptible to penicillin and other beta lactams. Susceptibility testing not routinely performed. MODERATE EIKENELLA CORRODENS Usually susceptible to penicillin and other beta lactam agents,quinolones,macrolides and tetracyclines. MIXED ANAEROBIC FLORA PRESENT.  CALL LAB IF FURTHER IID REQUIRED.    Report Status 02/11/2023 FINAL  Final  Fungus Culture Result     Status: None   Collection Time: 02/06/23 11:23 AM  Result Value Ref Range Status   Result 1 Comment  Final    Comment: (NOTE) KOH/Calcofluor preparation:  no fungus observed. Performed At: [facility] [address] [place], [place] [id number] Jolene Schimke MD ZY:[id number]   MRSA Next Gen by PCR, Nasal     Status: None   Collection Time: 02/06/23 12:58 PM   Specimen: Nasal Mucosa; Nasal Swab  Result Value Ref Range Status   MRSA by PCR Next Gen NOT DETECTED NOT DETECTED Final    Comment: (NOTE) The GeneXpert MRSA Assay (FDA approved for NASAL specimens only), is one component of a comprehensive MRSA  colonization surveillance program. It is not intended to diagnose MRSA infection nor to guide or monitor treatment for MRSA infections. Test performance is not FDA approved in patients less than 57 years old. Performed at [facility], [address], [place], [place] [address]     RADIOLOGY STUDIES/RESULTS: No results found.   LOS: 4 days   Jeoffrey Massed, MD  [facility]    To contact the attending provider between 7A-7P or the covering provider during after hours 7P-7A, please log into the web site [email or web address] and access using universal [place] password for that web site. If you do not have the password, please call the hospital operator.  02/12/2023, 9:26 AM

## 2023-02-12 NOTE — H&P (Signed)
HPUPDATE  The surgical history has been reviewed and remains accurate without interval change.  The patient was re-examined and patient's physiologic condition has not changed significantly in the last 30 days. The condition still exists that makes this procedure necessary. The treatment plan remains the same, without new options for care.  No new pharmacological allergies or types of therapy has been initiated that would change the plan or the appropriateness of the plan.  The patient and/or family understand the potential benefits and risks, which were discussed again. Despite these risks, patient continues to wish to proceed.  After review of CT and discussion with patient, will proceed with repeat I&D of pharyngeal abscess. Patient understands the risks/benefits/alternatives and wishes to proceed. ENT team contacted mom as well and she agrees  Read Drivers 02/12/2023 4:31 PM

## 2023-02-12 NOTE — Progress Notes (Signed)
Sitter caught me in hallway and explained to me that pt was waiting to get something to eat. That her MD had said after her procedure at 6 oclock she could eat soft food. Instructed sitter that I would look to see what orders she had. Looked at orders and she has an NPO except meds order only. Looked back into the otolaryngologist note and he had written that soft diet was ok. And when I was going to go to pt room and let her know, I got a phone call from her mother. Mother was stating that if she could eat she would order her some food to be delivered. I told Mom that I need to get a diet order before I can give her anything. Then mother proceeded to tell me she does not know why pt did not already have that order and that she has to call 4 times a day to make sure things are getting done for her. I told her I am sorry, but I can't tell her why she still had an NPO order. That this was the first that I knew about it and that I was trying to get it straightened out. Placed secure chat to Dr. Julian Reil.  Dr. Julian Reil placed soft diet order, but by this time it was after 8pm and kitchen is closed. Spoke with pt and let her know that MD had written a soft diet. She asked me what I was going to get for her then. I told her we only had sandwiches, applesauce and pudding on floor and she stated that that was not food. I told her that her mom had said she would order her some food and she stated her mom was in [place] and that she was not going to let her spend her money. She then told me I needed to figure out what she could get to eat. She also stated she wanted to talk to the charge nurse to find out why that doctor did not order her diet. Informed charge nurse Lowella Bandy. Also found macaroni and cheese or dressing with mashed potatoes and told pt and she said she was not eating any tv dinners. I told her that was all the food we had except for ice cream or italian ice.

## 2023-02-12 NOTE — Evaluation (Signed)
[facility]y-[facility] Speaking Valve - Evaluation Patient Details  Name: Cheryl Tran MRN: [id number] Date of Birth: 06-27-1990  Today's Date: Tran Time: 0272-5366 SLP Time Calculation (min) (ACUTE ONLY): 17 min  Past Medical History:  Past Medical History:  Diagnosis Date   Anxiety    Asthma    Bipolar 1 disorder (HCC)    Depression    Past Surgical History:  Past Surgical History:  Procedure Laterality Date   KNEE ARTHROSCOPY WITH ANTERIOR CRUCIATE LIGAMENT (ACL) REPAIR Right 08/18/2022   Procedure: RIGHT KNEE ARTHROSCOPY WITH ANTERIOR CRUCIATE LIGAMENT (ACL) REPAIR;  Surgeon: Cheryl Cote, MD;  Location: [facility];  Service: Orthopedics;  Laterality: Right;   KNEE ARTHROSCOPY WITH MACI CARTILAGE HARVEST Right 06/22/2022   Procedure: RIGHT KNEE ARTHROSCOPY PATELLA CHONDROPLASTY  WITH MACI CARTILAGE HARVEST;  Surgeon: Cheryl Cote, MD;  Location: [facility];  Service: Orthopedics;  Laterality: Right;   OSTEOCHONDRAL DEFECT REPAIR/RECONSTRUCTION Right 08/18/2022   Procedure: OSTEOCHONDRAL DEFECT REPAIR/RECONSTRUCTION/MACI IMPLANTATION;  Surgeon: Cheryl Cote, MD;  Location: [facility];  Service: Orthopedics;  Laterality: Right;   TONSILLECTOMY AND ADENOIDECTOMY N/A 02/06/2023   Procedure: TRANSORAL DRAINAGE ABSCESS;  Surgeon: Cheryl Croon, MD;  Location: [facility];  Service: ENT;  Laterality: N/A;   TRACHEOSTOMY TUBE PLACEMENT N/A 02/06/2023   Procedure: FIBEROPTIC INTUBATION WITH TRACHEOSTOMY;  Surgeon: Cheryl Croon, MD;  Location: [facility];  Service: ENT;  Laterality: N/A;   HPI:  Patient is a 32 y.o. female with history of depression-asthma-who was recently hospitalized from 10/19-10/21 on the PCCM service for right retropharyngeal abscess requiring I&D and tracheotomy-she unfortunately signed out AMA and came back to the hospital for shortness of breath-she was then admitted to the hospitalist service. Trach 10/19 by ENT Dr. Irene Tran.   10/19>> CT neck: Extensive soft tissue infection arising from one of the right sided molar extraction sites with right parapharyngeal/masticator space abscess. 10/24>> CT neck:  right masticator space/peritonsillar abscess is decreased in size but not resolved since the study from 02/06/2023 following incision and drainage, with persistent surrounding mucosal edema and near-complete effacement of the oropharyngeal airway.    Assessment / Plan / Recommendation  Clinical Impression  Pt participated in PMV assessment. Talking around trach and using digital occlusion prior to placement of valve. Demonstrated excellent toleration. No backflow upon removal; reports no discomfort. VS stable with use. Pt was taught how to place/remove valve and encouraged to remove prior to sleep. Instructed how to clean.  Demonstrated donning/doffing independently and understands she should remove valve should she experience diffiulty breathing, though this is unlikely. She verbalized understanding. Rec pt wear PMV all waking hours. SLP will follow. SLP Visit Diagnosis: Aphonia (R49.1)    SLP Assessment  Patient needs continued Speech Lanaguage Pathology Services    Recommendations for follow up therapy are one component of a multi-disciplinary discharge planning process, led by the attending physician.  Recommendations may be updated based on patient status, additional functional criteria and insurance authorization.  Follow Up Recommendations  No SLP follow up        Functional Status Assessment Patient has had a recent decline in their functional status and demonstrates the ability to make significant improvements in function in a reasonable and predictable amount of time.  Frequency and Duration min 2x/week  1 week    PMSV Trial PMSV was placed for: 10 Able to redirect subglottic air through upper airway: Yes Able to Attain Phonation: Yes Voice Quality: Normal Able to Expectorate Secretions: No  attempts Breath  Support for Phonation: Adequate Intelligibility: Intelligible Behavior: Alert   Tracheostomy Tube  Additional Tracheostomy Tube Assessment Fenestrated: No    Vent Dependency  FiO2 (%): 21 %    Cuff Deflation Trial Tolerated Cuff Deflation:  (uncuffed)  Cheryl Tran L. Cheryl Frederic, MA CCC/SLP Clinical Specialist - Acute Care SLP Acute Rehabilitation Services Office number [phone]        Blenda Tran Cheryl Tran, 3:32 PM

## 2023-02-12 NOTE — Anesthesia Procedure Notes (Addendum)
Procedure Name: Intubation Date/Time: 02/12/2023 4:57 PM  Performed by: Sharyn Dross, CRNAPre-anesthesia Checklist: Patient identified, Emergency Drugs available, Suction available and Patient being monitored Patient Re-evaluated:Patient Re-evaluated prior to induction Oxygen Delivery Method: Circle system utilized Preoxygenation: Pre-oxygenation with 100% oxygen Induction Type: IV induction Ventilation: Mask ventilation without difficulty Tube type: Reinforced Tube size: 6.0 mm Number of attempts: 1 Airway Equipment and Method: Tracheostomy Placement Confirmation: positive ETCO2 and breath sounds checked- equal and bilateral Tube secured with: Tape Dental Injury: Teeth and Oropharynx as per pre-operative assessment  Comments: Uncuffed trach removed and 6.0 reinforced ETT placed without difficulty.

## 2023-02-13 ENCOUNTER — Encounter (HOSPITAL_COMMUNITY): Payer: Self-pay | Admitting: Otolaryngology

## 2023-02-13 DIAGNOSIS — F6089 Other specific personality disorders: Secondary | ICD-10-CM | POA: Diagnosis not present

## 2023-02-13 DIAGNOSIS — F4310 Post-traumatic stress disorder, unspecified: Secondary | ICD-10-CM | POA: Diagnosis not present

## 2023-02-13 DIAGNOSIS — J390 Retropharyngeal and parapharyngeal abscess: Secondary | ICD-10-CM | POA: Diagnosis not present

## 2023-02-13 LAB — CBC WITH DIFFERENTIAL/PLATELET
Abs Immature Granulocytes: 0.17 10*3/uL — ABNORMAL HIGH (ref 0.00–0.07)
Basophils Absolute: 0 10*3/uL (ref 0.0–0.1)
Basophils Relative: 0 %
Eosinophils Absolute: 0.1 10*3/uL (ref 0.0–0.5)
Eosinophils Relative: 1 %
HCT: 39 % (ref 36.0–46.0)
Hemoglobin: 12.8 g/dL (ref 12.0–15.0)
Immature Granulocytes: 1 %
Lymphocytes Relative: 14 %
Lymphs Abs: 2.5 10*3/uL (ref 0.7–4.0)
MCH: 27.4 pg (ref 26.0–34.0)
MCHC: 32.8 g/dL (ref 30.0–36.0)
MCV: 83.3 fL (ref 80.0–100.0)
Monocytes Absolute: 0.9 10*3/uL (ref 0.1–1.0)
Monocytes Relative: 5 %
Neutro Abs: 14.3 10*3/uL — ABNORMAL HIGH (ref 1.7–7.7)
Neutrophils Relative %: 79 %
Platelets: 420 10*3/uL — ABNORMAL HIGH (ref 150–400)
RBC: 4.68 MIL/uL (ref 3.87–5.11)
RDW: 13.5 % (ref 11.5–15.5)
WBC: 17.9 10*3/uL — ABNORMAL HIGH (ref 4.0–10.5)
nRBC: 0 % (ref 0.0–0.2)

## 2023-02-13 NOTE — Consult Note (Signed)
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   LORazepam (ATIVAN) injection 0.5 mg, 0.5 mg, Intravenous, Q4H PRN, Lorin Glass, MD, 0.5 mg at 02/10/23 2313   Oxcarbazepine (TRILEPTAL) tablet 300 mg, 300 mg, Oral, BID, Smith, Rondell A, MD, 300 mg at 02/13/23 1100   prazosin (MINIPRESS) capsule 1 mg, 1 mg, Oral, QHS, Smith, Rondell A, MD, 1 mg at 02/12/23 2153   sodium chloride flush (NS) 0.9 % injection 10-40 mL, 10-40 mL, Intracatheter, Q12H, Ghimire, Shanker M, MD, 10 mL at 02/12/23 2154   sodium chloride flush (NS) 0.9 % injection 10-40 mL, 10-40 mL, Intracatheter, PRN, Ghimire, Shanker M, MD   sodium chloride flush (NS) 0.9 % injection 3 mL, 3 mL, Intravenous, Q12H, Smith, Rondell A, MD, 3 mL at 02/12/23 2154  Allergies: No Known Allergies

## 2023-02-13 NOTE — Plan of Care (Signed)
  Problem: Education: Goal: Knowledge of General Education information will improve Description: Including pain rating scale, medication(s)/side effects and non-pharmacologic comfort measures Outcome: Progressing   Problem: Health Behavior/Discharge Planning: Goal: Ability to manage health-related needs will improve Outcome: Progressing   Problem: Clinical Measurements: Goal: Ability to maintain clinical measurements within normal limits will improve Outcome: Progressing Goal: Will remain free from infection Outcome: Progressing Goal: Diagnostic test results will improve Outcome: Progressing Goal: Respiratory complications will improve Outcome: Progressing Goal: Cardiovascular complication will be avoided Outcome: Progressing   Problem: Activity: Goal: Risk for activity intolerance will decrease Outcome: Progressing   Problem: Nutrition: Goal: Adequate nutrition will be maintained Outcome: Progressing   Problem: Coping: Goal: Level of anxiety will decrease Outcome: Progressing   Problem: Elimination: Goal: Will not experience complications related to bowel motility Outcome: Progressing Goal: Will not experience complications related to urinary retention Outcome: Progressing   Problem: Pain Managment: Goal: General experience of comfort will improve Outcome: Progressing   Problem: Safety: Goal: Ability to remain free from injury will improve Outcome: Progressing   Problem: Skin Integrity: Goal: Risk for impaired skin integrity will decrease Outcome: Progressing   Problem: Education: Goal: Ability to state activities that reduce stress will improve Outcome: Progressing   Problem: Coping: Goal: Ability to identify and develop effective coping behavior will improve Outcome: Progressing   Problem: Self-Concept: Goal: Ability to identify factors that promote anxiety will improve Outcome: Progressing Goal: Level of anxiety will decrease Outcome:  Progressing Goal: Ability to modify response to factors that promote anxiety will improve Outcome: Progressing   

## 2023-02-13 NOTE — Plan of Care (Addendum)
Pt has rested quietly throughout the night with no distress noted after night dose of unasyn given. Alaert and oriented. On trach collar. Up to BR independently. Sitter at bedside. No further complaiints voiced.     Problem: Education: Goal: Knowledge of General Education information will improve Description: Including pain rating scale, medication(s)/side effects and non-pharmacologic comfort measures Outcome: Progressing   Problem: Health Behavior/Discharge Planning: Goal: Ability to manage health-related needs will improve Outcome: Progressing   Problem: Clinical Measurements: Goal: Ability to maintain clinical measurements within normal limits will improve Outcome: Progressing Goal: Will remain free from infection Outcome: Progressing Goal: Diagnostic test results will improve Outcome: Progressing Goal: Respiratory complications will improve Outcome: Progressing Goal: Cardiovascular complication will be avoided Outcome: Progressing   Problem: Activity: Goal: Risk for activity intolerance will decrease Outcome: Progressing   Problem: Nutrition: Goal: Adequate nutrition will be maintained Outcome: Progressing   Problem: Coping: Goal: Level of anxiety will decrease Outcome: Progressing   Problem: Elimination: Goal: Will not experience complications related to bowel motility Outcome: Progressing Goal: Will not experience complications related to urinary retention Outcome: Progressing   Problem: Pain Managment: Goal: General experience of comfort will improve Outcome: Progressing   Problem: Safety: Goal: Ability to remain free from injury will improve Outcome: Progressing   Problem: Skin Integrity: Goal: Risk for impaired skin integrity will decrease Outcome: Progressing   Problem: Education: Goal: Ability to state activities that reduce stress will improve Outcome: Progressing   Problem: Coping: Goal: Ability to identify and develop effective coping  behavior will improve Outcome: Progressing   Problem: Self-Concept: Goal: Ability to identify factors that promote anxiety will improve Outcome: Progressing Goal: Level of anxiety will decrease Outcome: Progressing Goal: Ability to modify response to factors that promote anxiety will improve Outcome: Progressing

## 2023-02-13 NOTE — Anesthesia Postprocedure Evaluation (Signed)
Anesthesia Post Note  Patient: Etna Artis Flock  Procedure(s) Performed: INCISION AND DRAINAGE PHARYNGEAL ABSCESS     Patient location during evaluation: PACU Anesthesia Type: General Level of consciousness: awake and alert Pain management: pain level controlled Vital Signs Assessment: post-procedure vital signs reviewed and stable Respiratory status: spontaneous breathing, nonlabored ventilation, respiratory function stable and patient connected to nasal cannula oxygen Cardiovascular status: blood pressure returned to baseline and stable Postop Assessment: no apparent nausea or vomiting Anesthetic complications: no   There were no known notable events for this encounter.  Last Vitals:  Vitals:   02/12/23 2300 02/13/23 0320  BP: (!) 116/93   Pulse: 67   Resp: 15   Temp: 37 C 37.1 C  SpO2: 97%     Last Pain:  Vitals:   02/13/23 0320  TempSrc: Oral  PainSc:                  Mariann Barter

## 2023-02-13 NOTE — Progress Notes (Signed)
Spoke with patient regarding her complaints.  She was upset that the OR physician did not place any new diet orders post procedure, and she stated that nursing staff did not address these concerns in a timely manner, causing her to continue as NPO status.  This RN did not receive calls regarding this situation until 19:55.  With the cafeteria closing at 2000, it was not possible to get the diet order changed in time.  I spoke with the patient at length regarding other options for food available on the unit at that time; as well as, apologizing for the inconvenience.  Since I could not give her what she wanted, she became increasingly irate, and demanded that I leave her room at once.  Primary RN at the bedside to witness conversation.  Will continue to monitor situation.

## 2023-02-13 NOTE — Progress Notes (Signed)
PROGRESS NOTE        PATIENT DETAILS Name: Cheryl Tran Age: 32 y.o. Sex: female Date of Birth: 01/06/1991 Admit Date: 02/08/2023 Admitting Physician Clydie Braun, MD JYN:[id number], Judeth Cornfield, NP  Brief Summary: Patient is a 32 y.o.  female with history of depression-asthma-who was recently hospitalized from 10/19-10/21 on the PCCM service for right retropharyngeal abscess requiring I&D and tracheotomy-she unfortunately signed out AMA and came back to the hospital for shortness of breath-she was then admitted to the hospitalist service.  See below for further details.  Significant events: 10/19-10/21>> hospitalization for retropharyngeal abscess-s/p tracheotomy/I&D-signed out AMA 10/21>> back to the ED-admit to [facility]  Significant studies: 10/19>> CT neck: Extensive soft tissue infection arising from one of the right sided molar extraction sites with right parapharyngeal/masticator space abscess.  Significant microbiology data: 10/19>> blood culture: No growth 10/19>> retropharyngeal abscess culture: Streptococcus group F, Eikenella  Procedures: 10/25>> I&D of parapharyngeal abscess by ENT  Consults: ENT Psychiatry PCCM  Subjective: Frustrated that she is still hospitalized. Feels better-inquiring about discharge.  Explained that she will remain hospitalized-has a drain in place-still has leukocytosis and remains on IV antibiotics.  Objective: Vitals: Blood pressure (!) 138/104, pulse 67, temperature 98 F (36.7 C), temperature source Oral, resp. rate 20, height 5\' 8"  (1.727 m), weight 93.3 kg, last menstrual period 01/20/2023, SpO2 97%.   Exam: Gen Exam:Alert awake-not in any distress HEENT:atraumatic, normocephalic Chest: B/L clear to auscultation anteriorly CVS:S1S2 regular Abdomen:soft non tender, non distended Extremities:no edema Neurology: Non focal Skin: no rash  Pertinent Labs/Radiology:    Latest Ref Rng & Units 02/13/2023    4:57  AM 02/12/2023    3:54 AM 02/11/2023    6:20 AM  CBC  WBC 4.0 - 10.5 K/uL 17.9  19.7  19.1   Hemoglobin 12.0 - 15.0 g/dL 30.8  65.7  84.6   Hematocrit 36.0 - 46.0 % 39.0  37.6  40.9   Platelets 150 - 400 K/uL 420  416  439     Lab Results  Component Value Date   NA 139 02/11/2023   K 3.5 02/11/2023   CL 106 02/11/2023   CO2 23 02/11/2023      Assessment/Plan: Upper airway obstruction due to retropharyngeal abscess-s/p I&D and tracheotomy on 10/19 and repeat I&D on 10/24 Clinically improved after I&D on 10/24 Has drain in place IV Unasyn ID/ENT following Trach care/education ongoing Patient aware that she needs to remain hospitalized until her leukocytosis gets better further-and  till her drain is removed. Reassess for discharge on 10/27.  Depression/PTSD IVC-bedside sitter in place Psych following-recommendations are to continue IVC for now Continue Tegretol/Neurontin/Minipress  Bronchial asthma Stable Bronchodilators  Hypokalemia Repleted  Obesity: Estimated body mass index is 31.27 kg/m as calculated from the following:   Height as of this encounter: 5\' 8"  (1.727 m).   Weight as of this encounter: 93.3 kg.   Code status:   Code Status: Full Code   DVT Prophylaxis: enoxaparin (LOVENOX) injection 40 mg Start: 02/08/23 1100   Family Communication:Mother-Tara [place] [phone] updated over the phone on 10/26   Disposition Plan: Status is: Inpatient Remains inpatient appropriate because: Severity of illness   Planned Discharge Destination:Home   Diet: Diet Order             DIET SOFT Room service appropriate? Yes; Fluid consistency: Thin  Diet effective  now                     Antimicrobial agents: Anti-infectives (From admission, onward)    Start     Dose/Rate Route Frequency Ordered Stop   02/08/23 1700  Ampicillin-Sulbactam (UNASYN) 3 g in sodium chloride 0.9 % 100 mL IVPB        3 g 200 mL/hr over 30 Minutes Intravenous Every 6 hours  02/08/23 1058     02/08/23 1015  Ampicillin-Sulbactam (UNASYN) 3 g in sodium chloride 0.9 % 100 mL IVPB        3 g 200 mL/hr over 30 Minutes Intravenous  Once 02/08/23 1007 02/08/23 1046        MEDICATIONS: Scheduled Meds:  chlorhexidine  15 mL Mouth/Throat BID   enoxaparin (LOVENOX) injection  40 mg Subcutaneous Q24H   gabapentin  300 mg Oral TID   guaiFENesin  600 mg Oral BID   Oxcarbazepine  300 mg Oral BID   prazosin  1 mg Oral QHS   sodium chloride flush  10-40 mL Intracatheter Q12H   sodium chloride flush  3 mL Intravenous Q12H   Continuous Infusions:  ampicillin-sulbactam (UNASYN) IV 3 g (02/13/23 0531)   PRN Meds:.acetaminophen **OR** acetaminophen, albuterol, LORazepam, sodium chloride flush   I have personally reviewed following labs and imaging studies  LABORATORY DATA: CBC: Recent Labs  Lab 02/07/23 1048 02/08/23 1038 02/09/23 0333 02/10/23 0336 02/11/23 0620 02/12/23 0354 02/13/23 0457  WBC 18.9* 15.1* 13.8* 14.7* 19.1* 19.7* 17.9*  NEUTROABS 17.8* 12.2*  --   --   --  14.6* 14.3*  HGB 13.6 12.1 12.3 12.6 13.6 12.7 12.8  HCT 42.1 37.1 36.6 36.9 40.9 37.6 39.0  MCV 84.5 83.9 81.7 81.6 83.1 82.5 83.3  PLT 364 351 355 371 439* 416* 420*    Basic Metabolic Panel: Recent Labs  Lab 02/07/23 1048 02/08/23 1038 02/09/23 0333 02/10/23 0336 02/11/23 0620  NA 141 138 137 138 139  K 3.7 3.8 3.7 3.4* 3.5  CL 106 108 105 105 106  CO2 20* 20* 20* 22 23  GLUCOSE 96 81 100* 120* 114*  BUN 12 13 12 10 10   CREATININE 0.82 0.78 0.62 0.66 0.82  CALCIUM 9.0 8.3* 8.7* 8.7* 8.8*    GFR: Estimated Creatinine Clearance: 116.6 mL/min (by C-G formula based on SCr of 0.82 mg/dL).  Liver Function Tests: Recent Labs  Lab 02/08/23 1038  AST 27  ALT 12  ALKPHOS 52  BILITOT 1.7*  PROT 6.6  ALBUMIN 3.1*   No results for input(s): "LIPASE", "AMYLASE" in the last 168 hours. No results for input(s): "AMMONIA" in the last 168 hours.  Coagulation Profile: No  results for input(s): "INR", "PROTIME" in the last 168 hours.   Cardiac Enzymes: No results for input(s): "CKTOTAL", "CKMB", "CKMBINDEX", "TROPONINI" in the last 168 hours.  BNP (last 3 results) No results for input(s): "PROBNP" in the last 8760 hours.  Lipid Profile: No results for input(s): "CHOL", "HDL", "LDLCALC", "TRIG", "CHOLHDL", "LDLDIRECT" in the last 72 hours.  Thyroid Function Tests: No results for input(s): "TSH", "T4TOTAL", "FREET4", "T3FREE", "THYROIDAB" in the last 72 hours.  Anemia Panel: No results for input(s): "VITAMINB12", "FOLATE", "FERRITIN", "TIBC", "IRON", "RETICCTPCT" in the last 72 hours.   Urine analysis: No results found for: "COLORURINE", "APPEARANCEUR", "LABSPEC", "PHURINE", "GLUCOSEU", "HGBUR", "BILIRUBINUR", "KETONESUR", "PROTEINUR", "UROBILINOGEN", "NITRITE", "LEUKOCYTESUR"  Sepsis Labs: Lactic Acid, Venous No results found for: "LATICACIDVEN"  MICROBIOLOGY: Recent Results (from the past 240 hour(s))  Blood culture (routine x 2)     Status: None   Collection Time: 02/06/23  9:17 AM   Specimen: BLOOD RIGHT HAND  Result Value Ref Range Status   Specimen Description BLOOD RIGHT HAND  Final   Special Requests   Final    BOTTLES DRAWN AEROBIC ONLY Blood Culture adequate volume   Culture   Final    NO GROWTH 5 DAYS Performed at [facility], [address] [place], [place] [address]    Report Status 02/11/2023 FINAL  Final  Blood culture (routine x 2)     Status: None   Collection Time: 02/06/23  9:22 AM   Specimen: BLOOD RIGHT HAND  Result Value Ref Range Status   Specimen Description BLOOD RIGHT HAND  Final   Special Requests   Final    BOTTLES DRAWN AEROBIC AND ANAEROBIC Blood Culture adequate volume   Culture   Final    NO GROWTH 5 DAYS Performed at [facility], [address], [place], [place] [address]    Report Status 02/11/2023 FINAL  Final  Fungus Culture With Stain     Status: None (Preliminary result)    Collection Time: 02/06/23 11:23 AM   Specimen: Path fluid; Body Fluid  Result Value Ref Range Status   Fungus Stain Final report  Final    Comment: (NOTE) Performed At: [facility] [address] [place], [place] [id number] Jolene Schimke MD ON:[id number]    Fungus (Mycology) Culture PENDING  Incomplete   Fungal Source ABSCESS  Final    Comment: Performed at [facility], [address], [place], [place] [address]  Aerobic/Anaerobic Culture w Gram Stain (surgical/deep wound)     Status: None   Collection Time: 02/06/23 11:23 AM   Specimen: Path fluid; Body Fluid  Result Value Ref Range Status   Specimen Description ABSCESS  Final   Special Requests trans oral  Final   Gram Stain   Final    FEW WBC SEEN ABUNDANT GRAM POSITIVE COCCI MODERATE GRAM NEGATIVE RODS Performed at [facility], [address] [place], [place] [address]    Culture   Final    FEW STREPTOCOCCUS GROUP F Beta hemolytic streptococci are predictably susceptible to penicillin and other beta lactams. Susceptibility testing not routinely performed. MODERATE EIKENELLA CORRODENS Usually susceptible to penicillin and other beta lactam agents,quinolones,macrolides and tetracyclines. MIXED ANAEROBIC FLORA PRESENT.  CALL LAB IF FURTHER IID REQUIRED.    Report Status 02/11/2023 FINAL  Final  Fungus Culture Result     Status: None   Collection Time: 02/06/23 11:23 AM  Result Value Ref Range Status   Result 1 Comment  Final    Comment: (NOTE) KOH/Calcofluor preparation:  no fungus observed. Performed At: [facility] [address] [place], [place] [id number] Jolene Schimke MD QV:[id number]   MRSA Next Gen by PCR, Nasal     Status: None   Collection Time: 02/06/23 12:58 PM   Specimen: Nasal Mucosa; Nasal Swab  Result Value Ref Range Status   MRSA by PCR Next Gen NOT DETECTED NOT DETECTED Final    Comment: (NOTE) The GeneXpert MRSA Assay (FDA approved for NASAL specimens only), is one  component of a comprehensive MRSA colonization surveillance program. It is not intended to diagnose MRSA infection nor to guide or monitor treatment for MRSA infections. Test performance is not FDA approved in patients less than 38 years old. Performed at [facility], [address] [place],  [place] [address]   Aerobic/Anaerobic Culture w Gram Stain (surgical/deep wound)     Status: None (Preliminary result)   Collection Time: 02/12/23  5:09 PM   Specimen: PATH ENT biopsy; Tissue  Result Value Ref Range Status   Specimen Description WOUND  Final   Special Requests LEFT PERITONSILAR  Final   Gram Stain   Final    FEW WBC PRESENT, PREDOMINANTLY PMN NO ORGANISMS SEEN Performed at [facility], [address] [place], [place] [address]    Culture PENDING  Incomplete   Report Status PENDING  Incomplete    RADIOLOGY STUDIES/RESULTS: DG CHEST PORT 1 VIEW  Result Date: 02/12/2023 CLINICAL DATA:  Previous right retropharyngeal abscess incision and drainage with tracheostomy tube placement, shortness of breath EXAM: PORTABLE CHEST 1 VIEW COMPARISON:  CT neck 02/12/2023 FINDINGS: There continues to be pneumomediastinum as well as gas tracking in the neck and upper shoulder subcutaneous regions bilaterally. No pneumothorax is observed. Tracheostomy tube appears satisfactorily positioned. The lungs appear clear. Heart size within normal limits for AP projection. No significant bony abnormality observed. IMPRESSION: 1. Persistent pneumomediastinum and gas tracking in the neck and upper shoulder subcutaneous regions bilaterally. 2. Tracheostomy tube appears satisfactorily positioned over the tracheal air shadow. 3. No pneumothorax is observed. Electronically Signed   By: Gaylyn Rong M.D.   On: 02/12/2023 20:01   CT SOFT TISSUE NECK W CONTRAST  Result Date: 02/12/2023 CLINICAL DATA:  Right retropharyngeal abscess requiring incision and drainage and tracheostomy tube presented  back to the hospital for shortness of breath. EXAM: CT NECK WITH CONTRAST TECHNIQUE: Multidetector CT imaging of the neck was performed using the standard protocol following the bolus administration of intravenous contrast. RADIATION DOSE REDUCTION: This exam was performed according to the departmental dose-optimization program which includes automated exposure control, adjustment of the mA and/or kV according to patient size and/or use of iterative reconstruction technique. CONTRAST:  75mL OMNIPAQUE IOHEXOL 350 MG/ML SOLN COMPARISON:  CT neck 02/06/2023 FINDINGS: Pharynx and larynx: There is mucosal edema and enhancement throughout the nasopharynx and oropharynx. There is a persistent right peritonsillar/masticator space abscess measuring up to approximately 2.2 cm AP x 1.1 cm TV x 1.9 cm cc, decreased in size compared to the prior study from 02/06/2023 (3-53, 4-47). The collection extends from the region of the right mandibular molar extraction site through the parapharyngeal space to the oropharyngeal mucosa and superiorly towards the nasopharynx. There is persistent near-complete effacement of the oropharyngeal airway. The larynx is unremarkable.  The epiglottis is normal. There is extensive retropharyngeal gas and subcutaneous emphysema throughout the remainder of the neck extending into the mediastinum and anterior chest wall. The gas is new since the prior study. Salivary glands: The parotid and submandibular glands are unremarkable. Thyroid: Unremarkable. Lymph nodes: Previously seen reactive cervical lymphadenopathy has decreased. Vascular: The major vasculature of the neck is unremarkable. Limited intracranial: Unremarkable. Visualized orbits: Unremarkable. Mastoids and visualized paranasal sinuses: There is mild mucosal thickening in the maxillary sinuses. The mastoid air cells and middle ear cavities are clear. Skeleton: There is no acute osseous abnormality or suspicious osseous lesion. Upper chest: The  tracheostomy tube tip is in the midthoracic trachea. There are patchy opacities in the right upper and lower lobes and left upper lobe which are new since the prior study (3-89, 116, 124). Other: None. IMPRESSION: 1. The right masticator space/peritonsillar abscess is decreased in size but not resolved since the study from 02/06/2023 following incision and drainage, with persistent surrounding mucosal  edema and near-complete effacement of the oropharyngeal airway. 2. Decreased reactive cervical lymphadenopathy. 3. Extensive soft tissue gas throughout the neck extending into the upper mediastinum and anterior chest wall. This may be iatrogenic following incision and drainage or related to positive pressure ventilation. 4. New patchy opacities in both imaged lungs, likely infectious/inflammatory. Electronically Signed   By: Lesia Hausen M.D.   On: 02/12/2023 14:39     LOS: 5 days   Jeoffrey Massed, MD  [facility]    To contact the attending provider between 7A-7P or the covering provider during after hours 7P-7A, please log into the web site [email or web address] and access using universal [place] password for that web site. If you do not have the password, please call the hospital operator.  02/13/2023, 11:04 AM

## 2023-02-13 NOTE — Progress Notes (Signed)
1 Day Post-Op  Subjective:  POD # 6 SP Tracheostomy and transoral incision and drainage of the parapharyngeal masticator space abscess by Dr. Irene Pap on 02/06/2023 and then re-drainage on 02/12/23  The patient notes improvement in throat pain again today. No gingival tenderness. She is frustrated that she has to stay longer. Good voice with finger plugging and PMV. GS without organisms. No fevers. WNC downtrending  Objective: Vital signs in last 24 hours: Temp:  [97.5 F (36.4 C)-99.6 F (37.6 C)] 98 F (36.7 C) (10/26 0738) Pulse Rate:  [59-86] 67 (10/25 2300) Resp:  [10-20] 15 (10/25 2300) BP: (116-156)/(92-107) 138/104 (10/26 0738) SpO2:  [95 %-100 %] 97 % (10/25 2300) FiO2 (%):  [21 %] 21 % (10/26 0320) Last BM Date : 02/09/23   Physical Exam:  General: Pt resting comfortably in no acute distress Neck: Supple, trach site is clean with no signs of infection, bleeding, or swelling. 6.0 cuffless Shiley in place, Phonating well with PMV and finger plugging, minimal secretions. Subcutaneous emphysema (crepitus) noted to the right anterior neck extending around neck up to angle of mandible on left. Non tender, no warmth to touch.  Intraoral penrose drain in place, no purulence on milking the surrounding mucosa. Clear view to posterior oropharynx  Labs/Cx reviewed independently: WBC 18 (10/26) from 20 Cx: Pending 10/25; Gram stain: PMNs, no organisms CT Neck 10/25: Re-collection of abscess similar to prior; subcutaneous emphysema extensive in neck and along mediastinum CXR 10/25: No pneumothorax  Assessment/Plan: 32 yo with odontogenic pharyngeal abscess: POD # 6 SP Tracheostomy and transoral incision and drainage of the parapharyngeal masticator space abscess by Dr. Irene Pap on 02/06/2023. Trach changed to 6.0 Cuffless Shiley 02/09/2023. Abscess noted to be re-collected on 02/12/23 and re-drained transorally.  - Continue IV antibiotics, per ID and primary team - will follow up  culture results - Will continue to keep trach in place until improvement. Ok for [facility]; suspect she will do well with plugging - Trend CBC - CXR 10/25 due to pneumomediastinum and subcu air in neck - likely from tracheostomy tract as no pneumothorax, improving today; avoid positive pressure - Ok for diet - soft diet - Peridex mouthwash BID x7d with good oral care - ENT will follow; drain to stay in place until infectious markers improve - no specific care needed for drain - Remainder of care per primary team  Please message with any questions  Read Drivers, MD 02/13/2023

## 2023-02-14 DIAGNOSIS — F4310 Post-traumatic stress disorder, unspecified: Secondary | ICD-10-CM | POA: Diagnosis not present

## 2023-02-14 DIAGNOSIS — J390 Retropharyngeal and parapharyngeal abscess: Secondary | ICD-10-CM | POA: Diagnosis not present

## 2023-02-14 DIAGNOSIS — F6089 Other specific personality disorders: Secondary | ICD-10-CM | POA: Diagnosis not present

## 2023-02-14 LAB — CBC
HCT: 36.7 % (ref 36.0–46.0)
Hemoglobin: 12.3 g/dL (ref 12.0–15.0)
MCH: 27.9 pg (ref 26.0–34.0)
MCHC: 33.5 g/dL (ref 30.0–36.0)
MCV: 83.2 fL (ref 80.0–100.0)
Platelets: 414 10*3/uL — ABNORMAL HIGH (ref 150–400)
RBC: 4.41 MIL/uL (ref 3.87–5.11)
RDW: 13.2 % (ref 11.5–15.5)
WBC: 13 10*3/uL — ABNORMAL HIGH (ref 4.0–10.5)
nRBC: 0 % (ref 0.0–0.2)

## 2023-02-14 NOTE — Progress Notes (Signed)
2 Days Post-Op  Subjective:  POD # 6 SP Tracheostomy and transoral incision and drainage of the parapharyngeal masticator space abscess by Dr. Irene Pap on 02/06/2023 and then re-drainage with me on 02/12/23  Continued improvement today in oral/oropharyngeal pain, now not tender to palpation. Trace purulence (mucoid?) drainage when drain milked. Continues to wish to go home. No fevers. WNC downtrending  Objective: Vital signs in last 24 hours: Temp:  [97.4 F (36.3 C)-99.1 F (37.3 C)] 97.4 F (36.3 C) (10/27 0520) Pulse Rate:  [71-100] 78 (10/27 0949) Resp:  [16-20] 19 (10/27 0949) BP: (116-138)/(80-95) 138/91 (10/27 0949) SpO2:  [98 %-100 %] 98 % (10/27 0949) FiO2 (%):  [21 %] 21 % (10/27 0323) Last BM Date : 02/09/23   Physical Exam:  General: Pt resting comfortably in no acute distress Neck: Supple, trach site is clean with no signs of infection, bleeding, or swelling. 6.0 cuffless Shiley in place, Phonating well with PMV and finger plugging, minimal secretions. Subcutaneous emphysema (crepitus) noted to the right anterior neck extending around neck up to angle of mandible on left. Non tender, no warmth to touch.  Intraoral penrose drain in place, query trace purulence on milking the surrounding mucosa but edema clearly improved. Clear view to posterior oropharynx  Labs/Cx reviewed independently: WBC 13 (10/27) from 20 prior to surgery Cx: Pending 10/25; Gram stain: PMNs, no organisms CT Neck 10/25: Re-collection of abscess similar to prior; subcutaneous emphysema extensive in neck and along mediastinum CXR 10/25: No pneumothorax  Assessment/Plan: 32 yo with odontogenic pharyngeal abscess: POD # 6 SP Tracheostomy and transoral incision and drainage of the parapharyngeal masticator space abscess by Dr. Irene Pap on 02/06/2023. Trach changed to 6.0 Cuffless Shiley 02/09/2023. Abscess noted to be re-collected on 02/12/23 and re-drained transorally.  - Continue IV antibiotics, per  ID and primary team - will follow up culture results - Will continue to keep trach in place until improvement. Ok for [facility]; suspect she will do well with plugging - Trend CBC - CXR 10/25 due to pneumomediastinum and subcu air in neck - likely from tracheostomy tract as no pneumothorax, improving today; avoid positive pressure - Ok for diet - soft diet - Peridex mouthwash BID x7d with good oral care - Will keep drain today; but anticipate removal 10/28 if continues to improve; tentative discharge 10/28 if continues to improve - Remainder of care per primary team  Please message with any questions  Read Drivers, MD 02/14/2023

## 2023-02-14 NOTE — Progress Notes (Signed)
PROGRESS NOTE        PATIENT DETAILS Name: Cheryl Tran Age: 32 y.o. Sex: female Date of Birth: [birth year] Admit Date: 02/08/2023 Admitting Physician Clydie Braun, MD EXB:[id number], Judeth Cornfield, NP  Brief Summary: Patient is a 32 y.o.  female with history of depression-asthma-who was recently hospitalized from 10/19-10/21 on the PCCM service for right retropharyngeal abscess requiring I&D and tracheotomy-she unfortunately signed out AMA and came back to the hospital for shortness of breath-she was then admitted to the hospitalist service.  See below for further details.  Significant events: 10/19-10/21>> hospitalization for retropharyngeal abscess-s/p tracheotomy/I&D-signed out AMA 10/21>> back to the ED-admit to [facility]  Significant studies: 10/19>> CT neck: Extensive soft tissue infection arising from one of the right sided molar extraction sites with right parapharyngeal/masticator space abscess.  Significant microbiology data: 10/19>> blood culture: No growth 10/19>> retropharyngeal abscess culture: Streptococcus group F, Eikenella  Procedures: 10/25>> I&D of parapharyngeal abscess by ENT  Consults: ENT Psychiatry PCCM  Subjective: Feels much better-no complaints-her mood is much better.  Understands the reason for her ongoing hospitalization.  Objective: Vitals: Blood pressure 116/80, pulse 75, temperature (!) 97.4 F (36.3 C), temperature source Oral, resp. rate 16, height 5\' 8"  (1.727 m), weight 93.3 kg, last menstrual period 01/20/2023, SpO2 100%.   Exam: Gen Exam:Alert awake-not in any distress HEENT:atraumatic, normocephalic Chest: B/L clear to auscultation anteriorly CVS:S1S2 regular Abdomen:soft non tender, non distended Extremities:no edema Neurology: Non focal Skin: no rash  Pertinent Labs/Radiology:    Latest Ref Rng & Units 02/14/2023    4:33 AM 02/13/2023    4:57 AM 02/12/2023    3:54 AM  CBC  WBC 4.0 - 10.5 K/uL 13.0  17.9   19.7   Hemoglobin 12.0 - 15.0 g/dL 40.1  02.7  25.3   Hematocrit 36.0 - 46.0 % 36.7  39.0  37.6   Platelets 150 - 400 K/uL 414  420  416     Lab Results  Component Value Date   NA 139 02/11/2023   K 3.5 02/11/2023   CL 106 02/11/2023   CO2 23 02/11/2023      Assessment/Plan: Upper airway obstruction due to retropharyngeal abscess-s/p I&D and tracheotomy on 10/19 and repeat I&D on 10/24 Clinically improved after repeat I&D on 10/24-leukocytosis downtrending Has drain in place IV Unasyn ID/ENT following Trach care/education ongoing Discussed with ENT-Dr. Patel-recommends that patient remain in the hospital until 10/28-still having some discharge-drain remains in place-ENT will reevaluate 10/28-and hopefully will be able to be discharged home tomorrow  Depression/PTSD IVC-bedside sitter in place Appreciate psych follow-up on10/26-continue IVC until patient is medically stable for discharge.  As noted above-likely discharge on 10/28-after ENT reassess patient.   In the interim-continue Tegretol/Neurontin/Minipress.   Mood seems much better this morning-and seems more understanding of medical situation.   Bronchial asthma Stable Bronchodilators  Hypokalemia Repleted  Obesity: Estimated body mass index is 31.27 kg/m as calculated from the following:   Height as of this encounter: 5\' 8"  (1.727 m).   Weight as of this encounter: 93.3 kg.   Code status:   Code Status: Full Code   DVT Prophylaxis: enoxaparin (LOVENOX) injection 40 mg Start: 02/08/23 1100   Family Communication:Mother-Tara [place] [phone] updated over the phone on 10/27   Disposition Plan: Status is: Inpatient Remains inpatient appropriate because: Severity of illness   Planned Discharge Destination:Home  Diet: Diet Order             DIET SOFT Room service appropriate? Yes; Fluid consistency: Thin  Diet effective now                     Antimicrobial agents: Anti-infectives (From  admission, onward)    Start     Dose/Rate Route Frequency Ordered Stop   02/08/23 1700  Ampicillin-Sulbactam (UNASYN) 3 g in sodium chloride 0.9 % 100 mL IVPB        3 g 200 mL/hr over 30 Minutes Intravenous Every 6 hours 02/08/23 1058     02/08/23 1015  Ampicillin-Sulbactam (UNASYN) 3 g in sodium chloride 0.9 % 100 mL IVPB        3 g 200 mL/hr over 30 Minutes Intravenous  Once 02/08/23 1007 02/08/23 1046        MEDICATIONS: Scheduled Meds:  chlorhexidine  15 mL Mouth/Throat BID   enoxaparin (LOVENOX) injection  40 mg Subcutaneous Q24H   gabapentin  300 mg Oral TID   guaiFENesin  600 mg Oral BID   Oxcarbazepine  300 mg Oral BID   prazosin  1 mg Oral QHS   sodium chloride flush  10-40 mL Intracatheter Q12H   sodium chloride flush  3 mL Intravenous Q12H   Continuous Infusions:  ampicillin-sulbactam (UNASYN) IV 3 g (02/14/23 0553)   PRN Meds:.acetaminophen **OR** acetaminophen, albuterol, LORazepam, sodium chloride flush   I have personally reviewed following labs and imaging studies  LABORATORY DATA: CBC: Recent Labs  Lab 02/07/23 1048 02/08/23 1038 02/09/23 0333 02/10/23 0336 02/11/23 0620 02/12/23 0354 02/13/23 0457 02/14/23 0433  WBC 18.9* 15.1*   < > 14.7* 19.1* 19.7* 17.9* 13.0*  NEUTROABS 17.8* 12.2*  --   --   --  14.6* 14.3*  --   HGB 13.6 12.1   < > 12.6 13.6 12.7 12.8 12.3  HCT 42.1 37.1   < > 36.9 40.9 37.6 39.0 36.7  MCV 84.5 83.9   < > 81.6 83.1 82.5 83.3 83.2  PLT 364 351   < > 371 439* 416* 420* 414*   < > = values in this interval not displayed.    Basic Metabolic Panel: Recent Labs  Lab 02/07/23 1048 02/08/23 1038 02/09/23 0333 02/10/23 0336 02/11/23 0620  NA 141 138 137 138 139  K 3.7 3.8 3.7 3.4* 3.5  CL 106 108 105 105 106  CO2 20* 20* 20* 22 23  GLUCOSE 96 81 100* 120* 114*  BUN 12 13 12 10 10   CREATININE 0.82 0.78 0.62 0.66 0.82  CALCIUM 9.0 8.3* 8.7* 8.7* 8.8*    GFR: Estimated Creatinine Clearance: 116.6 mL/min (by C-G  formula based on SCr of 0.82 mg/dL).  Liver Function Tests: Recent Labs  Lab 02/08/23 1038  AST 27  ALT 12  ALKPHOS 52  BILITOT 1.7*  PROT 6.6  ALBUMIN 3.1*   No results for input(s): "LIPASE", "AMYLASE" in the last 168 hours. No results for input(s): "AMMONIA" in the last 168 hours.  Coagulation Profile: No results for input(s): "INR", "PROTIME" in the last 168 hours.   Cardiac Enzymes: No results for input(s): "CKTOTAL", "CKMB", "CKMBINDEX", "TROPONINI" in the last 168 hours.  BNP (last 3 results) No results for input(s): "PROBNP" in the last 8760 hours.  Lipid Profile: No results for input(s): "CHOL", "HDL", "LDLCALC", "TRIG", "CHOLHDL", "LDLDIRECT" in the last 72 hours.  Thyroid Function Tests: No results for input(s): "TSH", "T4TOTAL", "FREET4", "T3FREE", "THYROIDAB" in  the last 72 hours.  Anemia Panel: No results for input(s): "VITAMINB12", "FOLATE", "FERRITIN", "TIBC", "IRON", "RETICCTPCT" in the last 72 hours.   Urine analysis: No results found for: "COLORURINE", "APPEARANCEUR", "LABSPEC", "PHURINE", "GLUCOSEU", "HGBUR", "BILIRUBINUR", "KETONESUR", "PROTEINUR", "UROBILINOGEN", "NITRITE", "LEUKOCYTESUR"  Sepsis Labs: Lactic Acid, Venous No results found for: "LATICACIDVEN"  MICROBIOLOGY: Recent Results (from the past 240 hour(s))  Blood culture (routine x 2)     Status: None   Collection Time: 02/06/23  9:17 AM   Specimen: BLOOD RIGHT HAND  Result Value Ref Range Status   Specimen Description BLOOD RIGHT HAND  Final   Special Requests   Final    BOTTLES DRAWN AEROBIC ONLY Blood Culture adequate volume   Culture   Final    NO GROWTH 5 DAYS Performed at [facility], [address] [place], [place] [address]    Report Status 02/11/2023 FINAL  Final  Blood culture (routine x 2)     Status: None   Collection Time: 02/06/23  9:22 AM   Specimen: BLOOD RIGHT HAND  Result Value Ref Range Status   Specimen Description BLOOD RIGHT HAND  Final    Special Requests   Final    BOTTLES DRAWN AEROBIC AND ANAEROBIC Blood Culture adequate volume   Culture   Final    NO GROWTH 5 DAYS Performed at [facility], [address] [place], [place] [address]    Report Status 02/11/2023 FINAL  Final  Fungus Culture With Stain     Status: None (Preliminary result)   Collection Time: 02/06/23 11:23 AM   Specimen: Path fluid; Body Fluid  Result Value Ref Range Status   Fungus Stain Final report  Final    Comment: (NOTE) Performed At: [facility] [address] [place], [place] [id number] Jolene Schimke MD IE:[id number]    Fungus (Mycology) Culture PENDING  Incomplete   Fungal Source ABSCESS  Final    Comment: Performed at [facility], [address], [place], [place] [address]  Aerobic/Anaerobic Culture w Gram Stain (surgical/deep wound)     Status: None   Collection Time: 02/06/23 11:23 AM   Specimen: Path fluid; Body Fluid  Result Value Ref Range Status   Specimen Description ABSCESS  Final   Special Requests trans oral  Final   Gram Stain   Final    FEW WBC SEEN ABUNDANT GRAM POSITIVE COCCI MODERATE GRAM NEGATIVE RODS Performed at [facility], [address], [place], [place] [address]    Culture   Final    FEW STREPTOCOCCUS GROUP F Beta hemolytic streptococci are predictably susceptible to penicillin and other beta lactams. Susceptibility testing not routinely performed. MODERATE EIKENELLA CORRODENS Usually susceptible to penicillin and other beta lactam agents,quinolones,macrolides and tetracyclines. MIXED ANAEROBIC FLORA PRESENT.  CALL LAB IF FURTHER IID REQUIRED.    Report Status 02/11/2023 FINAL  Final  Fungus Culture Result     Status: None   Collection Time: 02/06/23 11:23 AM  Result Value Ref Range Status   Result 1 Comment  Final    Comment: (NOTE) KOH/Calcofluor preparation:  no fungus observed. Performed At: [facility] [address] [place], [place] [id number] Jolene Schimke  MD UK:[id number]   MRSA Next Gen by PCR, Nasal     Status: None   Collection Time: 02/06/23 12:58 PM   Specimen: Nasal Mucosa; Nasal Swab  Result Value Ref Range Status   MRSA by PCR Next Gen NOT DETECTED NOT DETECTED Final  Comment: (NOTE) The GeneXpert MRSA Assay (FDA approved for NASAL specimens only), is one component of a comprehensive MRSA colonization surveillance program. It is not intended to diagnose MRSA infection nor to guide or monitor treatment for MRSA infections. Test performance is not FDA approved in patients less than 71 years old. Performed at [facility], [address], [place], [place] [address]   Aerobic/Anaerobic Culture w Gram Stain (surgical/deep wound)     Status: None (Preliminary result)   Collection Time: 02/12/23  5:09 PM   Specimen: PATH ENT biopsy; Tissue  Result Value Ref Range Status   Specimen Description WOUND  Final   Special Requests LEFT PERITONSILAR  Final   Gram Stain   Final    FEW WBC PRESENT, PREDOMINANTLY PMN NO ORGANISMS SEEN    Culture   Final    CULTURE REINCUBATED FOR BETTER GROWTH Performed at [facility], [address], [place], [place] [address]    Report Status PENDING  Incomplete    RADIOLOGY STUDIES/RESULTS: DG CHEST PORT 1 VIEW  Result Date: 02/12/2023 CLINICAL DATA:  Previous right retropharyngeal abscess incision and drainage with tracheostomy tube placement, shortness of breath EXAM: PORTABLE CHEST 1 VIEW COMPARISON:  CT neck 02/12/2023 FINDINGS: There continues to be pneumomediastinum as well as gas tracking in the neck and upper shoulder subcutaneous regions bilaterally. No pneumothorax is observed. Tracheostomy tube appears satisfactorily positioned. The lungs appear clear. Heart size within normal limits for AP projection. No significant bony abnormality observed. IMPRESSION: 1. Persistent pneumomediastinum and gas tracking in the neck and upper shoulder subcutaneous regions bilaterally. 2.  Tracheostomy tube appears satisfactorily positioned over the tracheal air shadow. 3. No pneumothorax is observed. Electronically Signed   By: Gaylyn Rong M.D.   On: 02/12/2023 20:01   CT SOFT TISSUE NECK W CONTRAST  Result Date: 02/12/2023 CLINICAL DATA:  Right retropharyngeal abscess requiring incision and drainage and tracheostomy tube presented back to the hospital for shortness of breath. EXAM: CT NECK WITH CONTRAST TECHNIQUE: Multidetector CT imaging of the neck was performed using the standard protocol following the bolus administration of intravenous contrast. RADIATION DOSE REDUCTION: This exam was performed according to the departmental dose-optimization program which includes automated exposure control, adjustment of the mA and/or kV according to patient size and/or use of iterative reconstruction technique. CONTRAST:  75mL OMNIPAQUE IOHEXOL 350 MG/ML SOLN COMPARISON:  CT neck 02/06/2023 FINDINGS: Pharynx and larynx: There is mucosal edema and enhancement throughout the nasopharynx and oropharynx. There is a persistent right peritonsillar/masticator space abscess measuring up to approximately 2.2 cm AP x 1.1 cm TV x 1.9 cm cc, decreased in size compared to the prior study from 02/06/2023 (3-53, 4-47). The collection extends from the region of the right mandibular molar extraction site through the parapharyngeal space to the oropharyngeal mucosa and superiorly towards the nasopharynx. There is persistent near-complete effacement of the oropharyngeal airway. The larynx is unremarkable.  The epiglottis is normal. There is extensive retropharyngeal gas and subcutaneous emphysema throughout the remainder of the neck extending into the mediastinum and anterior chest wall. The gas is new since the prior study. Salivary glands: The parotid and submandibular glands are unremarkable. Thyroid: Unremarkable. Lymph nodes: Previously seen reactive cervical lymphadenopathy has decreased. Vascular: The major  vasculature of the neck is unremarkable. Limited intracranial: Unremarkable. Visualized orbits: Unremarkable. Mastoids and visualized paranasal sinuses: There is mild mucosal thickening in the maxillary sinuses. The mastoid air cells and middle ear cavities are clear. Skeleton: There is no  acute osseous abnormality or suspicious osseous lesion. Upper chest: The tracheostomy tube tip is in the midthoracic trachea. There are patchy opacities in the right upper and lower lobes and left upper lobe which are new since the prior study (3-89, 116, 124). Other: None. IMPRESSION: 1. The right masticator space/peritonsillar abscess is decreased in size but not resolved since the study from 02/06/2023 following incision and drainage, with persistent surrounding mucosal edema and near-complete effacement of the oropharyngeal airway. 2. Decreased reactive cervical lymphadenopathy. 3. Extensive soft tissue gas throughout the neck extending into the upper mediastinum and anterior chest wall. This may be iatrogenic following incision and drainage or related to positive pressure ventilation. 4. New patchy opacities in both imaged lungs, likely infectious/inflammatory. Electronically Signed   By: Lesia Hausen M.D.   On: 02/12/2023 14:39     LOS: 6 days   Jeoffrey Massed, MD  [facility]    To contact the attending provider between 7A-7P or the covering provider during after hours 7P-7A, please log into the web site [email or web address] and access using universal [place] password for that web site. If you do not have the password, please call the hospital operator.  02/14/2023, 9:40 AM

## 2023-02-14 NOTE — Plan of Care (Signed)
  Problem: Education: Goal: Knowledge of General Education information will improve Description: Including pain rating scale, medication(s)/side effects and non-pharmacologic comfort measures Outcome: Progressing   Problem: Health Behavior/Discharge Planning: Goal: Ability to manage health-related needs will improve Outcome: Progressing   Problem: Coping: Goal: Level of anxiety will decrease Outcome: Progressing   Problem: Education: Goal: Ability to state activities that reduce stress will improve Outcome: Progressing   Problem: Self-Concept: Goal: Ability to identify factors that promote anxiety will improve Outcome: Progressing Goal: Level of anxiety will decrease Outcome: Progressing Goal: Ability to modify response to factors that promote anxiety will improve Outcome: Progressing

## 2023-02-14 NOTE — Plan of Care (Signed)
  Problem: Education: Goal: Knowledge of General Education information will improve Description: Including pain rating scale, medication(s)/side effects and non-pharmacologic comfort measures Outcome: Progressing   Problem: Health Behavior/Discharge Planning: Goal: Ability to manage health-related needs will improve Outcome: Progressing   Problem: Clinical Measurements: Goal: Ability to maintain clinical measurements within normal limits will improve Outcome: Progressing Goal: Will remain free from infection Outcome: Progressing Goal: Diagnostic test results will improve Outcome: Progressing Goal: Respiratory complications will improve Outcome: Progressing Goal: Cardiovascular complication will be avoided Outcome: Progressing   Problem: Activity: Goal: Risk for activity intolerance will decrease Outcome: Progressing   Problem: Nutrition: Goal: Adequate nutrition will be maintained Outcome: Progressing   Problem: Coping: Goal: Level of anxiety will decrease Outcome: Progressing   Problem: Elimination: Goal: Will not experience complications related to bowel motility Outcome: Progressing Goal: Will not experience complications related to urinary retention Outcome: Progressing   Problem: Safety: Goal: Ability to remain free from injury will improve Outcome: Progressing   Problem: Education: Goal: Ability to state activities that reduce stress will improve Outcome: Progressing   Problem: Coping: Goal: Ability to identify and develop effective coping behavior will improve Outcome: Progressing

## 2023-02-15 ENCOUNTER — Other Ambulatory Visit (HOSPITAL_COMMUNITY): Payer: Self-pay

## 2023-02-15 DIAGNOSIS — F4310 Post-traumatic stress disorder, unspecified: Secondary | ICD-10-CM | POA: Diagnosis not present

## 2023-02-15 DIAGNOSIS — Z930 Tracheostomy status: Secondary | ICD-10-CM | POA: Diagnosis not present

## 2023-02-15 DIAGNOSIS — J390 Retropharyngeal and parapharyngeal abscess: Secondary | ICD-10-CM | POA: Diagnosis not present

## 2023-02-15 LAB — CBC
HCT: 40.7 % (ref 36.0–46.0)
Hemoglobin: 13.2 g/dL (ref 12.0–15.0)
MCH: 27.4 pg (ref 26.0–34.0)
MCHC: 32.4 g/dL (ref 30.0–36.0)
MCV: 84.4 fL (ref 80.0–100.0)
Platelets: 452 10*3/uL — ABNORMAL HIGH (ref 150–400)
RBC: 4.82 MIL/uL (ref 3.87–5.11)
RDW: 13.3 % (ref 11.5–15.5)
WBC: 12.9 10*3/uL — ABNORMAL HIGH (ref 4.0–10.5)
nRBC: 0 % (ref 0.0–0.2)

## 2023-02-15 MED ORDER — AMOXICILLIN-POT CLAVULANATE 875-125 MG PO TABS
1.0000 | ORAL_TABLET | Freq: Two times a day (BID) | ORAL | 0 refills | Status: AC
  Filled 2023-02-15: qty 24, 12d supply, fill #0

## 2023-02-15 MED ORDER — AMOXICILLIN-POT CLAVULANATE 875-125 MG PO TABS
1.0000 | ORAL_TABLET | Freq: Two times a day (BID) | ORAL | Status: DC
  Administered 2023-02-15: 1 via ORAL
  Filled 2023-02-15: qty 1

## 2023-02-15 MED ORDER — PRAZOSIN HCL 1 MG PO CAPS
1.0000 mg | ORAL_CAPSULE | Freq: Every day | ORAL | 0 refills | Status: AC
  Filled 2023-02-15: qty 30, 30d supply, fill #0

## 2023-02-15 NOTE — TOC Transition Note (Signed)
Transition of Care [facility]) - CM/SW Discharge Note   Patient Details  Name: Cheryl Tran MRN: [id number] Date of Birth: 05-11-1990  Transition of Care [facility]) CM/SW Contact:  Gordy Clement, RN Phone Number: 02/15/2023, 3:53 PM   Clinical Narrative:     Patient ready for DC.  Suction kit delivered for transport home. Additional supplies provided (1 weeks worth) and additional supplies ordered to be delivered to the home from [employer].  S/O Ronaldo Miyamoto to transport home   Patient  has verbalized understanding of attending follow up appointments as directed on AVS      Barriers to Discharge: Continued Medical Work up, Patient left Against Medical Advice (AMA), Psych Bed not available (new trach and requiring psych placement)   Patient Goals and CMS Choice      Discharge Placement                         Discharge Plan and Services Additional resources added to the After Visit Summary for   In-house Referral: Clinical Social Work                                   Social Determinants of Health (SDOH) Interventions SDOH Screenings   Food Insecurity: No Food Insecurity (02/08/2023)  Housing: Patient Declined (02/08/2023)  Transportation Needs: No Transportation Needs (02/08/2023)  Utilities: Not At Risk (02/08/2023)  Alcohol Screen: Low Risk  (05/12/2022)  Depression (PHQ2-9): High Risk (07/08/2022)  Financial Resource Strain: Medium Risk (05/12/2022)  Physical Activity: Inactive (05/12/2022)  Social Connections: Unknown (10/06/2022)   Received from Ronnald Collum  Stress: Stress Concern Present (05/12/2022)  Tobacco Use: High Risk (02/12/2023)     Readmission Risk Interventions     No data to display

## 2023-02-15 NOTE — Progress Notes (Signed)
Speech Language Pathology Treatment: [name] Speaking valve  Patient Details Name: Cheryl Tran MRN: [id number] DOB: May 20, 1990 Today's Date: 02/15/2023 Time: 6440-3474 SLP Time Calculation (min) (ACUTE ONLY): 9 min  Assessment / Plan / Recommendation Clinical Impression  Cheryl Tran is excited to D/C home today.  She is independent with PMV use, cleaning, and understands precautions re: removal before sleeping.  Voice quality is excellent; tolerates valve without difficulty. She has a f/u appt scheduled with Dr. Irene Pap for decannulation in two weeks. No SLP f/u is needed given independence with valve use.  SLP will sign off.   HPI HPI: Patient is a 32 y.o. female with history of depression-asthma-who was recently hospitalized from 10/19-10/21 on the PCCM service for right retropharyngeal abscess requiring I&D and tracheotomy-she unfortunately signed out AMA and came back to the hospital for shortness of breath-she was then admitted to the hospitalist service. Trach 10/19 by ENT Dr. Irene Pap.  10/19>> CT neck: Extensive soft tissue infection arising from one of the right sided molar extraction sites with right parapharyngeal/masticator space abscess. 10/24>> CT neck:  right masticator space/peritonsillar abscess is decreased in size but not resolved since the study from 02/06/2023 following incision and drainage, with persistent surrounding mucosal edema and near-complete effacement of the oropharyngeal airway.      SLP Plan  All goals met      Recommendations for follow up therapy are one component of a multi-disciplinary discharge planning process, led by the attending physician.  Recommendations may be updated based on patient status, additional functional criteria and insurance authorization.    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 [name], MA CCC/SLP Clinical Specialist - Acute Care SLP Acute Rehabilitation Services Office number [phone]  [name]  02/15/2023, 2:23 PM

## 2023-02-15 NOTE — Plan of Care (Signed)
  Problem: Education: Goal: Knowledge of General Education information will improve Description: Including pain rating scale, medication(s)/side effects and non-pharmacologic comfort measures Outcome: Progressing   Problem: Health Behavior/Discharge Planning: Goal: Ability to manage health-related needs will improve Outcome: Progressing   Problem: Clinical Measurements: Goal: Ability to maintain clinical measurements within normal limits will improve Outcome: Progressing Goal: Will remain free from infection Outcome: Progressing Goal: Diagnostic test results will improve Outcome: Progressing Goal: Respiratory complications will improve Outcome: Progressing Goal: Cardiovascular complication will be avoided Outcome: Progressing   Problem: Activity: Goal: Risk for activity intolerance will decrease Outcome: Progressing   Problem: Nutrition: Goal: Adequate nutrition will be maintained Outcome: Progressing   Problem: Coping: Goal: Level of anxiety will decrease Outcome: Progressing   Problem: Elimination: Goal: Will not experience complications related to bowel motility Outcome: Progressing Goal: Will not experience complications related to urinary retention Outcome: Progressing   Problem: Pain Managment: Goal: General experience of comfort will improve Outcome: Progressing   Problem: Safety: Goal: Ability to remain free from injury will improve Outcome: Progressing   Problem: Skin Integrity: Goal: Risk for impaired skin integrity will decrease Outcome: Progressing   Problem: Education: Goal: Ability to state activities that reduce stress will improve Outcome: Progressing   Problem: Coping: Goal: Ability to identify and develop effective coping behavior will improve Outcome: Progressing   Problem: Self-Concept: Goal: Ability to identify factors that promote anxiety will improve Outcome: Progressing Goal: Level of anxiety will decrease Outcome:  Progressing Goal: Ability to modify response to factors that promote anxiety will improve Outcome: Progressing   

## 2023-02-15 NOTE — Progress Notes (Signed)
Per MD, IVC to be rescinded. CSW uploaded signed Commitment Change form to [facility] (Envelope #[id number]).   Joaquin Courts, MSW, [facility]

## 2023-02-15 NOTE — TOC Progression Note (Addendum)
Transition of Care [facility]) - Progression Note    Patient Details  Name: Cheryl Tran MRN: [id number] Date of Birth: Aug 30, 1990  Transition of Care [facility]) CM/SW Contact  Gordy Clement, RN Phone Number: 02/15/2023, 11:50 AM  Clinical Narrative:     Update 2:02 PM Patient was able to demonstrate trach care etc to [facility] from RT  Supplies for approximately a week were given to patient and RNCM has ordered additional supplies to go to home from [employer]  Per Ed in RT, there is no humidification needed for this patient - she has the "speaking valve"    Patient has a DC order in for today- Patient has new trach. Patient will be going home with S/O ( boyfriend Ronaldo Miyamoto) who has been banned from the institution for threatening staff. Patient will be taught own trach care by RT at 1:00 today. DC will depend on Patient's ability to demonstrate trach care. Home Health unable to be arranged- New trach and insurance barriers [facility])  CM will get supplies from [employer] to include the mandatory suction to transport home with  TOC will continue to follow patient for any additional discharge needs         Barriers to Discharge: Continued Medical Work up, Patient left Against Medical Advice (AMA), Psych Bed not available (new trach and requiring psych placement)  Expected Discharge Plan and Services In-house Referral: Clinical Social Work     Living arrangements for the past 2 months: Apartment Expected Discharge Date: 02/15/23                                     Social Determinants of Health (SDOH) Interventions SDOH Screenings   Food Insecurity: No Food Insecurity (02/08/2023)  Housing: Patient Declined (02/08/2023)  Transportation Needs: No Transportation Needs (02/08/2023)  Utilities: Not At Risk (02/08/2023)  Alcohol Screen: Low Risk  (05/12/2022)  Depression (PHQ2-9): High Risk (07/08/2022)  Financial Resource Strain: Medium Risk (05/12/2022)  Physical Activity: Inactive (05/12/2022)   Social Connections: Unknown (10/06/2022)   Received from Ronnald Collum  Stress: Stress Concern Present (05/12/2022)  Tobacco Use: High Risk (02/12/2023)    Readmission Risk Interventions     No data to display

## 2023-02-15 NOTE — Discharge Summary (Addendum)
PATIENT DETAILS Name: Cheryl Tran Age: 32 y.o. Sex: female Date of Birth: [birth year] MRN: [id number]. Admitting Physician: Clydie Braun, MD WUJ:[id number], Judeth Cornfield, NP  Admit Date: 02/08/2023 Discharge date: 02/15/2023  Recommendations for Outpatient Follow-up:  Follow up with PCP in 1-2 weeks Please obtain CMP/CBC in one week Please ensure follow-up with ENT  Admitted From:  Home  Disposition: Home   Discharge Condition: good  CODE STATUS:   Code Status: Full Code   Diet recommendation:  Diet Order             Diet general           DIET SOFT Room service appropriate? Yes; Fluid consistency: Thin  Diet effective now                    Brief Summary: Patient is a 32 y.o.  female with history of depression-asthma-who was recently hospitalized from 10/19-10/21 on the PCCM service for right retropharyngeal abscess requiring I&D and tracheotomy-she unfortunately signed out AMA and came back to the hospital for shortness of breath-she was then admitted to the hospitalist service.  See below for further details.   Significant events: 10/19-10/21>> hospitalization for retropharyngeal abscess-s/p tracheotomy/I&D-signed out AMA 10/21>> back to the ED-admit to [facility]   Significant studies: 10/19>> CT neck: Extensive soft tissue infection arising from one of the right sided molar extraction sites with right parapharyngeal/masticator space abscess.   Significant microbiology data: 10/19>> blood culture: No growth 10/19>> retropharyngeal abscess culture: Streptococcus group F, Eikenella   Procedures: 10/25>> I&D of parapharyngeal abscess by ENT   Consults: ENT Psychiatry PCCM  Brief Hospital Course: Upper airway obstruction due to retropharyngeal abscess-s/p I&D and tracheotomy on 10/19 and repeat I&D on 10/24 Clinically improved after repeat I&D on 10/24-leukocytosis continues to downtrend-drain was removed by ENT on 10/28 Per ENT-needs 3 weeks of  Augmentin Will be discharged with tracheostomy-ENT will follow-up in the clinic in attempt decannulation. Tracheostomy education/care completed by RN-patient feels comfortable going home-in fact has been requesting discharge for the past several days.    Depression/PTSD Evaluated by psychiatry-an IVC'd-Per psychiatry-continue IVC until patient is medically stable for discharge-which she is on 10/28. Contacted Child psychotherapist to see if we can resend IVC Continue Tegretol/Neurontin/Minipress.   Follow-up with outpatient psychiatrist/therapist.   Bronchial asthma Stable Bronchodilators   Hypokalemia Repleted   Obesity: Estimated body mass index is 31.27 kg/m as calculated from the following:   Height as of this encounter: 5\' 8"  (1.727 m).   Weight as of this encounter: 93.3 kg.   Discharge Diagnoses:  Principal Problem:   Retropharyngeal abscess Active Problems:   Tracheostomy in place [facility])   Airway obstruction   Suicidal ideations   Asthma   PCOS (polycystic ovarian syndrome)   Tobacco use disorder   Obesity (BMI 30-39.9)   PTSD (post-traumatic stress disorder)   Cluster B personality disorder (HCC)   Pharyngeal abscess   Discharge Instructions:  Activity:  As tolerated  Discharge Instructions     Call MD for:  difficulty breathing, headache or visual disturbances   Complete by: As directed    Call MD for:  persistant nausea and vomiting   Complete by: As directed    Diet general   Complete by: As directed    Increase activity slowly   Complete by: As directed       Allergies as of 02/15/2023   No Known Allergies      Medication List     TAKE  these medications    albuterol 108 (90 Base) MCG/ACT inhaler Commonly known as: VENTOLIN HFA Inhale 2 puffs into the lungs every 6 (six) hours as needed for wheezing or shortness of breath.   albuterol (2.5 MG/3ML) 0.083% nebulizer solution Commonly known as: PROVENTIL Take 3 mLs (2.5 mg total) by  nebulization every 6 (six) hours as needed for wheezing or shortness of breath.   amoxicillin-clavulanate 875-125 MG tablet Commonly known as: AUGMENTIN Take 1 tablet by mouth 2 (two) times daily for 12 days. Take for a total of 21 days (per patient report-she has 9 days worth of supply at home) What changed: additional instructions   gabapentin 300 MG capsule Commonly known as: Neurontin Take 1 capsule (300 mg total) by mouth 3 (three) times daily.   ibuprofen 800 MG tablet Commonly known as: ADVIL Take 800 mg by mouth daily as needed for headache or moderate pain (pain score 4-6).   metFORMIN 500 MG tablet Commonly known as: GLUCOPHAGE Take 1 tablet (500 mg total) by mouth 2 (two) times daily with a meal.   Oxcarbazepine 300 MG tablet Commonly known as: TRILEPTAL Take 1 tablet (300 mg total) by mouth 2 (two) times daily.   prazosin 1 MG capsule Commonly known as: MINIPRESS Take 1 capsule (1 mg total) by mouth at bedtime.   Symbicort 80-4.5 MCG/ACT inhaler Generic drug: budesonide-formoterol Inhale 2 puffs into the lungs 2 (two) times daily. What changed:  when to take this reasons to take this        No Known Allergies   Other Procedures/Studies: DG CHEST PORT 1 VIEW  Result Date: 02/12/2023 CLINICAL DATA:  Previous right retropharyngeal abscess incision and drainage with tracheostomy tube placement, shortness of breath EXAM: PORTABLE CHEST 1 VIEW COMPARISON:  CT neck 02/12/2023 FINDINGS: There continues to be pneumomediastinum as well as gas tracking in the neck and upper shoulder subcutaneous regions bilaterally. No pneumothorax is observed. Tracheostomy tube appears satisfactorily positioned. The lungs appear clear. Heart size within normal limits for AP projection. No significant bony abnormality observed. IMPRESSION: 1. Persistent pneumomediastinum and gas tracking in the neck and upper shoulder subcutaneous regions bilaterally. 2. Tracheostomy tube appears  satisfactorily positioned over the tracheal air shadow. 3. No pneumothorax is observed. Electronically Signed   By: Gaylyn Rong M.D.   On: 02/12/2023 20:01   CT SOFT TISSUE NECK W CONTRAST  Result Date: 02/12/2023 CLINICAL DATA:  Right retropharyngeal abscess requiring incision and drainage and tracheostomy tube presented back to the hospital for shortness of breath. EXAM: CT NECK WITH CONTRAST TECHNIQUE: Multidetector CT imaging of the neck was performed using the standard protocol following the bolus administration of intravenous contrast. RADIATION DOSE REDUCTION: This exam was performed according to the departmental dose-optimization program which includes automated exposure control, adjustment of the mA and/or kV according to patient size and/or use of iterative reconstruction technique. CONTRAST:  75mL OMNIPAQUE IOHEXOL 350 MG/ML SOLN COMPARISON:  CT neck 02/06/2023 FINDINGS: Pharynx and larynx: There is mucosal edema and enhancement throughout the nasopharynx and oropharynx. There is a persistent right peritonsillar/masticator space abscess measuring up to approximately 2.2 cm AP x 1.1 cm TV x 1.9 cm cc, decreased in size compared to the prior study from 02/06/2023 (3-53, 4-47). The collection extends from the region of the right mandibular molar extraction site through the parapharyngeal space to the oropharyngeal mucosa and superiorly towards the nasopharynx. There is persistent near-complete effacement of the oropharyngeal airway. The larynx is unremarkable.  The epiglottis is normal. There  is extensive retropharyngeal gas and subcutaneous emphysema throughout the remainder of the neck extending into the mediastinum and anterior chest wall. The gas is new since the prior study. Salivary glands: The parotid and submandibular glands are unremarkable. Thyroid: Unremarkable. Lymph nodes: Previously seen reactive cervical lymphadenopathy has decreased. Vascular: The major vasculature of the neck is  unremarkable. Limited intracranial: Unremarkable. Visualized orbits: Unremarkable. Mastoids and visualized paranasal sinuses: There is mild mucosal thickening in the maxillary sinuses. The mastoid air cells and middle ear cavities are clear. Skeleton: There is no acute osseous abnormality or suspicious osseous lesion. Upper chest: The tracheostomy tube tip is in the midthoracic trachea. There are patchy opacities in the right upper and lower lobes and left upper lobe which are new since the prior study (3-89, 116, 124). Other: None. IMPRESSION: 1. The right masticator space/peritonsillar abscess is decreased in size but not resolved since the study from 02/06/2023 following incision and drainage, with persistent surrounding mucosal edema and near-complete effacement of the oropharyngeal airway. 2. Decreased reactive cervical lymphadenopathy. 3. Extensive soft tissue gas throughout the neck extending into the upper mediastinum and anterior chest wall. This may be iatrogenic following incision and drainage or related to positive pressure ventilation. 4. New patchy opacities in both imaged lungs, likely infectious/inflammatory. Electronically Signed   By: Lesia Hausen M.D.   On: 02/12/2023 14:39   DG Chest Port 1 View  Result Date: 02/08/2023 CLINICAL DATA:  Shortness of breath.  Smoker. EXAM: PORTABLE CHEST 1 VIEW COMPARISON:  02/06/2023. FINDINGS: Since the prior study, patient underwent tracheostomy tube placement. The tip of which is approximately 4.8 cm above the carina. There is new surgical emphysema along the right lower neck as well as subtle ill-defined linear lucencies in the right paratracheal region and along the right heart border, for which trace pneumomediastinum can not be excluded on this exam. No pneumothorax seen. Bilateral lung fields are clear. Bilateral costophrenic angles are clear. Normal cardio-mediastinal silhouette. No acute osseous abnormalities. The soft tissues are within normal  limits. IMPRESSION: *Interval tracheostomy tube placement. Possible trace pneumomediastinum. No pneumothorax. Electronically Signed   By: Jules Schick M.D.   On: 02/08/2023 11:27   CT Soft Tissue Neck W Contrast  Result Date: 02/06/2023 CLINICAL DATA:  Right facial swelling and mild fold voice. Recent dental extraction. EXAM: CT NECK WITH CONTRAST TECHNIQUE: Multidetector CT imaging of the neck was performed using the standard protocol following the bolus administration of intravenous contrast. RADIATION DOSE REDUCTION: This exam was performed according to the departmental dose-optimization program which includes automated exposure control, adjustment of the mA and/or kV according to patient size and/or use of iterative reconstruction technique. CONTRAST:  75mL OMNIPAQUE IOHEXOL 350 MG/ML SOLN COMPARISON:  None Available. FINDINGS: Pharynx and larynx: Extending medial from the vicinity of right molar extraction sites is a cleft like collection traversing the right parapharyngeal space and medial masticator space, 3.3 cm in length by 12 mm in thickness on axial images. There is poorly defined tracking of low-density inferiorly as well. Extensive regional inflammation with retropharyngeal effusion or early abscess measuring up to 9 mm in thickness. Preferential right pharyngeal submucosal edema continuing into the right more than left supraglottic larynx and right glottis. Salivary glands: No primary inflammation. Thyroid: Unremarkable Lymph nodes: Reactive thickening of cervical lymph nodes asymmetric to the right. No nodal cavitation. Vascular: No acute finding.  No venous thrombosis. Limited intracranial: Negative Visualized orbits: Negative Mastoids and visualized paranasal sinuses: Essentially clear Skeleton: Empty wisdom tooth sockets superiorly  and inferiorly. Upper chest: Clear apical lungs.  No mediastinitis detected. Prelim sent in epic chat, the patient is in the emergency room currently. IMPRESSION:  Extensive soft tissue infection likely arising from 1 of the right sided molar extraction sites with right parapharyngeal and masticator space abscess. Submucosal edema is pronounced and involves the right more than left pharynx and larynx, including the right vocal fold. Retropharyngeal effusion versus abscess measuring 9 mm in thickness. Electronically Signed   By: Tiburcio Pea M.D.   On: 02/06/2023 08:33   DG Chest 2 View  Result Date: 02/06/2023 CLINICAL DATA:  Shortness of breath. Recent wisdom tooth extraction. EXAM: CHEST - 2 VIEW COMPARISON:  None Available. FINDINGS: Normal heart size and mediastinal contours. No acute infiltrate or edema. No effusion or pneumothorax. No acute osseous findings. Tiny cervical ribs. IMPRESSION: No active cardiopulmonary disease. Electronically Signed   By: Tiburcio Pea M.D.   On: 02/06/2023 04:58     TODAY-DAY OF DISCHARGE:  Subjective:   Rella [place] today has no headache,no chest abdominal pain,no new weakness tingling or numbness, feels much better wants to go home today.   Objective:   Blood pressure (!) 132/110, pulse 85, temperature 98.4 F (36.9 C), temperature source Oral, resp. rate 16, height 5\' 8"  (1.727 m), weight 93.3 kg, last menstrual period 01/20/2023, SpO2 98%. No intake or output data in the 24 hours ending 02/15/23 0924 Filed Weights   02/08/23 0953  Weight: 93.3 kg    Exam: Awake Alert, Oriented *3, No new F.N deficits, Normal affect [place].AT,PERRAL Supple Neck,No JVD, No cervical lymphadenopathy appriciated.  Symmetrical Chest wall movement, Good air movement bilaterally, CTAB RRR,No Gallops,Rubs or new Murmurs, No Parasternal Heave +ve B.Sounds, Abd Soft, Non tender, No organomegaly appriciated, No rebound -guarding or rigidity. No Cyanosis, Clubbing or edema, No new Rash or bruise   PERTINENT RADIOLOGIC STUDIES: No results found.   PERTINENT LAB RESULTS: CBC: Recent Labs    02/13/23 0457 02/14/23 0433  WBC  17.9* 13.0*  HGB 12.8 12.3  HCT 39.0 36.7  PLT 420* 414*   CMET CMP     Component Value Date/Time   NA 139 02/11/2023 0620   K 3.5 02/11/2023 0620   CL 106 02/11/2023 0620   CO2 23 02/11/2023 0620   GLUCOSE 114 (H) 02/11/2023 0620   BUN 10 02/11/2023 0620   CREATININE 0.82 02/11/2023 0620   CALCIUM 8.8 (L) 02/11/2023 0620   PROT 6.6 02/08/2023 1038   ALBUMIN 3.1 (L) 02/08/2023 1038   AST 27 02/08/2023 1038   ALT 12 02/08/2023 1038   ALKPHOS 52 02/08/2023 1038   BILITOT 1.7 (H) 02/08/2023 1038   GFRNONAA >60 02/11/2023 0620    GFR Estimated Creatinine Clearance: 116.6 mL/min (by C-G formula based on SCr of 0.82 mg/dL). No results for input(s): "LIPASE", "AMYLASE" in the last 72 hours. No results for input(s): "CKTOTAL", "CKMB", "CKMBINDEX", "TROPONINI" in the last 72 hours. Invalid input(s): "POCBNP" No results for input(s): "DDIMER" in the last 72 hours. No results for input(s): "HGBA1C" in the last 72 hours. No results for input(s): "CHOL", "HDL", "LDLCALC", "TRIG", "CHOLHDL", "LDLDIRECT" in the last 72 hours. No results for input(s): "TSH", "T4TOTAL", "T3FREE", "THYROIDAB" in the last 72 hours.  Invalid input(s): "FREET3" No results for input(s): "VITAMINB12", "FOLATE", "FERRITIN", "TIBC", "IRON", "RETICCTPCT" in the last 72 hours. Coags: No results for input(s): "INR" in the last 72 hours.  Invalid input(s): "PT" Microbiology: Recent Results (from the past 240 hour(s))  Blood culture (routine  x 2)     Status: None   Collection Time: 02/06/23  9:17 AM   Specimen: BLOOD RIGHT HAND  Result Value Ref Range Status   Specimen Description BLOOD RIGHT HAND  Final   Special Requests   Final    BOTTLES DRAWN AEROBIC ONLY Blood Culture adequate volume   Culture   Final    NO GROWTH 5 DAYS Performed at [facility], [address], [place], [place] [address]    Report Status 02/11/2023 FINAL  Final  Blood culture (routine x 2)     Status: None   Collection Time:  02/06/23  9:22 AM   Specimen: BLOOD RIGHT HAND  Result Value Ref Range Status   Specimen Description BLOOD RIGHT HAND  Final   Special Requests   Final    BOTTLES DRAWN AEROBIC AND ANAEROBIC Blood Culture adequate volume   Culture   Final    NO GROWTH 5 DAYS Performed at [facility], [address], [place], [place] [address]    Report Status 02/11/2023 FINAL  Final  Fungus Culture With Stain     Status: None (Preliminary result)   Collection Time: 02/06/23 11:23 AM   Specimen: Path fluid; Body Fluid  Result Value Ref Range Status   Fungus Stain Final report  Final    Comment: (NOTE) Performed At: [facility] [address] [place], [place] [id number] Jolene Schimke MD LK:[id number]    Fungus (Mycology) Culture PENDING  Incomplete   Fungal Source ABSCESS  Final    Comment: Performed at [facility], [address], [place], [place] [address]  Aerobic/Anaerobic Culture w Gram Stain (surgical/deep wound)     Status: None   Collection Time: 02/06/23 11:23 AM   Specimen: Path fluid; Body Fluid  Result Value Ref Range Status   Specimen Description ABSCESS  Final   Special Requests trans oral  Final   Gram Stain   Final    FEW WBC SEEN ABUNDANT GRAM POSITIVE COCCI MODERATE GRAM NEGATIVE RODS Performed at [facility], [address] [place], [place] [address]    Culture   Final    FEW STREPTOCOCCUS GROUP F Beta hemolytic streptococci are predictably susceptible to penicillin and other beta lactams. Susceptibility testing not routinely performed. MODERATE EIKENELLA CORRODENS Usually susceptible to penicillin and other beta lactam agents,quinolones,macrolides and tetracyclines. MIXED ANAEROBIC FLORA PRESENT.  CALL LAB IF FURTHER IID REQUIRED.    Report Status 02/11/2023 FINAL  Final  Fungus Culture Result     Status: None   Collection Time: 02/06/23 11:23 AM  Result Value Ref Range Status   Result 1 Comment  Final    Comment: (NOTE) KOH/Calcofluor  preparation:  no fungus observed. Performed At: [facility] [address] [place], [place] [id number] Jolene Schimke MD [id number]   MRSA Next Gen by PCR, Nasal     Status: None   Collection Time: 02/06/23 12:58 PM   Specimen: Nasal Mucosa; Nasal Swab  Result Value Ref Range Status   MRSA by PCR Next Gen NOT DETECTED NOT DETECTED Final    Comment: (NOTE) The GeneXpert MRSA Assay (FDA approved for NASAL specimens only), is one component of a comprehensive MRSA colonization surveillance program. It is not intended to diagnose MRSA infection nor to guide or monitor treatment for MRSA infections. Test performance is not FDA approved in patients less than 10 years old. Performed at [facility], [address], [place], [place] [address]  Aerobic/Anaerobic Culture w Gram Stain (surgical/deep wound)     Status: None (Preliminary result)   Collection Time: 02/12/23  5:09 PM   Specimen: PATH ENT biopsy; Tissue  Result Value Ref Range Status   Specimen Description WOUND  Final   Special Requests LEFT PERITONSILAR  Final   Gram Stain   Final    FEW WBC PRESENT, PREDOMINANTLY PMN NO ORGANISMS SEEN Performed at [facility], [address], [place], [place] [address]    Culture   Final    CULTURE REINCUBATED FOR BETTER GROWTH NO ANAEROBES ISOLATED; CULTURE IN PROGRESS FOR 5 DAYS    Report Status PENDING  Incomplete    FURTHER DISCHARGE INSTRUCTIONS:  Get Medicines reviewed and adjusted: Please take all your medications with you for your next visit with your Primary MD  Laboratory/radiological data: Please request your Primary MD to go over all hospital tests and procedure/radiological results at the follow up, please ask your Primary MD to get all Hospital records sent to his/her office.  In some cases, they will be blood work, cultures and biopsy results pending at the time of your discharge. Please request that your primary care M.D. goes through all the  records of your hospital data and follows up on these results.  Also Note the following: If you experience worsening of your admission symptoms, develop shortness of breath, life threatening emergency, suicidal or homicidal thoughts you must seek medical attention immediately by calling 911 or calling your MD immediately  if symptoms less severe.  You must read complete instructions/literature along with all the possible adverse reactions/side effects for all the Medicines you take and that have been prescribed to you. Take any new Medicines after you have completely understood and accpet all the possible adverse reactions/side effects.   Do not drive when taking Pain medications or sleeping medications (Benzodaizepines)  Do not take more than prescribed Pain, Sleep and Anxiety Medications. It is not advisable to combine anxiety,sleep and pain medications without talking with your primary care practitioner  Special Instructions: If you have smoked or chewed Tobacco  in the last 2 yrs please stop smoking, stop any regular Alcohol  and or any Recreational drug use.  Wear Seat belts while driving.  Please note: You were cared for by a hospitalist during your hospital stay. Once you are discharged, your primary care physician will handle any further medical issues. Please note that NO REFILLS for any discharge medications will be authorized once you are discharged, as it is imperative that you return to your primary care physician (or establish a relationship with a primary care physician if you do not have one) for your post hospital discharge needs so that they can reassess your need for medications and monitor your lab values.  Total Time spent coordinating discharge including counseling, education and face to face time equals greater than 30 minutes.  SignedJeoffrey Massed 02/15/2023 9:24 AM

## 2023-02-15 NOTE — Progress Notes (Signed)
ENT Progress Note   Cheryl Tran is an 32 y.o. female with hx of retropharyngeal/masticator space abscess s/p trach and I&D by me 10/19 ans subsequent recollection, now POD#3 s/p I&D by Dr Allena Katz. WBC decreased from 19 to  13 (02/14/23). ID recommends switching to PO Augmentin x 3 weeks. Patient feels that her throat swelling is improved. Phonating well with PMV in place. Tolerating regular diet.    Past Medical History:  Diagnosis Date   Anxiety    Asthma    Bipolar 1 disorder (HCC)    Depression     Past Surgical History:  Procedure Laterality Date   INCISION AND DRAINAGE ABSCESS N/A 02/12/2023   Procedure: INCISION AND DRAINAGE PHARYNGEAL ABSCESS;  Surgeon: Read Drivers, MD;  Location: [facility] OR;  Service: ENT;  Laterality: N/A;   KNEE ARTHROSCOPY WITH ANTERIOR CRUCIATE LIGAMENT (ACL) REPAIR Right 08/18/2022   Procedure: RIGHT KNEE ARTHROSCOPY WITH ANTERIOR CRUCIATE LIGAMENT (ACL) REPAIR;  Surgeon: Huel Cote, MD;  Location: [facility];  Service: Orthopedics;  Laterality: Right;   KNEE ARTHROSCOPY WITH MACI CARTILAGE HARVEST Right 06/22/2022   Procedure: RIGHT KNEE ARTHROSCOPY PATELLA CHONDROPLASTY  WITH MACI CARTILAGE HARVEST;  Surgeon: Huel Cote, MD;  Location: [facility];  Service: Orthopedics;  Laterality: Right;   OSTEOCHONDRAL DEFECT REPAIR/RECONSTRUCTION Right 08/18/2022   Procedure: OSTEOCHONDRAL DEFECT REPAIR/RECONSTRUCTION/MACI IMPLANTATION;  Surgeon: Huel Cote, MD;  Location: [facility];  Service: Orthopedics;  Laterality: Right;   TONSILLECTOMY AND ADENOIDECTOMY N/A 02/06/2023   Procedure: TRANSORAL DRAINAGE ABSCESS;  Surgeon: Ashok Croon, MD;  Location: [facility] OR;  Service: ENT;  Laterality: N/A;   TRACHEOSTOMY TUBE PLACEMENT N/A 02/06/2023   Procedure: FIBEROPTIC INTUBATION WITH TRACHEOSTOMY;  Surgeon: Ashok Croon, MD;  Location: [facility] OR;  Service: ENT;  Laterality: N/A;    Family History  Problem Relation Age  of Onset   Neuropathy Mother    Addison's disease Mother    Hypertension Mother    Pernicious anemia Mother    Hypertension Father    Diabetes Father    Graves' disease Sister    Bipolar disorder Sister    Breast cancer Maternal Grandmother    Hyperthyroidism Maternal Grandmother     Social History:  reports that she has been smoking cigarettes and cigars. She has never used smokeless tobacco. She reports current alcohol use. She reports current drug use. Drug: Marijuana.  Allergies: No Known Allergies  Medications Prior to Admission  Medication Sig Dispense Refill   albuterol (PROVENTIL) (2.5 MG/3ML) 0.083% nebulizer solution Take 3 mLs (2.5 mg total) by nebulization every 6 (six) hours as needed for wheezing or shortness of breath. 90 mL 5   albuterol (VENTOLIN HFA) 108 (90 Base) MCG/ACT inhaler Inhale 2 puffs into the lungs every 6 (six) hours as needed for wheezing or shortness of breath. 18 g 5   amoxicillin-clavulanate (AUGMENTIN) 875-125 MG tablet Take 1 tablet by mouth 2 (two) times daily for 14 days. 20 tablet 0   gabapentin (NEURONTIN) 300 MG capsule Take 1 capsule (300 mg total) by mouth 3 (three) times daily. 90 capsule 3   ibuprofen (ADVIL) 800 MG tablet Take 800 mg by mouth daily as needed for headache or moderate pain (pain score 4-6).     metFORMIN (GLUCOPHAGE) 500 MG tablet Take 1 tablet (500 mg total) by mouth 2 (two) times daily with a meal. 180 tablet 1   Oxcarbazepine (TRILEPTAL) 300 MG tablet Take 1 tablet (300 mg total) by mouth 2 (two) times  daily. 60 tablet 1   SYMBICORT 80-4.5 MCG/ACT inhaler Inhale 2 puffs into the lungs 2 (two) times daily. (Patient taking differently: Inhale 2 puffs into the lungs 2 (two) times daily as needed (wheezing, shortness of breath).) 10.2 g 5    Results for orders placed or performed during the hospital encounter of 02/08/23 (from the past 48 hour(s))  CBC     Status: Abnormal   Collection Time: 02/14/23  4:33 AM  Result Value  Ref Range   WBC 13.0 (H) 4.0 - 10.5 K/uL   RBC 4.41 3.87 - 5.11 MIL/uL   Hemoglobin 12.3 12.0 - 15.0 g/dL   HCT 13.0 86.5 - 78.4 %   MCV 83.2 80.0 - 100.0 fL   MCH 27.9 26.0 - 34.0 pg   MCHC 33.5 30.0 - 36.0 g/dL   RDW 69.6 29.5 - 28.4 %   Platelets 414 (H) 150 - 400 K/uL   nRBC 0.0 0.0 - 0.2 %    Comment: Performed at [facility], [address], [place], [place] [address]   No results found.  ROS: ROS  Blood pressure (!) 140/95, pulse 82, temperature 98.4 F (36.9 C), temperature source Oral, resp. rate 16, height 5\' 8"  (1.727 m), weight 93.3 kg, last menstrual period 01/20/2023, SpO2 96%.  PHYSICAL EXAM: Right posterior pharyngeal swelling appears much better today, Penrose placed during last I&D removed during exam No purulent drainage from the the site of Penrose drain placement Good mouth opening  6-0 cuffless Shiley in place, PMV in place, good phonation during evaluation No SQ emphysema along the neck on exam, stoma c/d/i    Studies Reviewed:  IMPRESSION: 1. Persistent pneumomediastinum and gas tracking in the neck and upper shoulder subcutaneous regions bilaterally. 2. Tracheostomy tube appears satisfactorily positioned over the tracheal air shadow. 3. No pneumothorax is observed.     Assessment/Plan Retropharyngeal/parapharyngeal abscess following right upper and lower molar extraction, s/p trach and I&D x 2, doing well, WBC 13 yesterday  - abx per ID  - ok to go home today, with trach and supplies for trach care - trach care teaching with RN today - Penrose removed during rounds  - f/u with me outpatient in 2 weeks - will plan to decannulate in the office    Cheryl Tran 02/15/2023, 8:52 AM

## 2023-02-15 NOTE — Consult Note (Signed)
  Patient is discharging home today. Psychiatry consult service has no barriers to discharge at this time. IVC has been rescinded by primary team. Psychiatry consult service to sign off at this time.

## 2023-02-15 NOTE — Progress Notes (Signed)
[facility]  Date of Admission:  02/08/2023   Total days of inpatient antibiotics 7  Principal Problem:   Retropharyngeal abscess Active Problems:   Asthma   PCOS (polycystic ovarian syndrome)   PTSD (post-traumatic stress disorder)   Tracheostomy in place [facility])   Airway obstruction   Suicidal ideations   Tobacco use disorder   Obesity (BMI 30-39.9)   Cluster B personality disorder (HCC)   Pharyngeal abscess          Assessment: 32 year old female with PCOS, tobacco abuse, recent admission for paraharyngeal masticator space abscess status post I&D on 10/19 admitted with: #Parapharyngeal masticator space abscess status post tracheostomy and transoral incision and drainage with ENT on 10/19 - Patient was initially on 6 admitted 02/05/09/24 days following wisdom tooth extraction led by difficulty swallowing. - Found to have parapharyngeal, masticator space abscess, retropharyngeal abscess requiring tracheostomy and transoral I&D on 10/19 with ENT - Per OR note purulent drainage noted in pharyngeal wall.  Patient left AMA.  Return with fevers.  Unasyn was restarted, ENT noted repeat CT today to see need for further intervention. -Taken to the OR on 10/25 for repeat I&D transoral parapharyngeal masticator space abscess, trach change.  Per OR note, on gingival incision purulent fluid was encountered and cultured.  Penrose drain removed. Recommendations: -Discontinue Unasyn - Start Augmentin - Follow-up or cultures - Anticipate at least 3 weeks of antibiotics from OR on 10/25 EOT 11/14 - Follow-up with infectious disease on 11/14 myself - Follow-up with ENT - Does not need midline from infectious disease perspective please remove. ID will sign off Microbiology:   Antibiotics: Unasyn 10/19-present   Cultures: Blood 10/19 no growth Urine   Other Or cultures on 10/19 group F Str OR cultures from 10/25 reincubating  SUBJECTIVE: Sitting in  bed.  No new complaints. Interval: Tmax 99.2 overnight.  WBC 12.9 K.  Review of Systems: Review of Systems  All other systems reviewed and are negative.    Scheduled Meds:  amoxicillin-clavulanate  1 tablet Oral Q12H   chlorhexidine  15 mL Mouth/Throat BID   enoxaparin (LOVENOX) injection  40 mg Subcutaneous Q24H   gabapentin  300 mg Oral TID   guaiFENesin  600 mg Oral BID   Oxcarbazepine  300 mg Oral BID   prazosin  1 mg Oral QHS   sodium chloride flush  10-40 mL Intracatheter Q12H   sodium chloride flush  3 mL Intravenous Q12H   Continuous Infusions: PRN Meds:.acetaminophen **OR** acetaminophen, albuterol, LORazepam, sodium chloride flush No Known Allergies  OBJECTIVE: Vitals:   02/14/23 2331 02/15/23 0319 02/15/23 0410 02/15/23 0850  BP:   (!) 140/95 (!) 132/110  Pulse: 78 81 82 85  Resp: 16 19 16 16   Temp:   98.4 F (36.9 C)   TempSrc:   Oral   SpO2: 100% 96% 96%   Weight:      Height:       Body mass index is 31.27 kg/m.  Physical Exam Constitutional:      Appearance: Normal appearance.  HENT:     Head: Normocephalic and atraumatic.     Right Ear: Tympanic membrane normal.     Left Ear: Tympanic membrane normal.     Nose: Nose normal.     Mouth/Throat:     Mouth: Mucous membranes are moist.     Comments: Oral wound. Eyes:     Extraocular Movements: Extraocular movements intact.  Conjunctiva/sclera: Conjunctivae normal.     Pupils: Pupils are equal, round, and reactive to light.  Cardiovascular:     Rate and Rhythm: Normal rate and regular rhythm.     Heart sounds: No murmur heard.    No friction rub. No gallop.  Pulmonary:     Effort: Pulmonary effort is normal.     Breath sounds: Normal breath sounds.  Abdominal:     General: Abdomen is flat.     Palpations: Abdomen is soft.  Musculoskeletal:        General: Normal range of motion.  Skin:    General: Skin is warm and dry.  Neurological:     General: No focal deficit present.     Mental  Status: She is alert and oriented to person, place, and time.  Psychiatric:        Mood and Affect: Mood normal.       Lab Results Lab Results  Component Value Date   WBC 12.9 (H) 02/15/2023   HGB 13.2 02/15/2023   HCT 40.7 02/15/2023   MCV 84.4 02/15/2023   PLT 452 (H) 02/15/2023    Lab Results  Component Value Date   CREATININE 0.82 02/11/2023   BUN 10 02/11/2023   NA 139 02/11/2023   K 3.5 02/11/2023   CL 106 02/11/2023   CO2 23 02/11/2023    Lab Results  Component Value Date   ALT 12 02/08/2023   AST 27 02/08/2023   ALKPHOS 52 02/08/2023   BILITOT 1.7 (H) 02/08/2023        Danelle Earthly, MD [facility] 02/15/2023, 10:40 AM I have personally spent 54 minutes involved in face-to-face and non-face-to-face activities for this patient on the day of the visit. Professional time spent includes the following activities: Preparing to see the patient (review of tests), Obtaining and/or reviewing separately obtained history (admission/discharge record), Performing a medically appropriate examination and/or evaluation , Ordering medications/tests/procedures, referring and communicating with other health care professionals, Documenting clinical information in the EMR, Independently interpreting results (not separately reported), Communicating results to the patient/family/caregiver, Counseling and educating the patient/family/caregiver and Care coordination (not separately reported).

## 2023-02-15 NOTE — Progress Notes (Signed)
Respiratory/ Pulmonary Lab Note:  RT at beside to educate and teach trach cleaning and maintenance for home discharge.  Patient was able to show return demonstration  with cleaning around trach site, changing inner cannula, suctioning herself and  and placing of trach drain gauze as well as 2 man technique changing of trach collar  Patient verbalized how to cleaning inner cannula if needed.  Patient was given  a weeks worth of trach supplies and an extra new trach to take home.  Patient was given an education booklet and information for follow-up to the trach client.  Patient has been instructed to call Resp. Dept at [phone] once she gets home to be seen in clinic within the next two weeks.

## 2023-02-16 ENCOUNTER — Telehealth: Payer: Self-pay

## 2023-02-16 NOTE — Transitions of Care (Post Inpatient/ED Visit) (Signed)
02/16/2023  Name: Cheryl Tran MRN: [id number] DOB: 1991/03/23  Today's TOC FU Call Status: Today's TOC FU Call Status:: Successful TOC FU Call Completed TOC FU Call Complete Date: 02/16/23 Patient's Name and Date of Birth confirmed.  Transition Care Management Follow-up Telephone Call Date of Discharge: 02/15/23 Discharge Facility: [name]) Type of Discharge: Inpatient Admission Primary Inpatient Discharge Diagnosis:: retropharyngeal abscess How have you been since you were released from the hospital?: Better  Items Reviewed: Did you receive and understand the discharge instructions provided?: No Medications obtained,verified, and reconciled?: Yes (Medications Reviewed) Any new allergies since your discharge?: No Dietary orders reviewed?: Yes Do you have support at home?: Yes People in Home: significant other  Medications Reviewed Today: Medications Reviewed Today     Reviewed by Karena Addison, LPN (Licensed Practical Nurse) on 02/16/23 at 1014  Med List Status: <None>   Medication Order Taking? Sig Documenting Provider Last Dose Status Informant  albuterol (PROVENTIL) (2.5 MG/3ML) 0.083% nebulizer solution [id number] No Take 3 mLs (2.5 mg total) by nebulization every 6 (six) hours as needed for wheezing or shortness of breath. Dulce Sellar, NP 02/08/2023 Active Self, Pharmacy Records  albuterol (VENTOLIN HFA) 108 (90 Base) MCG/ACT inhaler [id number] No Inhale 2 puffs into the lungs every 6 (six) hours as needed for wheezing or shortness of breath. Dulce Sellar, NP UNK Active Self, Pharmacy Records  amoxicillin-clavulanate (AUGMENTIN) 875-125 MG tablet [id number]  Take 1 tablet by mouth 2 (two) times daily for 12 days. Take for a total of 21 days (per patient report-she has 9 days worth of supply at home) Maretta Bees, MD  Active   gabapentin (NEURONTIN) 300 MG capsule [id number] No Take 1 capsule (300 mg total) by mouth 3 (three) times daily. Shanna Cisco, NP 02/07/2023 Active Self, Pharmacy Records  ibuprofen (ADVIL) 800 MG tablet [id number] No Take 800 mg by mouth daily as needed for headache or moderate pain (pain score 4-6). [provider] Past Week Active Self, Pharmacy Records  metFORMIN (GLUCOPHAGE) 500 MG tablet [id number] No Take 1 tablet (500 mg total) by mouth 2 (two) times daily with a meal. Wyline Beady A, NP 02/07/2023 Active Self, Pharmacy Records  Oxcarbazepine (TRILEPTAL) 300 MG tablet [id number] No Take 1 tablet (300 mg total) by mouth 2 (two) times daily.  02/07/2023 Active Self, Pharmacy Records           Med Note (COFFELL, Marzella Schlein   Mon Feb 08, 2023 11:18 AM)    prazosin (MINIPRESS) 1 MG capsule [id number]  Take 1 capsule (1 mg total) by mouth at bedtime. Maretta Bees, MD  Active   SYMBICORT 80-4.5 MCG/ACT inhaler [id number] No Inhale 2 puffs into the lungs 2 (two) times daily.  Patient taking differently: Inhale 2 puffs into the lungs 2 (two) times daily as needed (wheezing, shortness of breath).   Dulce Sellar, NP UNK Active Self, Pharmacy Records            Home Care and Equipment/Supplies: Were Home Health Services Ordered?: NA Any new equipment or medical supplies ordered?: Yes Name of Medical supply agency?: unknown Were you able to get the equipment/medical supplies?: No Do you have any questions related to the use of the equipment/supplies?: No  Functional Questionnaire: Do you need assistance with bathing/showering or dressing?: No Do you need assistance with meal preparation?: No Do you need assistance with eating?: No Do you have difficulty maintaining continence: No Do you need assistance with getting out of bed/getting  out of a chair/moving?: No Do you have difficulty managing or taking your medications?: No  Follow up appointments reviewed: PCP Follow-up appointment confirmed?: Yes Date of PCP follow-up appointment?: 02/18/23 Follow-up Provider: [facility] Follow-up appointment confirmed?: Yes Date of Specialist follow-up appointment?: 02/25/23 Follow-Up Specialty Provider:: surgeon Do you need transportation to your follow-up appointment?: No Do you understand care options if your condition(s) worsen?: Yes-patient verbalized understanding    SIGNATURE Karena Addison, LPN [facility] Nurse Health Advisor Direct Dial [phone]

## 2023-02-17 ENCOUNTER — Telehealth: Payer: Self-pay

## 2023-02-17 LAB — AEROBIC/ANAEROBIC CULTURE W GRAM STAIN (SURGICAL/DEEP WOUND): Culture: NORMAL

## 2023-02-17 NOTE — Transitions of Care (Post Inpatient/ED Visit) (Signed)
02/17/2023  Name: Cheryl Tran MRN: [id number] DOB: 01-07-1991  Today's TOC FU Call Status: Today's TOC FU Call Status:: Successful TOC FU Call Completed TOC FU Call Complete Date: 02/17/23 Patient's Name and Date of Birth confirmed.  Contacted pt to assess and offer 30-day TOC program. Patient confirms she has [employer] and is followed by case Production designer, theatre/television/film through their services as well as [employer] case Financial controller. She confirms [employer] case manger has contacted her post discharge and will be calling her back in a few days.     Transition Care Management Follow-up Telephone Call Date of Discharge: 02/15/23 Discharge Facility: [name]) Type of Discharge: Inpatient Admission Primary Inpatient Discharge Diagnosis:: "retropharyngeal abscess" How have you been since you were released from the hospital?: Better (pt states she is "doing ok"- no SOB-trach intact-she is doing trach care and has assistance in home if needed, appetite has been very good, has had a BM since coming home) Any questions or concerns?: No  Items Reviewed: Did you receive and understand the discharge instructions provided?: Yes Medications obtained,verified, and reconciled?: Yes (Medications Reviewed) Any new allergies since your discharge?: No Dietary orders reviewed?: Yes Type of Diet Ordered:: low salt/heart healthy Do you have support at home?: Yes People in Home: significant other Name of Support/Comfort Primary Source: Cheryl Tran  Medications Reviewed Today: Medications Reviewed Today     Reviewed by Charlyn Minerva, RN (Registered Nurse) on 02/17/23 at 1425  Med List Status: <None>   Medication Order Taking? Sig Documenting Provider Last Dose Status Informant  albuterol (PROVENTIL) (2.5 MG/3ML) 0.083% nebulizer solution [id number] Yes Take 3 mLs (2.5 mg total) by nebulization every 6 (six) hours as needed for wheezing or shortness of breath. Dulce Sellar, NP Taking Active Self,  Pharmacy Records  albuterol (VENTOLIN HFA) 108 (90 Base) MCG/ACT inhaler [id number] Yes Inhale 2 puffs into the lungs every 6 (six) hours as needed for wheezing or shortness of breath. Dulce Sellar, NP Taking Active Self, Pharmacy Records  amoxicillin-clavulanate (AUGMENTIN) 875-125 MG tablet [id number] Yes Take 1 tablet by mouth 2 (two) times daily for 12 days. Take for a total of 21 days (per patient report-she has 9 days worth of supply at home) Maretta Bees, MD Taking Active   gabapentin (NEURONTIN) 300 MG capsule [id number] Yes Take 1 capsule (300 mg total) by mouth 3 (three) times daily. Shanna Cisco, NP Taking Active Self, Pharmacy Records  ibuprofen (ADVIL) 800 MG tablet [id number] Yes Take 800 mg by mouth daily as needed for headache or moderate pain (pain score 4-6). [provider] Taking Active Self, Pharmacy Records  metFORMIN (GLUCOPHAGE) 500 MG tablet [id number]  Take 1 tablet (500 mg total) by mouth 2 (two) times daily with a meal. Olivia Mackie, NP  Active Self, Pharmacy Records           Med Note Jovita Kussmaul, Ellard Artis Feb 17, 2023  2:19 PM) Pt states she is unable to locate med in home-will discuss with PCP during appt tomorrow and get refill on med  Oxcarbazepine (TRILEPTAL) 300 MG tablet [id number] Yes Take 1 tablet (300 mg total) by mouth 2 (two) times daily.  Taking Active Self, Pharmacy Records           Med Note (COFFELL, Marzella Schlein   Mon Feb 08, 2023 11:18 AM)    prazosin (MINIPRESS) 1 MG capsule [id number] Yes Take 1 capsule (1 mg total) by mouth at bedtime. Maretta Bees, MD Taking  Active   SYMBICORT 80-4.5 MCG/ACT inhaler [id number] Yes Inhale 2 puffs into the lungs 2 (two) times daily.  Patient taking differently: Inhale 2 puffs into the lungs 2 (two) times daily as needed (wheezing, shortness of breath).   Dulce Sellar, NP Taking Active Self, Pharmacy Records            Home Care and Equipment/Supplies: Were Home Health  Services Ordered?: NA Any new equipment or medical supplies ordered?: Yes Name of Medical supply agency?: [employer]tr[employer] Were you able to get the equipment/medical supplies?: Yes (pt confirms she has trach supplies in the home) Do you have any questions related to the use of the equipment/supplies?: No  Functional Questionnaire: Do you need assistance with bathing/showering or dressing?: No Do you need assistance with meal preparation?: No Do you need assistance with eating?: No Do you have difficulty maintaining continence: No Do you need assistance with getting out of bed/getting out of a chair/moving?: No Do you have difficulty managing or taking your medications?: No  Follow up appointments reviewed: PCP Follow-up appointment confirmed?: Yes Date of PCP follow-up appointment?: 02/18/23 Follow-up Provider: Glennon Mac Specialist [facility] Follow-up appointment confirmed?: Yes Date of Specialist follow-up appointment?: 02/25/23 Follow-Up Specialty Provider:: Dr. Daine Floras, Dr. Singh-[facility]-03/04/23 Do you need transportation to your follow-up appointment?: No Do you understand care options if your condition(s) worsen?: Yes-patient verbalized understanding  SDOH Interventions Today    Flowsheet Row Most Recent Value  SDOH Interventions   Food Insecurity Interventions Intervention Not Indicated  Transportation Interventions Intervention Not Indicated      Antionette Fairy, RN,BSN,CCM RN Care Manager Transitions of Care  [place]Population [facility]  Direct Phone: [phone] Toll Free: [phone] Fax: [phone]

## 2023-02-17 NOTE — Transitions of Care (Post Inpatient/ED Visit) (Signed)
02/17/2023  Name: Cheryl Tran MRN: [id number] DOB: 08/29/1990  Today's TOC FU Call Status: Today's TOC FU Call Status:: Unsuccessful Call (2nd Attempt) Unsuccessful Call (2nd Attempt) Date: 02/17/23 (Voicemail message received from patient-returning RN CM call. Return call placed to pt.)   Unsuccessful outreach attempt to engage pt for 30-day TOC program.   Follow Up Plan: Additional outreach attempts will be made to reach the patient to complete the Transitions of Care (Post Inpatient/ED visit) call.     Antionette Fairy, RN,BSN,CCM RN Care Manager Transitions of Care  [place]  Direct Phone: [phone] Toll Free: [phone] Fax: [phone]

## 2023-02-17 NOTE — Transitions of Care (Post Inpatient/ED Visit) (Signed)
02/17/2023  Name: Cheryl Tran MRN: [id number] DOB: [birth year]  Today's TOC FU Call Status: Today's TOC FU Call Status:: Unsuccessful Call (1st Attempt) Unsuccessful Call (1st Attempt) Date: 02/17/23  Unsuccessful outreach attempt to engage pt for 30-day TOC program.  Follow Up Plan: Additional outreach attempts will be made to reach the patient to complete the Transitions of Care (Post Inpatient/ED visit) call.     Antionette Fairy, RN,BSN,CCM RN Care Manager Transitions of Care  [place]Population [facility]  Direct Phone: [phone] Toll Free: [phone] Fax: [phone]

## 2023-02-18 ENCOUNTER — Ambulatory Visit: Payer: MEDICAID | Admitting: Family

## 2023-02-18 ENCOUNTER — Encounter: Payer: Self-pay | Admitting: Family

## 2023-02-18 VITALS — BP 119/79 | HR 96 | Temp 98.0°F | Ht 68.0 in | Wt 194.0 lb

## 2023-02-18 DIAGNOSIS — F4310 Post-traumatic stress disorder, unspecified: Secondary | ICD-10-CM | POA: Diagnosis not present

## 2023-02-18 DIAGNOSIS — J390 Retropharyngeal and parapharyngeal abscess: Secondary | ICD-10-CM

## 2023-02-18 DIAGNOSIS — Z930 Tracheostomy status: Secondary | ICD-10-CM

## 2023-02-18 DIAGNOSIS — E282 Polycystic ovarian syndrome: Secondary | ICD-10-CM

## 2023-02-18 MED ORDER — METFORMIN HCL 500 MG PO TABS: 500.0000 mg | ORAL_TABLET | Freq: Two times a day (BID) | ORAL | 1 refills | Status: DC

## 2023-02-18 NOTE — Assessment & Plan Note (Signed)
chronic, followed by [facility] taking Prazoxin qhs

## 2023-02-18 NOTE — Assessment & Plan Note (Signed)
Recent hospitalization with severe infection requiring I&D procedure & tracheostomy. Currently on Augmentin. Follow-up appointments scheduled with ENT surgeon, infectious disease specialist, and tracheostomy center. -Continue Augmentin as prescribed. -Encouraged to take ibuprofen for inflammation and pain, with food to prevent nausea. -Attend all follow-up appointments.

## 2023-02-18 NOTE — Assessment & Plan Note (Signed)
s/p pharyngeal abscess I & D surgery pt tolerating, reports discomfort & is self-conscious, anxious to have removed understands cleaning & care has f/u appt for possible removal in 2 w

## 2023-02-18 NOTE — Progress Notes (Signed)
Patient ID: Cheryl Tran, female    DOB: 09/04/1990, 32 y.o.   MRN: [id number]  Chief Complaint  Patient presents with   Transfer of care   Follow-up    Pt was seen in ED 02/08/2023 to 02/15/2023. Pt had abscess in neck and SOB.    PCOS    Medication refill of metformin   Discussed the use of AI scribe software for clinical note transcription with the patient, who gave verbal consent to proceed.  History of Present Illness   The patient, with a history of PCOS, recently experienced a severe retropharyngeal infection that necessitated a tracheostomy. The onset of the infection was sudden and severe, leading to difficulty breathing and an emergency hospital visit. She reports waking up with a trach in her throat and she panicked and left the hospital but was convinced to return. Pt also states she had been without her psych meds prior to the hospital visit, both were refilled at discharge. The patient believes the infection may have been triggered by a recent wisdom teeth extraction, for which she did not receive antibiotics or painkillers. The infection was severe enough to cause a significant increase in the patient's WBC, which has since decreased with treatment, and she was given Augmentin at discharge.The patient also mentions experiencing extreme discomfort and exhaustion, which she attributes to the infection. She also reports increased appetite since her hospital discharge. The patient also has a history of mental health issues and is currently seeing a psychiatrist.    Assessment & Plan:     Retropharyngeal abscess - Recent hospitalization (d/c 10/28) with severe infection requiring I&D procedure & tracheostomy. Currently on Augmentin. Follow-up appointments scheduled with ENT surgeon, infectious disease specialist, and tracheostomy center. -Continue Augmentin as prescribed. -Encouraged to take ibuprofen for inflammation and pain, with food to prevent nausea. -Attend all follow-up  appointments.  Post-Traumatic Stress Disorder (PTSD) & Cluster B personality disorder - Reports nightmares and high anxiety, particularly after recent hospitalization. Currently on oxcarbazepine and prazosin and states she is doing better. -Continue oxcarbazepine and prazosin as prescribed. -Encouraged to continue therapy.  Polycystic Ovary Syndrome (PCOS) - Currently on metformin 500mg  bid. -Refill metformin prescription.     Subjective:    Outpatient Medications Prior to Visit  Medication Sig Dispense Refill   albuterol (PROVENTIL) (2.5 MG/3ML) 0.083% nebulizer solution Take 3 mLs (2.5 mg total) by nebulization every 6 (six) hours as needed for wheezing or shortness of breath. 90 mL 5   albuterol (VENTOLIN HFA) 108 (90 Base) MCG/ACT inhaler Inhale 2 puffs into the lungs every 6 (six) hours as needed for wheezing or shortness of breath. 18 g 5   amoxicillin-clavulanate (AUGMENTIN) 875-125 MG tablet Take 1 tablet by mouth 2 (two) times daily for 12 days. Take for a total of 21 days (per patient report-she has 9 days worth of supply at home) 24 tablet 0   gabapentin (NEURONTIN) 300 MG capsule Take 1 capsule (300 mg total) by mouth 3 (three) times daily. 90 capsule 3   ibuprofen (ADVIL) 800 MG tablet Take 800 mg by mouth daily as needed for headache or moderate pain (pain score 4-6).     metFORMIN (GLUCOPHAGE) 500 MG tablet Take 1 tablet (500 mg total) by mouth 2 (two) times daily with a meal. 180 tablet 1   Oxcarbazepine (TRILEPTAL) 300 MG tablet Take 1 tablet (300 mg total) by mouth 2 (two) times daily. 60 tablet 1   prazosin (MINIPRESS) 1 MG capsule Take 1 capsule (  1 mg total) by mouth at bedtime. 30 capsule 0   SYMBICORT 80-4.5 MCG/ACT inhaler Inhale 2 puffs into the lungs 2 (two) times daily. (Patient taking differently: Inhale 2 puffs into the lungs 2 (two) times daily as needed (wheezing, shortness of breath).) 10.2 g 5   No facility-administered medications prior to visit.   Past  Medical History:  Diagnosis Date   Anxiety    Asthma    Bipolar 1 disorder (HCC)    Depression    Past Surgical History:  Procedure Laterality Date   INCISION AND DRAINAGE ABSCESS N/A 02/12/2023   Procedure: INCISION AND DRAINAGE PHARYNGEAL ABSCESS;  Surgeon: Read Drivers, MD;  Location: [facility];  Service: ENT;  Laterality: N/A;   KNEE ARTHROSCOPY WITH ANTERIOR CRUCIATE LIGAMENT (ACL) REPAIR Right 08/18/2022   Procedure: RIGHT KNEE ARTHROSCOPY WITH ANTERIOR CRUCIATE LIGAMENT (ACL) REPAIR;  Surgeon: Huel Cote, MD;  Location: [facility];  Service: Orthopedics;  Laterality: Right;   KNEE ARTHROSCOPY WITH MACI CARTILAGE HARVEST Right 06/22/2022   Procedure: RIGHT KNEE ARTHROSCOPY PATELLA CHONDROPLASTY  WITH MACI CARTILAGE HARVEST;  Surgeon: Huel Cote, MD;  Location: [facility];  Service: Orthopedics;  Laterality: Right;   OSTEOCHONDRAL DEFECT REPAIR/RECONSTRUCTION Right 08/18/2022   Procedure: OSTEOCHONDRAL DEFECT REPAIR/RECONSTRUCTION/MACI IMPLANTATION;  Surgeon: Huel Cote, MD;  Location: [facility];  Service: Orthopedics;  Laterality: Right;   TONSILLECTOMY AND ADENOIDECTOMY N/A 02/06/2023   Procedure: TRANSORAL DRAINAGE ABSCESS;  Surgeon: Ashok Croon, MD;  Location: [facility] OR;  Service: ENT;  Laterality: N/A;   TRACHEOSTOMY TUBE PLACEMENT N/A 02/06/2023   Procedure: FIBEROPTIC INTUBATION WITH TRACHEOSTOMY;  Surgeon: Ashok Croon, MD;  Location: [facility] OR;  Service: ENT;  Laterality: N/A;   No Known Allergies    Objective:    Physical Exam Vitals and nursing note reviewed.  Constitutional:      Appearance: Normal appearance.  Neck:     Trachea: Tracheostomy present.  Cardiovascular:     Rate and Rhythm: Normal rate and regular rhythm.  Pulmonary:     Effort: Pulmonary effort is normal.     Breath sounds: Normal breath sounds.  Musculoskeletal:        General: Normal range of motion.  Skin:    General: Skin is warm  and dry.  Neurological:     Mental Status: She is alert.  Psychiatric:        Mood and Affect: Mood normal.        Behavior: Behavior normal.    BP 119/79 (BP Location: Left Arm, Patient Position: Sitting, Cuff Size: Normal)   Pulse 96   Temp 98 F (36.7 C) (Temporal)   Ht 5\' 8"  (1.727 m)   Wt 194 lb (88 kg)   LMP 01/20/2023 (Exact Date)   SpO2 98%   BMI 29.50 kg/m  Wt Readings from Last 3 Encounters:  02/18/23 194 lb (88 kg)  02/08/23 205 lb 11 oz (93.3 kg)  02/07/23 205 lb 11 oz (93.3 kg)      Dulce Sellar, NP

## 2023-02-21 ENCOUNTER — Emergency Department (HOSPITAL_COMMUNITY): Payer: MEDICAID

## 2023-02-21 ENCOUNTER — Emergency Department (HOSPITAL_COMMUNITY)
Admission: EM | Admit: 2023-02-21 | Discharge: 2023-02-21 | Disposition: A | Discharge status: Home / Self Care | Payer: MEDICAID | Attending: Emergency Medicine | Admitting: Emergency Medicine

## 2023-02-21 DIAGNOSIS — R0602 Shortness of breath: Secondary | ICD-10-CM | POA: Insufficient documentation

## 2023-02-21 MED ORDER — IPRATROPIUM-ALBUTEROL 0.5-2.5 (3) MG/3ML IN SOLN
3.0000 mL | Freq: Once | RESPIRATORY_TRACT | Status: AC
  Administered 2023-02-21: 3 mL via RESPIRATORY_TRACT
  Filled 2023-02-21: qty 3

## 2023-02-21 NOTE — Progress Notes (Signed)
Pt comes in for trach dislodgment and unsure of placement after loosening her trach ties and trach coming out about 1 inch, coughing and had red streak sputum and got scared. I evaluated patient and she still has a slight irritating cough but no more bloody secretions. Positive color change on ETCO2 detector. EDP at the bedside at this time.

## 2023-02-21 NOTE — Discharge Instructions (Signed)
Follow up with your family doc.  °

## 2023-02-21 NOTE — ED Notes (Signed)
Patient verbalizes understanding of discharge instructions. Opportunity for questioning and answers were provided. Armband removed by staff, pt discharged from ED. Ambulated out to lobby  

## 2023-02-21 NOTE — ED Provider Notes (Signed)
[place] EMERGENCY DEPARTMENT AT [facility] Provider Note   CSN: [id number] Arrival date & time: 02/21/23  0136     History  No chief complaint on file.   Cheryl Tran is a 32 y.o. female.  32 yo F with a chief complaints of difficulty breathing.  That she was adjusting her trach and ended up becoming partially dislodged she started coughing had a coughing fit that lasted for about 45 minutes to an hour.  She now feels much better.  She is still a little bit short of breath but she thinks she is just catching up.  She had 1 little bit of pinkish sputum with her coughing that she was worried about as well.        Home Medications Prior to Admission medications   Medication Sig Start Date End Date Taking? Authorizing Provider  albuterol (PROVENTIL) (2.5 MG/3ML) 0.083% nebulizer solution Take 3 mLs (2.5 mg total) by nebulization every 6 (six) hours as needed for wheezing or shortness of breath. 04/29/22   Dulce Sellar, NP  albuterol (VENTOLIN HFA) 108 (90 Base) MCG/ACT inhaler Inhale 2 puffs into the lungs every 6 (six) hours as needed for wheezing or shortness of breath. 04/29/22   Dulce Sellar, NP  amoxicillin-clavulanate (AUGMENTIN) 875-125 MG tablet Take 1 tablet by mouth 2 (two) times daily for 12 days. Take for a total of 21 days (per patient report-she has 9 days worth of supply at home) 02/15/23 02/27/23  Ghimire, Werner Lean, MD  gabapentin (NEURONTIN) 300 MG capsule Take 1 capsule (300 mg total) by mouth 3 (three) times daily. 07/08/22   Shanna Cisco, NP  ibuprofen (ADVIL) 800 MG tablet Take 800 mg by mouth daily as needed for headache or moderate pain (pain score 4-6).    [provider]  metFORMIN (GLUCOPHAGE) 500 MG tablet Take 1 tablet (500 mg total) by mouth 2 (two) times daily with a meal. 02/18/23   Dulce Sellar, NP  Oxcarbazepine (TRILEPTAL) 300 MG tablet Take 1 tablet (300 mg total) by mouth 2 (two) times daily. 08/13/22      prazosin (MINIPRESS) 1 MG capsule Take 1 capsule (1 mg total) by mouth at bedtime. 02/15/23   Ghimire, Werner Lean, MD  SYMBICORT 80-4.5 MCG/ACT inhaler Inhale 2 puffs into the lungs 2 (two) times daily. Patient taking differently: Inhale 2 puffs into the lungs 2 (two) times daily as needed (wheezing, shortness of breath). 04/29/22   Dulce Sellar, NP      Allergies    Patient has no known allergies.    Review of Systems   Review of Systems  Physical Exam Updated Vital Signs BP 127/82   Pulse 73   Temp 98.7 F (37.1 C) (Oral)   Resp 18   LMP 01/20/2023 (Exact Date)   SpO2 100%  Physical Exam Vitals and nursing note reviewed.  Constitutional:      General: She is not in acute distress.    Appearance: She is well-developed. She is not diaphoretic.  HENT:     Head: Normocephalic and atraumatic.     Mouth/Throat:     Comments: Trach in place, no significant drainage Eyes:     Pupils: Pupils are equal, round, and reactive to light.  Cardiovascular:     Rate and Rhythm: Normal rate and regular rhythm.     Heart sounds: No murmur heard.    No friction rub. No gallop.  Pulmonary:     Effort: Pulmonary effort is normal.  Breath sounds: No wheezing or rales.  Abdominal:     General: There is no distension.     Palpations: Abdomen is soft.     Tenderness: There is no abdominal tenderness.  Musculoskeletal:        General: No tenderness.     Cervical back: Normal range of motion and neck supple.  Skin:    General: Skin is warm and dry.  Neurological:     Mental Status: She is alert and oriented to person, place, and time.  Psychiatric:        Behavior: Behavior normal.     ED Results / Procedures / Treatments   Labs (all labs ordered are listed, but only abnormal results are displayed) Labs Reviewed - No data to display  EKG None  Radiology DG Chest [facility] 1 View  Result Date: 02/21/2023 CLINICAL DATA:  Shortness of breath EXAM: PORTABLE CHEST 1 VIEW  COMPARISON:  02/12/2023 FINDINGS: Tracheostomy in satisfactory position. Lungs are clear No pleural effusion or pneumothorax. The heart is normal in size. Prior subcutaneous emphysema in the bilateral supraclavicular regions has resolved. IMPRESSION: Tracheostomy in satisfactory position. No acute cardiopulmonary disease. Electronically Signed   By: Charline Bills M.D.   On: 02/21/2023 03:37    Procedures Procedures    Medications Ordered in ED Medications  ipratropium-albuterol (DUONEB) 0.5-2.5 (3) MG/3ML nebulizer solution 3 mL (3 mLs Nebulization Given 02/21/23 0243)    ED Course/ Medical Decision Making/ A&P                                 Medical Decision Making Amount and/or Complexity of Data Reviewed Radiology: ordered.  Risk Prescription drug management.   32 yo F with a cc of sob.  Partially dislodged the trach.  Replaced on her own.  Coughing fit that lasted a while.  Now feeling a bit better.  Some wheezing on exam.  Give a neb.  CXR.  Reassess.   Cxr independently interpreted by me without focal trait or pneumothorax.  Patient reassessed and feeling quite a bit better.  Will discharge home.  PCP follow-up.  4:03 AM:  I have discussed the diagnosis/risks/treatment options with the patient.  Evaluation and diagnostic testing in the emergency department does not suggest an emergent condition requiring admission or immediate intervention beyond what has been performed at this time.  They will follow up with PCP. We also discussed returning to the ED immediately if new or worsening sx occur. We discussed the sx which are most concerning (e.g., sudden worsening pain, fever, inability to tolerate by mouth) that necessitate immediate return. Medications administered to the patient during their visit and any new prescriptions provided to the patient are listed below.  Medications given during this visit Medications  ipratropium-albuterol (DUONEB) 0.5-2.5 (3) MG/3ML nebulizer  solution 3 mL (3 mLs Nebulization Given 02/21/23 0243)     The patient appears reasonably screen and/or stabilized for discharge and I doubt any other medical condition or other [facility] requiring further screening, evaluation, or treatment in the ED at this time prior to discharge.          Final Clinical Impression(s) / ED Diagnoses Final diagnoses:  SOB (shortness of breath)    Rx / DC Orders ED Discharge Orders     None         Melene Plan, DO 02/21/23 0403

## 2023-02-25 ENCOUNTER — Ambulatory Visit (INDEPENDENT_AMBULATORY_CARE_PROVIDER_SITE_OTHER): Payer: MEDICAID | Admitting: Otolaryngology

## 2023-02-25 ENCOUNTER — Encounter (INDEPENDENT_AMBULATORY_CARE_PROVIDER_SITE_OTHER): Payer: Self-pay | Admitting: Otolaryngology

## 2023-02-25 VITALS — BP 133/91 | HR 81 | Ht 68.0 in | Wt 196.0 lb

## 2023-02-25 DIAGNOSIS — J36 Peritonsillar abscess: Secondary | ICD-10-CM

## 2023-02-25 DIAGNOSIS — J390 Retropharyngeal and parapharyngeal abscess: Secondary | ICD-10-CM

## 2023-02-25 DIAGNOSIS — Z930 Tracheostomy status: Secondary | ICD-10-CM

## 2023-02-25 NOTE — Progress Notes (Signed)
ENT CONSULT:  Reason for Consult: hospital f/u s/p trach and retropharyngeal/parapharyngeal abscess I&D x 2  HPI: Cheryl Tran is an 32 y.o. female with hx of asthma and depression patient who presented to the ER few days after undergoing right maxillary and mandibular molar extraction by an oral surgeon with facial and throat swelling trouble with secretion tolerance and dysphagia, with imaging notable for right parapharyngeal abscess and swelling along the pharyngeal wall on flexible laryngoscopy.  She was urgently taken to the operating room for tracheostomy and I&D of the parapharyngeal abscess and was admitted for observation but unfortunately left AMA postop day 1.  She was then encouraged to present to the ER again and was readmitted for IV antibiotics and observation, and was found to have persistent and worsening white count concerning for recollection of the abscess.  Repeat imaging demonstrated persistent collection and she was taken to the operating room with Dr. Allena Katz 02/12/2023 for repeat I&D of right pharyngeal abscess, and improved further following the procedure on IV antibiotics.  She was transitioned to p.o. Augmentin per ID recommendations and was discharged with tracheostomy 02/15/23 and speaking valve.  She reports that she is able to phonate and eating regular diet without issues since her discharge from the hospital.  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  Here for outpatient follow-up with me and possible decannulation.  Denies fevers chills, denies facial or oral swelling, denies shortness of breath or trouble with swallowing.  Records Reviewed:  Hospital course d/c summary 02/15/23 32 y.o.  female with history of depression-asthma-who was recently hospitalized from 10/19-10/21 on the PCCM service for right retropharyngeal abscess requiring I&D and tracheotomy-she unfortunately signed out AMA and came back to the hospital for shortness of breath-she was  then admitted to the hospitalist service.  See below for further details.   Significant events: 10/19-10/21>> hospitalization for retropharyngeal abscess-s/p tracheotomy/I&D-signed out AMA 10/21>> back to the ED-admit to [facility]   Significant studies: 10/19>> CT neck: Extensive soft tissue infection arising from one of the right sided molar extraction sites with right parapharyngeal/masticator space abscess.   Significant microbiology data: 10/19>> blood culture: No growth 10/19>> retropharyngeal abscess culture: Streptococcus group F, Eikenella   Procedures: 10/25>> I&D of parapharyngeal abscess by ENT   Consults: ENT Psychiatry PCCM   Brief Hospital Course: Upper airway obstruction due to retropharyngeal abscess-s/p I&D and tracheotomy on 10/19 and repeat I&D on 10/24 Clinically improved after repeat I&D on 10/24-leukocytosis continues to downtrend-drain was removed by ENT on 10/28 Per ENT-needs 3 weeks of Augmentin Will be discharged with tracheostomy-ENT will follow-up in the clinic in attempt decannulation. Tracheostomy education/care completed by RN-patient feels comfortable going home-in fact has been requesting discharge for the past several days.    Depression/PTSD Evaluated by psychiatry-an IVC'd-Per psychiatry-continue IVC until patient is medically stable for discharge-which she is on 10/28. Contacted Child psychotherapist to see if we can resend IVC Continue Tegretol/Neurontin/Minipress.   Follow-up with outpatient psychiatrist/therapist.   Bronchial asthma Stable Bronchodilators   Hypokalemia Repleted      Past Medical History:  Diagnosis Date   Airway obstruction 02/08/2023   Anxiety    Asthma    Bipolar 1 disorder (HCC)    Critical airway 02/06/2023   Depression    Pharyngeal abscess 02/12/2023   Retropharyngeal abscess 02/06/2023    Past Surgical History:  Procedure Laterality Date   INCISION AND DRAINAGE ABSCESS N/A 02/12/2023   Procedure: INCISION AND  DRAINAGE  PHARYNGEAL ABSCESS;  Surgeon: Read Drivers, MD;  Location: [facility] OR;  Service: ENT;  Laterality: N/A;   KNEE ARTHROSCOPY WITH ANTERIOR CRUCIATE LIGAMENT (ACL) REPAIR Right 08/18/2022   Procedure: RIGHT KNEE ARTHROSCOPY WITH ANTERIOR CRUCIATE LIGAMENT (ACL) REPAIR;  Surgeon: Huel Cote, MD;  Location: [facility];  Service: Orthopedics;  Laterality: Right;   KNEE ARTHROSCOPY WITH MACI CARTILAGE HARVEST Right 06/22/2022   Procedure: RIGHT KNEE ARTHROSCOPY PATELLA CHONDROPLASTY  WITH MACI CARTILAGE HARVEST;  Surgeon: Huel Cote, MD;  Location: [facility];  Service: Orthopedics;  Laterality: Right;   OSTEOCHONDRAL DEFECT REPAIR/RECONSTRUCTION Right 08/18/2022   Procedure: OSTEOCHONDRAL DEFECT REPAIR/RECONSTRUCTION/MACI IMPLANTATION;  Surgeon: Huel Cote, MD;  Location: [facility];  Service: Orthopedics;  Laterality: Right;   TONSILLECTOMY AND ADENOIDECTOMY N/A 02/06/2023   Procedure: TRANSORAL DRAINAGE ABSCESS;  Surgeon: Ashok Croon, MD;  Location: [facility] OR;  Service: ENT;  Laterality: N/A;   TRACHEOSTOMY TUBE PLACEMENT N/A 02/06/2023   Procedure: FIBEROPTIC INTUBATION WITH TRACHEOSTOMY;  Surgeon: Ashok Croon, MD;  Location: [facility] OR;  Service: ENT;  Laterality: N/A;    Family History  Problem Relation Age of Onset   Neuropathy Mother    Addison's disease Mother    Hypertension Mother    Pernicious anemia Mother    Hypertension Father    Diabetes Father    Graves' disease Sister    Bipolar disorder Sister    Breast cancer Maternal Grandmother    Hyperthyroidism Maternal Grandmother     Social History:  reports that she has been smoking cigarettes and cigars. She has never used smokeless tobacco. She reports current alcohol use. She reports current drug use. Drug: Marijuana.  Allergies: No Known Allergies  Medications: I have reviewed the patient's current medications.  The PMH, PSH, Medications, Allergies, and SH were  reviewed and updated.  ROS: Constitutional: Negative for fever, weight loss and weight gain. Cardiovascular: Negative for chest pain and dyspnea on exertion. Respiratory: Is not experiencing shortness of breath at rest. Gastrointestinal: Negative for nausea and vomiting. Neurological: Negative for headaches. Psychiatric: The patient is not nervous/anxious  Blood pressure (!) 133/91, pulse 81, height 5\' 8"  (1.727 m), weight 196 lb (88.9 kg), last menstrual period 01/20/2023, SpO2 97%.  PHYSICAL EXAM:  Exam: General: Well-developed, well-nourished Communication and Voice: Able to phonate well with speaking valve in place Respiratory Respiratory effort: Equal inspiration and expiration without stridor Cardiovascular Peripheral Vascular: Warm extremities with equal color/perfusion Eyes: No nystagmus with equal extraocular motion bilaterally Neuro/Psych/Balance: Patient oriented to person, place, and time; Appropriate mood and affect; Gait is intact with no imbalance; Cranial nerves I-XII are intact Head and Face Inspection: Normocephalic and atraumatic without mass or lesion Palpation: Facial skeleton intact without bony stepoffs Salivary Glands: No mass or tenderness Facial Strength: Facial motility symmetric and full bilaterally ENT Pinna: External ear intact and fully developed External canal: Canal is patent with intact skin Tympanic Membrane: Clear and mobile External Nose: No scar or anatomic deformity Internal Nose: Septum is deviated to the left. No polyp, or purulence. Mucosal edema and erythema present.  Bilateral inferior turbinate hypertrophy.  Lips, Teeth, and gums: Mucosa and teeth intact and viable TMJ: No pain to palpation with full mobility Oral cavity/oropharynx: No erythema or exudate, no lesions present Nasopharynx: No mass or lesion with intact mucosa, no evidence of previously seen right pharyngeal wall edema and swelling with complete resolution of right  pharyngeal wall bulging, Hypopharynx: Intact mucosa without pooling of secretions Larynx Glottic: Full true  vocal cord mobility without lesion or mass Supraglottic: Normal appearing epiglottis and AE folds Interarytenoid Space: No or minimal pachydermia or edema Subglottic Space: Patent without lesion or edema and no granulation tissue or scar in the area of tracheal stoma (retroflex view performed during tracheoscopy) Neck Neck and Trachea: Midline trachea without mass or lesion Thyroid: No mass or nodularity Lymphatics: No lymphadenopathy  Procedure: Preoperative diagnosis: s/p tracheostomy and I&D of right parapharyngeal abscess/with airway obstruction  Postoperative diagnosis:   Same  Procedure: Flexible fiberoptic laryngoscopy  Surgeon: Ashok Croon, MD  Anesthesia: Topical lidocaine and Afrin Complications: None Condition is stable throughout exam  Indications and consent:  The patient presents to the clinic with Indirect laryngoscopy view was incomplete. Thus it was recommended that they undergo a flexible fiberoptic laryngoscopy. All of the risks, benefits, and potential complications were reviewed with the patient preoperatively and verbal informed consent was obtained.  Procedure: The patient was seated upright in the clinic. Topical lidocaine and Afrin were applied to the nasal cavity. After adequate anesthesia had occurred, I then proceeded to pass the flexible telescope into the nasal cavity. The nasal cavity was patent without rhinorrhea or polyp. The nasopharynx was also patent without mass or lesion. The base of tongue was visualized and was normal. There were no signs of pooling of secretions in the piriform sinuses. The true vocal folds were mobile bilaterally. There were no signs of glottic or supraglottic mucosal lesion or mass. There was mild interarytenoid pachydermia and post cricoid edema. The telescope was then slowly withdrawn and the patient tolerated the  procedure throughout.  Procedure note: Tracheoscopy decannulation [phone]  The patient was seated in the clinic and correctly identified.  Consent was obtained and a time-out was performed.  The tracheostomy tube was removed under direct visualization and I confirmed that there were no excessive secretions or granulation tissue.  This concluded the procedure.  The patient tolerated the procedure well and without immediate complication.  Old trach tube: 6 oh cuffless Shiley, retroflexed view without evidence of subglottic scar granulation tissue around the stoma, patient decannulated after the procedure  Studies Reviewed: CT soft tissue neck 02/12/2023 IMPRESSION: 1. The right masticator space/peritonsillar abscess is decreased in size but not resolved since the study from 02/06/2023 following incision and drainage, with persistent surrounding mucosal edema and near-complete effacement of the oropharyngeal airway. 2. Decreased reactive cervical lymphadenopathy. 3. Extensive soft tissue gas throughout the neck extending into the upper mediastinum and anterior chest wall. This may be iatrogenic following incision and drainage or related to positive pressure ventilation. 4. New patchy opacities in both imaged lungs, likely infectious/inflammatory.  Assessment/Plan: Encounter Diagnoses  Name Primary?   Parapharyngeal abscess Yes   Peritonsillar abscess    Tracheostomy in place [facility])     Right masticator space parapharyngeal peritonsillar infection with an abscess requiring incision and drainage x 2 and tracheostomy due to upper airway narrowing, with hospital admission for IV antibiotics and monitoring, doing very well, on p.o. Augmentin per ID and still has 2 weeks left -Flexible laryngoscopy today with evidence of resolved bulging of the right parapharyngeal space and complete resolution of peritonsillar edema right pharyngeal wall edema and swelling -Tracheoscopy with patent airway and  no evidence of subglottic scar -Patient was decannulated today with occlusive dressing applied over the stoma -instructed to keep pressure on the stoma with formation to optimize healing and closure -We discussed that most stomal openings will close and heal without interventions, and we will see the patient  for follow-up in 2-3 months -We discussed that 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 Please do not hesitate to contact me with any questions or concerns.   Ashok Croon, MD Otolaryngology [facility] Phone: [phone] Fax: [phone]    02/25/2023, 3:46 PM

## 2023-03-04 ENCOUNTER — Telehealth: Payer: Self-pay

## 2023-03-04 ENCOUNTER — Inpatient Hospital Stay: Payer: MEDICAID | Admitting: Internal Medicine

## 2023-03-04 NOTE — Progress Notes (Deleted)
Patient: Cheryl Tran  DOB: [birth year] MRN: [id number] PCP: Dulce Sellar, NP  Referring Provider: ***  No chief complaint on file.    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  Patient was initially admitted on 10/19 few days flock following for symptoms did not extraction led by difficulty swallowing.  Found to have parapharyngeal, masticator space abscess, retropharyngeal abscess requiring tracheostomy and transoral I&D on 10/19.  Per OR note there was purulent drainage noted in parapharyngeal wall patient left AMA.  She came back and Unasyn was restarted the next day.  Taken to the OR on 10/25 for repeat I&D  transoral parapharyngeal masticator space abscess, trach change.  Per OR note, on gingival incision purulent fluid was encountered and cultured.  Penrose drain removed..  OR cultures from 10/19 grew group F strep.  Patient transition to Augmentin on discharge to complete 3 weeks antibiotics from the OR on 10/25 EOT 11/14.  On 11/7 she was seen by ENT and underwent to flex larynx endoscopy with evidence of resolved bulging of the right parapharyngeal space and complete resolution of peritonsillar  edema right parapharyngeal wall edema and swelling.,  No evidence subglottic scar.  ROS  Past Medical History:  Diagnosis Date   Airway obstruction 02/08/2023   Anxiety    Asthma    Bipolar 1 disorder (HCC)    Critical airway 02/06/2023   Depression    Pharyngeal abscess 02/12/2023   Retropharyngeal abscess 02/06/2023    Outpatient Medications Prior to Visit  Medication Sig Dispense Refill   albuterol (PROVENTIL) (2.5 MG/3ML) 0.083% nebulizer solution Take 3 mLs (2.5 mg total) by nebulization every 6 (six) hours as needed for wheezing or shortness of breath. 90 mL 5   albuterol (VENTOLIN HFA) 108 (90 Base) MCG/ACT inhaler Inhale 2 puffs into the lungs every 6 (six) hours as needed for wheezing or shortness of breath. 18 g 5   gabapentin (NEURONTIN) 300 MG capsule Take 1 capsule (300 mg total) by mouth 3 (three) times daily. 90 capsule 3   ibuprofen (ADVIL) 800 MG tablet Take 800 mg by mouth daily as needed for headache or moderate pain (pain score 4-6).     metFORMIN (GLUCOPHAGE) 500 MG tablet Take 1 tablet (500 mg total) by mouth 2 (two) times daily with a meal. 180 tablet 1   Oxcarbazepine (TRILEPTAL) 300 MG tablet Take 1 tablet (300 mg total) by mouth 2 (two) times daily. 60 tablet 1   prazosin (MINIPRESS) 1 MG capsule Take 1 capsule (1 mg total) by mouth at bedtime. 30 capsule 0   SYMBICORT 80-4.5 MCG/ACT inhaler Inhale 2 puffs into the lungs 2 (two) times daily. (Patient taking differently: Inhale 2 puffs into the lungs 2 (two) times daily  as needed (wheezing, shortness of breath).) 10.2 g 5   No facility-administered medications prior to visit.     No Known Allergies  Social History   Tobacco Use   Smoking status: Every Day    Types: Cigarettes, Cigars   Smokeless tobacco: Never  Vaping Use   Vaping status: Never Used  Substance Use Topics   Alcohol use: Yes    Comment: socially   Drug use: Yes    Types: Marijuana    Comment: 02/05/23    Family History  Problem  Relation Age of Onset   Neuropathy Mother    Addison's disease Mother    Hypertension Mother    Pernicious anemia Mother    Hypertension Father    Diabetes Father    Graves' disease Sister    Bipolar disorder Sister    Breast cancer Maternal Grandmother    Hyperthyroidism Maternal Grandmother     Objective:  There were no vitals filed for this visit. There is no height or weight on file to calculate BMI.  Physical Exam  Lab Results: Lab Results  Component Value Date   WBC 12.9 (H) 02/15/2023   HGB 13.2 02/15/2023   HCT 40.7 02/15/2023   MCV 84.4 02/15/2023   PLT 452 (H) 02/15/2023    Lab Results  Component Value Date   CREATININE 0.82 02/11/2023   BUN 10 02/11/2023   NA 139 02/11/2023   K 3.5 02/11/2023   CL 106 02/11/2023   CO2 23 02/11/2023    Lab Results  Component Value Date   ALT 12 02/08/2023   AST 27 02/08/2023   ALKPHOS 52 02/08/2023   BILITOT 1.7 (H) 02/08/2023     Assessment & Plan:  #Parapharyngeal masticator space abscess status post tracheostomy and transoral incision and drainage with ENT on 10/19   Danelle Earthly, MD [facility]   03/04/23  8:37 AM

## 2023-03-04 NOTE — Telephone Encounter (Signed)
Called patient to see if she was going to make it to today's appointment, no answer. Left HIPAA compliant voicemail requesting callback to reschedule. Okay to reschedule for next available appointment slot which is 11/27 per Dr. Thedore Mins.   Sandie Ano, RN

## 2023-03-08 LAB — FUNGUS CULTURE WITH STAIN

## 2023-03-08 LAB — FUNGUS CULTURE RESULT

## 2023-03-08 LAB — FUNGAL ORGANISM REFLEX

## 2023-03-25 ENCOUNTER — Telehealth (HOSPITAL_COMMUNITY): Payer: Self-pay

## 2023-03-25 NOTE — Telephone Encounter (Signed)
This therapist calls Josph Macho, referral coordinator with [facility], [phone] ext [phone] to speak with her regarding the type of referral she is making.  Therapist reaches voice mail and leaves a HIPAA compliant Voice Mail requesting a return call.  Remigio Eisenmenger, MS, LMFT, LCAS 03/25/23

## 2023-04-09 ENCOUNTER — Inpatient Hospital Stay: Payer: MEDICAID | Admitting: Internal Medicine

## 2023-04-19 ENCOUNTER — Telehealth (HOSPITAL_COMMUNITY): Payer: Self-pay | Admitting: Licensed Clinical Social Worker

## 2023-04-23 ENCOUNTER — Telehealth (HOSPITAL_COMMUNITY): Payer: Self-pay | Admitting: Professional

## 2023-04-26 ENCOUNTER — Telehealth (HOSPITAL_COMMUNITY): Payer: Self-pay | Admitting: Licensed Clinical Social Worker

## 2023-04-26 ENCOUNTER — Encounter: Payer: Self-pay | Admitting: Family

## 2023-04-26 ENCOUNTER — Ambulatory Visit (INDEPENDENT_AMBULATORY_CARE_PROVIDER_SITE_OTHER): Payer: MEDICAID | Admitting: Family

## 2023-04-26 VITALS — BP 128/85 | HR 71 | Temp 98.0°F | Ht 68.0 in | Wt 197.6 lb

## 2023-04-26 DIAGNOSIS — F4310 Post-traumatic stress disorder, unspecified: Secondary | ICD-10-CM | POA: Diagnosis not present

## 2023-04-26 DIAGNOSIS — R221 Localized swelling, mass and lump, neck: Secondary | ICD-10-CM

## 2023-04-26 DIAGNOSIS — F419 Anxiety disorder, unspecified: Secondary | ICD-10-CM

## 2023-04-26 DIAGNOSIS — F17200 Nicotine dependence, unspecified, uncomplicated: Secondary | ICD-10-CM | POA: Diagnosis not present

## 2023-04-26 DIAGNOSIS — F32A Depression, unspecified: Secondary | ICD-10-CM | POA: Diagnosis not present

## 2023-04-26 NOTE — Assessment & Plan Note (Signed)
 Chronic, Unstable;  Patient reports ongoing struggles with mental health, currently under the care of a counselor and medication provider. -Continue current mental health treatment plan. -Encourage patient to maintain regular contact with mental health providers.

## 2023-04-26 NOTE — Telephone Encounter (Signed)
 The therapist calls Cheryl Tran confirming her identity via two identifiers. She was referred to [facility] IOP by [facility] and questions why she has received calls about substance-related services denying that she has a substance abuse problem. She admits that she has been smoking weed since age 33 but does not see it as a problem. She told [facility] that being seen twice a week was not enough so wanted more intensive services.  After talking to her further, it becomes apparent that she is trying to obtain more information concerning PHP. Thus, the therapist sends a message to the [facility] asking that one return her call today if possible to provide her with an overview of the program and answer her questions.  Zell Maier, MA, LCSW, [facility], LCAS 04/26/2023

## 2023-04-26 NOTE — Progress Notes (Signed)
 Patient ID: Cheryl Tran, female    DOB: [birth year], 33 y.o.   MRN: [id number]  Chief Complaint  Patient presents with   Neck Pain    Pt c/o neck pain,swelling and discomfort. Present for 1 month. Has tried ice which did not help.        Discussed the use of AI scribe software for clinical note transcription with the patient, who gave verbal consent to proceed.  History of Present Illness   The patient, with a history of asthma and recent tracheostomy, presents with neck swelling & throat discomfort following tracheostomy removal in mid-November. She reports a sensation of 'something sitting' in her throat and frequent throat clearing. She has been in contact with the tracheostomy center and was reassured that these symptoms are likely not due to scar tissue, and should resolve over time. She states those sx have improved, but she now is concerned about persistent swelling under her chin and in her neck, which has not improved with ice, ibuprofen , or tea. She has no difficulty swallowing or breathing, and the swelling is not tender to touch. She has a family history of thyroid  problems and is concerned that this could be related.  The patient also reports struggling with depression and anxiety, for which she is currently receiving intensive care and medication management. She expresses concern about her overall health, given a family history of early mortality from various causes.     Assessment & Plan:     Neck swelling -  New - Swelling noted in lower neck area, bilaterally, unable to palpate thyroid  nodules.  Pt denies any SOB, throat pain or difficulty swallowing. -Ordering neck ultrasound to evaluate for possible causes of swelling, r/o thyroid  etiology. -Consider referral to Ear, Nose, and Throat specialist if ultrasound is negative.  Depression and Anxiety - Chronic, Unstable; Screening tool scores high; Patient reports ongoing struggles with mental health, currently under the care  of a counselor and medication provider. -Continue current mental health treatment plan. -Encourage patient to maintain regular contact with mental health providers.   Smoking - Patient continues to smoke the [employer] thin cigars, about 1-2 daily. -Encourage smoking cessation for overall health improvement.  -Advised pt to schedule 1 month f/u to discuss further. Would like her to get her psych meds and current sx under better control first.     Subjective:    Outpatient Medications Prior to Visit  Medication Sig Dispense Refill   albuterol  (PROVENTIL ) (2.5 MG/3ML) 0.083% nebulizer solution Take 3 mLs (2.5 mg total) by nebulization every 6 (six) hours as needed for wheezing or shortness of breath. 90 mL 5   albuterol  (VENTOLIN  HFA) 108 (90 Base) MCG/ACT inhaler Inhale 2 puffs into the lungs every 6 (six) hours as needed for wheezing or shortness of breath. 18 g 5   gabapentin  (NEURONTIN ) 300 MG capsule Take 1 capsule (300 mg total) by mouth 3 (three) times daily. 90 capsule 3   ibuprofen  (ADVIL ) 800 MG tablet Take 800 mg by mouth daily as needed for headache or moderate pain (pain score 4-6).     metFORMIN  (GLUCOPHAGE ) 500 MG tablet Take 1 tablet (500 mg total) by mouth 2 (two) times daily with a meal. 180 tablet 1   Oxcarbazepine  (TRILEPTAL ) 300 MG tablet Take 1 tablet (300 mg total) by mouth 2 (two) times daily. 60 tablet 1   prazosin  (MINIPRESS ) 1 MG capsule Take 1 capsule (1 mg total) by mouth at bedtime. 30 capsule 0   SYMBICORT   80-4.5 MCG/ACT inhaler Inhale 2 puffs into the lungs 2 (two) times daily. (Patient taking differently: Inhale 2 puffs into the lungs 2 (two) times daily as needed (wheezing, shortness of breath).) 10.2 g 5   No facility-administered medications prior to visit.   Past Medical History:  Diagnosis Date   Airway obstruction 02/08/2023   Anxiety    Asthma    Bipolar 1 disorder (HCC)    Critical airway 02/06/2023   Depression    Pharyngeal abscess 02/12/2023    Retropharyngeal abscess 02/06/2023   Past Surgical History:  Procedure Laterality Date   INCISION AND DRAINAGE ABSCESS N/A 02/12/2023   Procedure: INCISION AND DRAINAGE PHARYNGEAL ABSCESS;  Surgeon: Tobie Eldora NOVAK, MD;  Location: [facility] OR;  Service: ENT;  Laterality: N/A;   KNEE ARTHROSCOPY WITH ANTERIOR CRUCIATE LIGAMENT (ACL) REPAIR Right 08/18/2022   Procedure: RIGHT KNEE ARTHROSCOPY WITH ANTERIOR CRUCIATE LIGAMENT (ACL) REPAIR;  Surgeon: Genelle Standing, MD;  Location: [facility];  Service: Orthopedics;  Laterality: Right;   KNEE ARTHROSCOPY WITH MACI  CARTILAGE HARVEST Right 06/22/2022   Procedure: RIGHT KNEE ARTHROSCOPY PATELLA CHONDROPLASTY  WITH MACI  CARTILAGE HARVEST;  Surgeon: Genelle Standing, MD;  Location: [facility];  Service: Orthopedics;  Laterality: Right;   OSTEOCHONDRAL DEFECT REPAIR/RECONSTRUCTION Right 08/18/2022   Procedure: OSTEOCHONDRAL DEFECT REPAIR/RECONSTRUCTION/MACI  IMPLANTATION;  Surgeon: Genelle Standing, MD;  Location: [facility];  Service: Orthopedics;  Laterality: Right;   TONSILLECTOMY AND ADENOIDECTOMY N/A 02/06/2023   Procedure: TRANSORAL DRAINAGE ABSCESS;  Surgeon: Okey Burns, MD;  Location: [facility] OR;  Service: ENT;  Laterality: N/A;   TRACHEOSTOMY TUBE PLACEMENT N/A 02/06/2023   Procedure: FIBEROPTIC INTUBATION WITH TRACHEOSTOMY;  Surgeon: Okey Burns, MD;  Location: [facility] OR;  Service: ENT;  Laterality: N/A;   No Known Allergies    Objective:    Physical Exam Vitals and nursing note reviewed.  Constitutional:      Appearance: Normal appearance.  Neck:     Thyroid : No thyroid  tenderness.  Cardiovascular:     Rate and Rhythm: Normal rate and regular rhythm.  Pulmonary:     Effort: Pulmonary effort is normal.     Breath sounds: Normal breath sounds.  Musculoskeletal:        General: Normal range of motion.     Cervical back: Edema (lower neck, either side of trachea) and erythema (mild, either side of  trachea, lower neck) present.  Lymphadenopathy:     Cervical: No cervical adenopathy.  Skin:    General: Skin is warm and dry.  Neurological:     Mental Status: She is alert.  Psychiatric:        Mood and Affect: Mood normal.        Behavior: Behavior normal.    BP 128/85   Pulse 71   Temp 98 F (36.7 C) (Temporal)   Ht 5' 8 (1.727 m)   Wt 197 lb 9.6 oz (89.6 kg)   SpO2 99%   BMI 30.04 kg/m  Wt Readings from Last 3 Encounters:  04/26/23 197 lb 9.6 oz (89.6 kg)  02/25/23 196 lb (88.9 kg)  02/18/23 194 lb (88 kg)      Lucius Krabbe, NP

## 2023-04-27 ENCOUNTER — Telehealth (HOSPITAL_COMMUNITY): Payer: Self-pay | Admitting: Licensed Clinical Social Worker

## 2023-04-27 NOTE — Telephone Encounter (Signed)
 Cln returned call and oriented pt to PHP. Pt was noted to be irritable throughout. Cln asked if pt would like to be scheduled for CCA and pt stated, Well you have to give me time to process. You just gave me all of this information and you're dealing with people with mental health issues. You're dealing with the public and you need to give me a minute and give me a chance because I might have my own inquiries. Cln indicated that pt could take time to process and ask questions. Cln answered questions and pt stated she would need individual therapy in addition to group therapy and cln explained dual treatment. Pt responded, I understand that. I'm 32 years old. I understand policies and that I might have to pay out of pocket. What I'm saying is I want to get all of the services I need in one place, so I could see a therapist for individual therapy on Saturdays. Can you all help with that? Cln explained that [facility] does not schedule therapy on weekends. Pt expressed frustration with lack of availability for weekly therapy and declined PHP. She reports every other week, which she is currently getting at [facility], is not sufficient. She states she is also connected to [facility]. Cln confirmed that pt is seeking therapy once per week and offered to send pt in-network options, to which pt was agreeable. Pt confirmed she has [employer] provided email address: [email or web address]. Cln read email back and pt stated it is correct. Cln emailed pt contact info for [facility], [employer], [employer], and [employer]. Cln also advised pt she can search [employer] [facility] for self-pay therapists if that will work better for her.

## 2023-05-04 NOTE — Addendum Note (Signed)
 Addended byDulce Sellar on: 05/04/2023 03:21 PM   Modules accepted: Orders

## 2023-05-07 ENCOUNTER — Ambulatory Visit
Admission: RE | Admit: 2023-05-07 | Discharge: 2023-05-07 | Disposition: A | Discharge status: Home / Self Care | Payer: MEDICAID | Source: Ambulatory Visit | Attending: Family | Admitting: Family

## 2023-05-07 DIAGNOSIS — R221 Localized swelling, mass and lump, neck: Secondary | ICD-10-CM

## 2023-05-11 ENCOUNTER — Encounter: Payer: Self-pay | Admitting: Family

## 2023-05-19 ENCOUNTER — Ambulatory Visit (INDEPENDENT_AMBULATORY_CARE_PROVIDER_SITE_OTHER): Payer: MEDICAID | Admitting: Obstetrics and Gynecology

## 2023-05-19 ENCOUNTER — Encounter: Payer: Self-pay | Admitting: Obstetrics and Gynecology

## 2023-05-19 ENCOUNTER — Other Ambulatory Visit (HOSPITAL_COMMUNITY)
Admission: RE | Admit: 2023-05-19 | Discharge: 2023-05-19 | Disposition: A | Discharge status: Home / Self Care | Payer: MEDICAID | Source: Ambulatory Visit | Attending: Obstetrics and Gynecology | Admitting: Obstetrics and Gynecology

## 2023-05-19 VITALS — BP 132/85 | HR 75 | Ht 68.0 in | Wt 193.0 lb

## 2023-05-19 DIAGNOSIS — Z113 Encounter for screening for infections with a predominantly sexual mode of transmission: Secondary | ICD-10-CM | POA: Diagnosis not present

## 2023-05-19 DIAGNOSIS — Z124 Encounter for screening for malignant neoplasm of cervix: Secondary | ICD-10-CM

## 2023-05-19 DIAGNOSIS — Z1151 Encounter for screening for human papillomavirus (HPV): Secondary | ICD-10-CM | POA: Diagnosis not present

## 2023-05-19 DIAGNOSIS — Z01419 Encounter for gynecological examination (general) (routine) without abnormal findings: Secondary | ICD-10-CM | POA: Diagnosis present

## 2023-05-19 NOTE — Progress Notes (Signed)
ANNUAL EXAM Patient name: Cheryl Tran MRN [id number]  Date of birth: Dec 29, 1990 Chief Complaint:   Annual Exam  History of Present Illness:   Cheryl Tran is a 33 y.o. G3P0030 with Patient's last menstrual period was 04/24/2023. being seen today for a routine annual exam.  Current complaints: None   Upstream - 05/19/23 1044       Pregnancy Intention Screening   Does the patient want to become pregnant in the next year? Yes    Does the patient's partner want to become pregnant in the next year? N/A    Would the patient like to discuss contraceptive options today? No      Contraception Wrap Up   Current Method No Contraceptive Precautions    End Method No Contraception Precautions    Contraception Counseling Provided No    How was the end contraceptive method provided? N/A            The pregnancy intention screening data noted above was reviewed. Potential methods of contraception were discussed. The patient elected to proceed with No Contraception Precautions.   Last pap unsure. Results were: negative per pt report at [facility] . H/O abnormal pap: no Last mammogram: n/a. Results were: N/A. Family h/o breast cancer: yes maternal grandmother Last colonoscopy: n/a. Results were: N/A. Family h/o colorectal cancer: no HPV vaccine: no     04/26/2023    3:21 PM 07/08/2022    8:39 AM 05/12/2022    8:17 AM 04/29/2022    9:30 AM 04/07/2022    8:41 AM  Depression screen PHQ 2/9  Decreased Interest 3   3   Down, Depressed, Hopeless 3   3   PHQ - 2 Score 6   6   Altered sleeping 3   3   Tired, decreased energy 3   3   Change in appetite 3   3   Feeling bad or failure about yourself  3   3   Trouble concentrating 3   3   Moving slowly or fidgety/restless 3   3   Suicidal thoughts 3   3   PHQ-9 Score 27   27   Difficult doing work/chores Extremely dIfficult   Extremely dIfficult      Information is confidential and restricted. Go to Review Flowsheets to unlock data.         04/26/2023    3:21 PM 07/08/2022    8:41 AM 05/12/2022    8:16 AM 04/07/2022    8:45 AM  GAD 7 : Generalized Anxiety Score  Nervous, Anxious, on Edge 3     Control/stop worrying 3     Worry too much - different things 3     Trouble relaxing 3     Restless 2     Easily annoyed or irritable 3     Afraid - awful might happen 3     Total GAD 7 Score 20     Anxiety Difficulty Extremely difficult        Information is confidential and restricted. Go to Review Flowsheets to unlock data.     Review of Systems:   Pertinent items are noted in HPI Denies any headaches, blurred vision, fatigue, shortness of breath, chest pain, abdominal pain, abnormal vaginal discharge/itching/odor/irritation, problems with periods, bowel movements, urination, or intercourse unless otherwise stated above. Pertinent History Reviewed:  Reviewed past medical,surgical, social and family history.  Reviewed problem list, medications and allergies. Physical Assessment:   Vitals:   05/19/23  1005  BP: 132/85  Pulse: 75  Weight: 193 lb (87.5 kg)  Height: 5\' 8"  (1.727 m)  Body mass index is 29.35 kg/m.        Physical Examination:   General appearance - well appearing, and in no distress  Mental status - alert, oriented to person, place, and time  Chest - respiratory effort normal  Heart - normal peripheral perfusion  Breasts - breasts appear normal, no suspicious masses, no skin or nipple changes or axillary nodes  Abdomen - soft, nontender, nondistended, no masses or organomegaly  Pelvic - VULVA: normal appearing vulva with no masses, tenderness or lesions  VAGINA: normal appearing vagina with normal color and discharge, no lesions  CERVIX: normal appearing cervix without discharge or lesions, no CMT  Thin prep pap is done with HR HPV cotesting  Chaperone present for exam  No results found for this or any previous visit (from the past 24 hours).  Assessment & Plan:  1) Well-Woman Exam Mammogram: @  34yo, or sooner if problems Colonoscopy: @ 34yo, or sooner if problems Pap: Collected Gardasil: Declined GC/CT: Collected HIV/HCV: Collected  2) Preconception counseling Plans to schedule virtual visit for fertility discussion. Has hx PCOS, wasn't ovulating but having regular cycles. Was f/w [facility] in the past Taking PNV  Labs/procedures today:   Orders Placed This Encounter  Procedures   HIV antibody (with reflex)   Hepatitis C Antibody   Hepatitis B Surface AntiGEN   RPR   Meds: No orders of the defined types were placed in this encounter.  Follow-up: Return in about 1 year (around 05/18/2024) for annual exam or sooner as needed.  Lennart Pall, MD 05/19/2023 10:46 AM

## 2023-05-21 ENCOUNTER — Other Ambulatory Visit: Payer: Self-pay

## 2023-05-21 ENCOUNTER — Encounter: Payer: Self-pay | Admitting: Obstetrics and Gynecology

## 2023-05-21 LAB — CYTOLOGY - PAP
Chlamydia: NEGATIVE
Comment: NEGATIVE
Comment: NEGATIVE
Comment: NEGATIVE
Comment: NORMAL
Diagnosis: NEGATIVE
High risk HPV: NEGATIVE
Neisseria Gonorrhea: NEGATIVE
Trichomonas: POSITIVE — AB

## 2023-05-21 LAB — HEPATITIS C ANTIBODY: Hep C Virus Ab: NONREACTIVE

## 2023-05-21 LAB — HIV ANTIBODY (ROUTINE TESTING W REFLEX): HIV Screen 4th Generation wRfx: NONREACTIVE

## 2023-05-21 LAB — RPR: RPR Ser Ql: NONREACTIVE

## 2023-05-21 LAB — HEPATITIS B SURFACE ANTIGEN: Hepatitis B Surface Ag: NEGATIVE

## 2023-05-21 MED ORDER — METRONIDAZOLE 500 MG PO TABS
500.0000 mg | ORAL_TABLET | Freq: Two times a day (BID) | ORAL | 0 refills | Status: AC
  Filled 2023-05-21: qty 14, 7d supply, fill #0

## 2023-05-21 NOTE — Addendum Note (Signed)
Addended by: Harvie Bridge on: 05/21/2023 04:09 PM   Modules accepted: Orders

## 2023-05-24 ENCOUNTER — Telehealth: Payer: Self-pay | Admitting: *Deleted

## 2023-05-24 NOTE — Telephone Encounter (Signed)
-----   Message from Lennart Pall sent at 05/21/2023  4:09 PM EST ----- + for trich. Notified in Mychart comment and rx sent. Routing as FYI/for coordination of repeat testing if noted below. Please let me know if you need anything else

## 2023-05-24 NOTE — Telephone Encounter (Signed)
Left patient a message to call and schedule towards her completing the medication. So that it is scheduled within the 12 weeks per Dr. Berton Lan.

## 2023-05-28 ENCOUNTER — Ambulatory Visit (INDEPENDENT_AMBULATORY_CARE_PROVIDER_SITE_OTHER): Payer: MEDICAID | Admitting: Otolaryngology

## 2023-06-01 ENCOUNTER — Other Ambulatory Visit: Payer: Self-pay

## 2023-06-13 ENCOUNTER — Other Ambulatory Visit: Payer: Self-pay | Admitting: Family

## 2023-06-13 DIAGNOSIS — J4540 Moderate persistent asthma, uncomplicated: Secondary | ICD-10-CM

## 2023-06-30 ENCOUNTER — Ambulatory Visit (INDEPENDENT_AMBULATORY_CARE_PROVIDER_SITE_OTHER): Payer: MEDICAID | Admitting: Otolaryngology

## 2023-06-30 ENCOUNTER — Encounter (INDEPENDENT_AMBULATORY_CARE_PROVIDER_SITE_OTHER): Payer: Self-pay | Admitting: Otolaryngology

## 2023-06-30 VITALS — BP 142/97 | HR 76

## 2023-06-30 DIAGNOSIS — E041 Nontoxic single thyroid nodule: Secondary | ICD-10-CM

## 2023-06-30 DIAGNOSIS — L910 Hypertrophic scar: Secondary | ICD-10-CM | POA: Diagnosis not present

## 2023-06-30 DIAGNOSIS — Z9889 Other specified postprocedural states: Secondary | ICD-10-CM

## 2023-06-30 NOTE — Progress Notes (Unsigned)
 ENT Progress Note:   Update 06/30/2023  Discussed the use of AI scribe software for clinical note transcription with the patient, who gave verbal consent to proceed.  History of Present Illness   The patient is a 33 year old with hypothyroidism who presents with concerns about keloid formation at the tracheostomy stoma site. She had a tracheostomy after developing retropharyngeal abscess requiring I&D x 2. Decannulated 02/25/2023.  Approximately one month after the removal of her tracheostomy, she noticed a keloid forming at the site. The keloid has become more prominent over time. She has a history of keloid formation on other parts of her body, except her face, and has previously used natural remedies like cold press castor oil and dry brushing to manage them. She is concerned about potential internal scar tissue affecting her throat and describes a sensation of thickening in the area of the old stoma, though she does not experience significant pain or discomfort. She avoids touching or scratching the keloid to prevent exacerbation.  A recent thyroid ultrasound revealed a small thyroid nodule, too small to require bx or surveillance.   Records Reviewed:  Initial Evaluation  Reason for Consult: hospital f/u s/p trach and retropharyngeal/parapharyngeal abscess I&D x 2  HPI: Cheryl Tran is an 33 y.o. female with hx of asthma and depression patient who presented to the ER few days after undergoing right maxillary and mandibular molar extraction by an oral surgeon with facial and throat swelling trouble with secretion tolerance and dysphagia, with imaging notable for right parapharyngeal abscess and swelling along the pharyngeal wall on flexible laryngoscopy.  She was urgently taken to the operating room for tracheostomy and I&D of the parapharyngeal abscess and was admitted for observation but unfortunately left AMA postop day 1.  She was then encouraged to present to the ER again and was  readmitted for IV antibiotics and observation, and was found to have persistent and worsening white count concerning for recollection of the abscess.  Repeat imaging demonstrated persistent collection and she was taken to the operating room with Dr. Allena Katz 02/12/2023 for repeat I&D of right pharyngeal abscess, and improved further following the procedure on IV antibiotics.  She was transitioned to p.o. Augmentin per ID recommendations and was discharged with tracheostomy 02/15/23 and speaking valve.  She reports that she is able to phonate and eating regular diet without issues since her discharge from the hospital.  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  Here for outpatient follow-up with me and possible decannulation.  Denies fevers chills, denies facial or oral swelling, denies shortness of breath or trouble with swallowing.      Past Medical History:  Diagnosis Date   Airway obstruction 02/08/2023   Anxiety    Asthma    Bipolar 1 disorder (HCC)    Critical airway 02/06/2023   Depression    Pharyngeal abscess 02/12/2023   Retropharyngeal abscess 02/06/2023   Retropharyngeal and parapharyngeal abscess 02/07/2023   Tracheostomy in place [facility]) 02/06/2023    Past Surgical History:  Procedure Laterality Date   INCISION AND DRAINAGE ABSCESS N/A 02/12/2023   Procedure: INCISION AND DRAINAGE PHARYNGEAL ABSCESS;  Surgeon: Read Drivers, MD;  Location: [facility] OR;  Service: ENT;  Laterality: N/A;   KNEE ARTHROSCOPY WITH ANTERIOR CRUCIATE LIGAMENT (ACL) REPAIR Right 08/18/2022   Procedure: RIGHT KNEE ARTHROSCOPY WITH ANTERIOR CRUCIATE LIGAMENT (ACL) REPAIR;  Surgeon: Huel Cote, MD;  Location: [facility];  Service: Orthopedics;  Laterality:  Right;   KNEE ARTHROSCOPY WITH MACI CARTILAGE HARVEST Right 06/22/2022   Procedure: RIGHT KNEE ARTHROSCOPY PATELLA CHONDROPLASTY  WITH MACI CARTILAGE HARVEST;  Surgeon: Huel Cote, MD;  Location: [facility];  Service: Orthopedics;  Laterality: Right;   OSTEOCHONDRAL DEFECT REPAIR/RECONSTRUCTION Right 08/18/2022   Procedure: OSTEOCHONDRAL DEFECT REPAIR/RECONSTRUCTION/MACI IMPLANTATION;  Surgeon: Huel Cote, MD;  Location: [facility];  Service: Orthopedics;  Laterality: Right;   TONSILLECTOMY AND ADENOIDECTOMY N/A 02/06/2023   Procedure: TRANSORAL DRAINAGE ABSCESS;  Surgeon: Ashok Croon, MD;  Location: [facility] OR;  Service: ENT;  Laterality: N/A;   TRACHEOSTOMY TUBE PLACEMENT N/A 02/06/2023   Procedure: FIBEROPTIC INTUBATION WITH TRACHEOSTOMY;  Surgeon: Ashok Croon, MD;  Location: [facility] OR;  Service: ENT;  Laterality: N/A;    Family History  Problem Relation Age of Onset   Neuropathy Mother    Addison's disease Mother    Hypertension Mother    Pernicious anemia Mother    Hypertension Father    Diabetes Father    Graves' disease Sister    Bipolar disorder Sister    Breast cancer Maternal Grandmother    Hyperthyroidism Maternal Grandmother     Social History:  reports that she has been smoking cigarettes and cigars. She has never used smokeless tobacco. She reports current alcohol use. She reports current drug use. Drug: Marijuana.  Allergies: No Known Allergies  Medications: I have reviewed the patient's current medications.  The PMH, PSH, Medications, Allergies, and SH were reviewed and updated.  ROS: Constitutional: Negative for fever, weight loss and weight gain. Cardiovascular: Negative for chest pain and dyspnea on exertion. Respiratory: Is not experiencing shortness of breath at rest. Gastrointestinal: Negative for nausea and vomiting. Neurological: Negative for headaches. Psychiatric: The patient is not nervous/anxious  Blood pressure (!) 142/97, pulse 76, SpO2 99%.  PHYSICAL EXAM:  Exam: General: Well-developed, well-nourished Communication and Voice: Able to phonate well with speaking valve in place Respiratory Respiratory effort: Equal  inspiration and expiration without stridor Cardiovascular Peripheral Vascular: Warm extremities with equal color/perfusion Eyes: No nystagmus with equal extraocular motion bilaterally Neuro/Psych/Balance: Patient oriented to person, place, and time; Appropriate mood and affect; Gait is intact with no imbalance; Cranial nerves I-XII are intact Head and Face Inspection: Normocephalic and atraumatic without mass or lesion Facial Strength: Facial motility symmetric and full bilaterally ENT Pinna: External ear intact and fully developed External canal: Canal is patent with intact skin Tympanic Membrane: Clear and mobile External Nose: No scar or anatomic deformity  Bilateral inferior turbinate hypertrophy.  Lips, Teeth, and gums: Mucosa and teeth intact and viable Oral cavity/oropharynx: No erythema or exudate, no lesions present 2+ tonsils  Neck Neck and Trachea: Midline trachea without mass or lesion stomal incision well healed with hypertrophic scar but no evidence of Keloid Thyroid: No mass or nodularity Lymphatics: No lymphadenopathy  Studies Reviewed: CT soft tissue neck 02/12/2023 IMPRESSION: 1. The right masticator space/peritonsillar abscess is decreased in size but not resolved since the study from 02/06/2023 following incision and drainage, with persistent surrounding mucosal edema and near-complete effacement of the oropharyngeal airway. 2. Decreased reactive cervical lymphadenopathy. 3. Extensive soft tissue gas throughout the neck extending into the upper mediastinum and anterior chest wall. This may be iatrogenic following incision and drainage or related to positive pressure ventilation. 4. New patchy opacities in both imaged lungs, likely infectious/inflammatory.  Thyroid U/S 05/07/23 IMPRESSION: 1. Normal-sized and appearing thyroid without worrisome nodule or mass. 2. Solitary punctate (0.5 cm) right-sided thyroid nodule does not meet  criteria to recommend  percutaneous sampling or continued dedicated follow-up.  Assessment/Plan: Encounter Diagnoses  Name Primary?   Hypertrophic scar of skin Yes   History of tracheostomy    Thyroid nodule      Right masticator space parapharyngeal peritonsillar infection with an abscess requiring incision and drainage x 2 and tracheostomy due to upper airway narrowing, with hospital admission for IV antibiotics and monitoring, doing very well, on p.o. Augmentin per ID and still has 2 weeks left -Flexible laryngoscopy today with evidence of resolved bulging of the right parapharyngeal space and complete resolution of peritonsillar edema right pharyngeal wall edema and swelling -Tracheoscopy with patent airway and no evidence of subglottic scar -Patient was decannulated today with occlusive dressing applied over the stoma -instructed to keep pressure on the stoma with formation to optimize healing and closure -We discussed that most stomal openings will close and heal without interventions, and we will see the patient for follow-up in 2-3 months -We discussed that 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 She expresses concern about potential internal scar tissue but is reassured about her exam. We injected approximately 0.5 ml of Kenalog 40 mg/ml along the site of the stoma scar today.  We discussed that steroid injections are a standard treatment for hypertrophic scar. She elected to try it. We also discussed scar revision but she would like to hold off at this time.  - Schedule follow-up in two months to assess response to the steroid injection.   Thyroid nodule A small  thyroid nodule identified on ultrasound, and I reassured the patient that it is unrelated to the tracheostomy procedure. The nodule is not currently concerning for malignancy, and no biopsy/repeat thyroid U/S are indicated.\ - Monitor for now         Ashok Croon, MD Otolaryngology [facility] Phone: [phone] Fax: [phone]    07/01/2023, 2:43 AM

## 2023-07-30 ENCOUNTER — Ambulatory Visit (INDEPENDENT_AMBULATORY_CARE_PROVIDER_SITE_OTHER): Payer: MEDICAID | Admitting: Otolaryngology

## 2023-09-23 ENCOUNTER — Other Ambulatory Visit: Payer: Self-pay | Admitting: Family

## 2023-09-23 ENCOUNTER — Other Ambulatory Visit: Payer: Self-pay

## 2023-09-23 DIAGNOSIS — E282 Polycystic ovarian syndrome: Secondary | ICD-10-CM

## 2023-09-23 MED ORDER — METFORMIN HCL 500 MG PO TABS: 500.0000 mg | ORAL_TABLET | Freq: Two times a day (BID) | ORAL | 1 refills | Status: DC

## 2023-10-01 ENCOUNTER — Ambulatory Visit: Payer: MEDICAID | Admitting: Family

## 2023-10-01 NOTE — Progress Notes (Deleted)
 Patient ID: Cheryl Tran, female    DOB: 05-05-1990, 33 y.o.   MRN: [id number]  No chief complaint on file.           Assessment & Plan:   Subjective:    Outpatient Medications Prior to Visit  Medication Sig Dispense Refill   albuterol  (PROVENTIL ) (2.5 MG/3ML) 0.083% nebulizer solution Take 3 mLs (2.5 mg total) by nebulization every 6 (six) hours as needed for wheezing or shortness of breath. 90 mL 5   albuterol  (VENTOLIN  HFA) 108 (90 Base) MCG/ACT inhaler INHALE 2 PUFFS BY MOUTH EVERY 6 HOURS AS NEEDED FOR WHEEZING OR SHORTNESS OF BREATH 18 g 5   gabapentin  (NEURONTIN ) 300 MG capsule Take 1 capsule (300 mg total) by mouth 3 (three) times daily. 90 capsule 3   ibuprofen  (ADVIL ) 800 MG tablet Take 800 mg by mouth daily as needed for headache or moderate pain (pain score 4-6).     metFORMIN  (GLUCOPHAGE ) 500 MG tablet TAKE 1 TABLET(500 MG) BY MOUTH TWICE DAILY WITH A MEAL 180 tablet 1   metFORMIN  (GLUCOPHAGE ) 500 MG tablet Take 1 tablet (500 mg total) by mouth 2 (two) times daily with a meal. 180 tablet 1   Oxcarbazepine  (TRILEPTAL ) 300 MG tablet Take 1 tablet (300 mg total) by mouth 2 (two) times daily. 60 tablet 1   prazosin  (MINIPRESS ) 1 MG capsule Take 1 capsule (1 mg total) by mouth at bedtime. 30 capsule 0   SYMBICORT  80-4.5 MCG/ACT inhaler Inhale 2 puffs into the lungs 2 (two) times daily. (Patient taking differently: Inhale 2 puffs into the lungs 2 (two) times daily as needed (wheezing, shortness of breath).) 10.2 g 5   No facility-administered medications prior to visit.   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   Past Surgical History:  Procedure Laterality Date   INCISION AND  DRAINAGE ABSCESS N/A 02/12/2023   Procedure: INCISION AND DRAINAGE PHARYNGEAL ABSCESS;  Surgeon: Evelina Hippo, MD;  Location: [facility] OR;  Service: ENT;  Laterality: N/A;   KNEE ARTHROSCOPY WITH ANTERIOR CRUCIATE LIGAMENT (ACL) REPAIR Right 08/18/2022   Procedure: RIGHT KNEE ARTHROSCOPY WITH ANTERIOR CRUCIATE LIGAMENT (ACL) REPAIR;  Surgeon: Wilhelmenia Harada, MD;  Location: [facility];  Service: Orthopedics;  Laterality: Right;   KNEE ARTHROSCOPY WITH MACI  CARTILAGE HARVEST Right 06/22/2022   Procedure: RIGHT KNEE ARTHROSCOPY PATELLA CHONDROPLASTY  WITH MACI  CARTILAGE HARVEST;  Surgeon: Wilhelmenia Harada, MD;  Location: [facility];  Service: Orthopedics;  Laterality: Right;   OSTEOCHONDRAL DEFECT REPAIR/RECONSTRUCTION Right 08/18/2022   Procedure: OSTEOCHONDRAL DEFECT REPAIR/RECONSTRUCTION/MACI  IMPLANTATION;  Surgeon: Wilhelmenia Harada, MD;  Location: [facility];  Service: Orthopedics;  Laterality: Right;   TONSILLECTOMY AND ADENOIDECTOMY N/A 02/06/2023   Procedure: TRANSORAL DRAINAGE ABSCESS;  Surgeon: Artice Last, MD;  Location: [facility] OR;  Service: ENT;  Laterality: N/A;   TRACHEOSTOMY TUBE PLACEMENT N/A 02/06/2023   Procedure: FIBEROPTIC INTUBATION WITH TRACHEOSTOMY;  Surgeon: Artice Last, MD;  Location: [facility] OR;  Service: ENT;  Laterality: N/A;   No Known Allergies    Objective:    Physical Exam Vitals and nursing note reviewed.  Constitutional:      Appearance: Normal appearance.   Cardiovascular:  Rate and Rhythm: Normal rate and regular rhythm.  Pulmonary:     Effort: Pulmonary effort is normal.     Breath sounds: Normal breath sounds.   Musculoskeletal:        General: Normal range of motion.   Skin:    General: Skin is warm and dry.   Neurological:     Mental Status: She is alert.   Psychiatric:        Mood and Affect: Mood normal.        Behavior: Behavior normal.    There were no vitals taken for this visit. Wt Readings  from Last 3 Encounters:  05/19/23 193 lb (87.5 kg)  04/26/23 197 lb 9.6 oz (89.6 kg)  02/25/23 196 lb (88.9 kg)       Versa Gore, NP

## 2023-11-24 IMAGING — CR DG KNEE COMPLETE 4+V*R*
4 series · 4 of 4 positions shown · non-contrast
Comparison: None.

CLINICAL DATA: Status post fall.

EXAM:
RIGHT KNEE - COMPLETE 4+ VIEW

[knee ap]
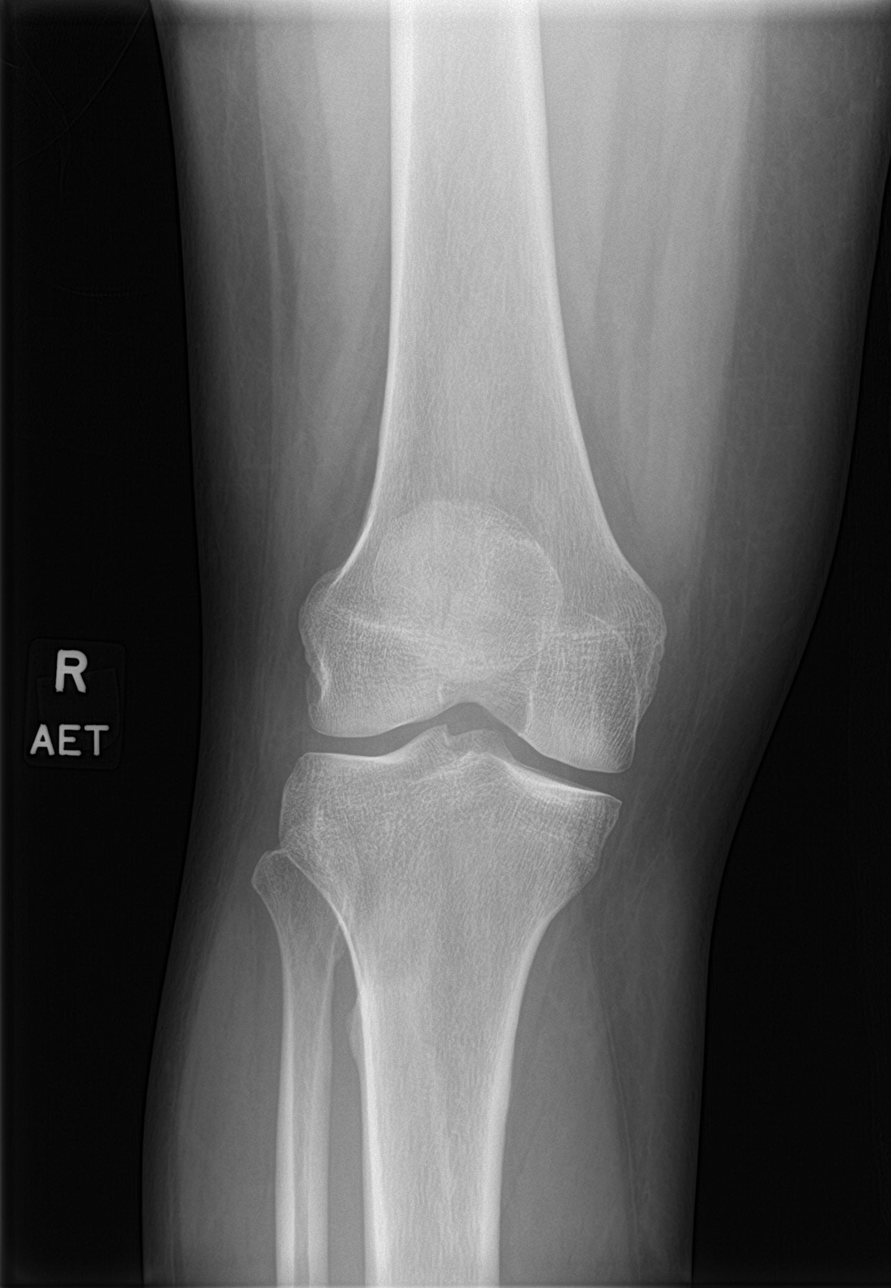

[knee lat]
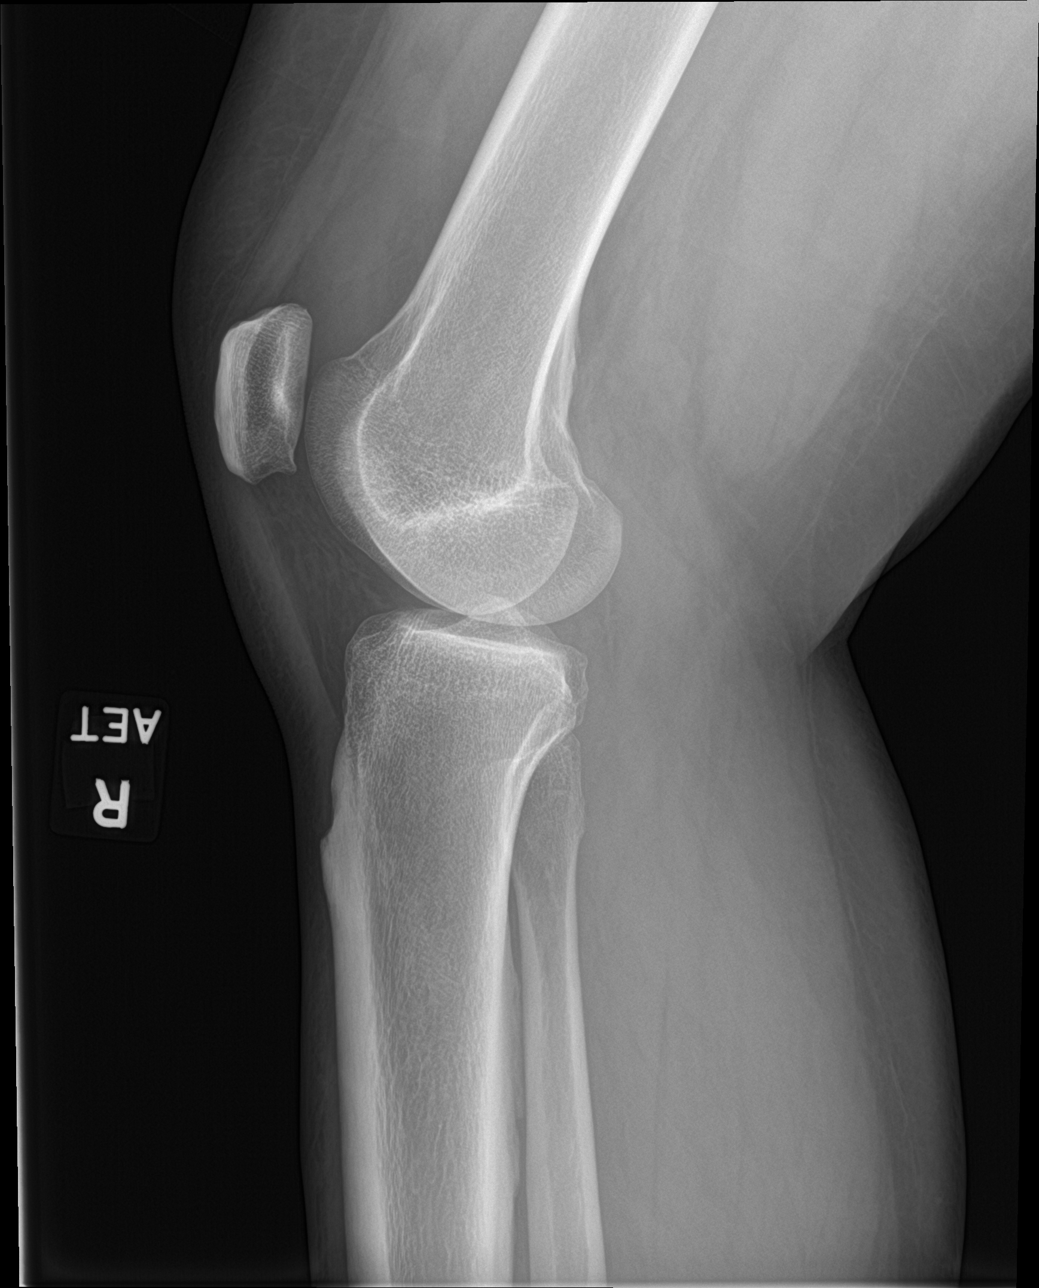

[knee obl (1 of 2)]
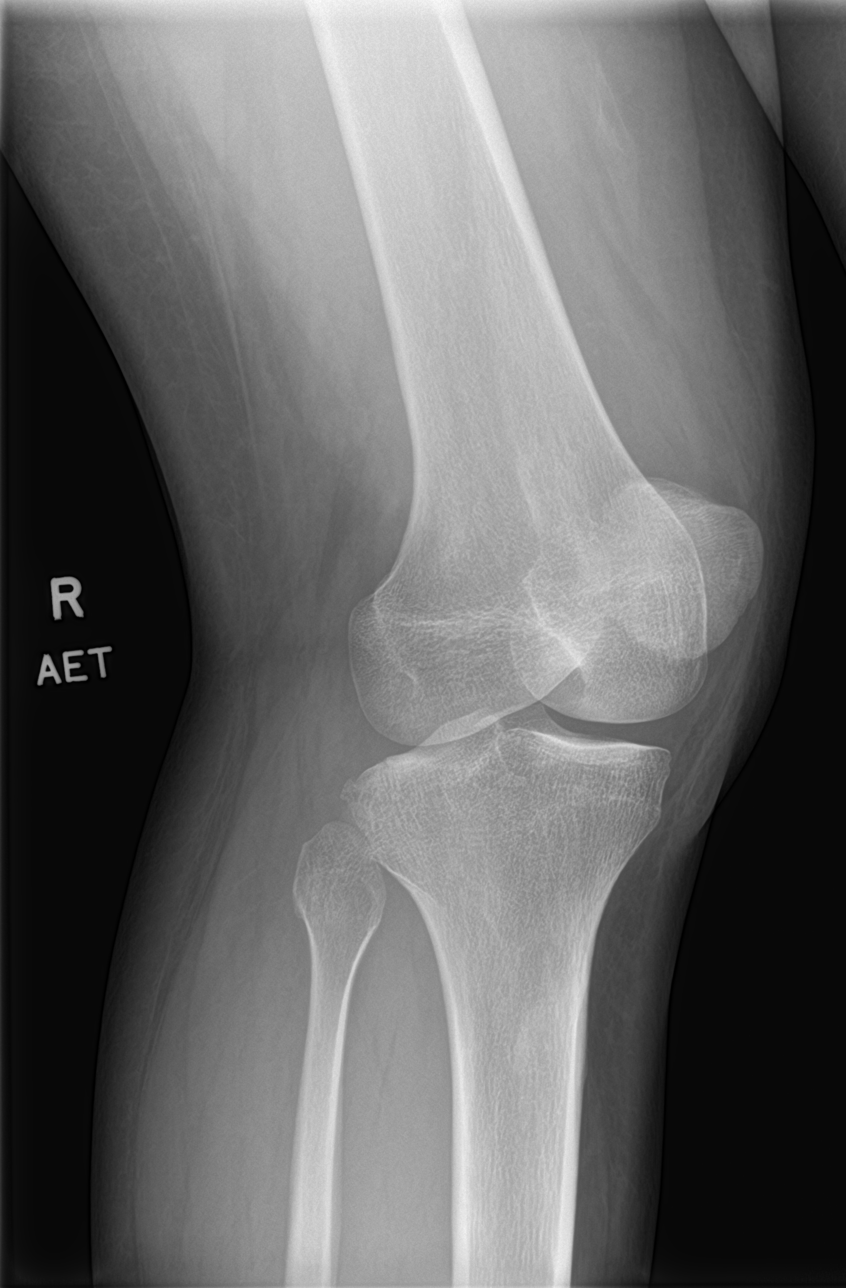

[knee obl (2 of 2)]
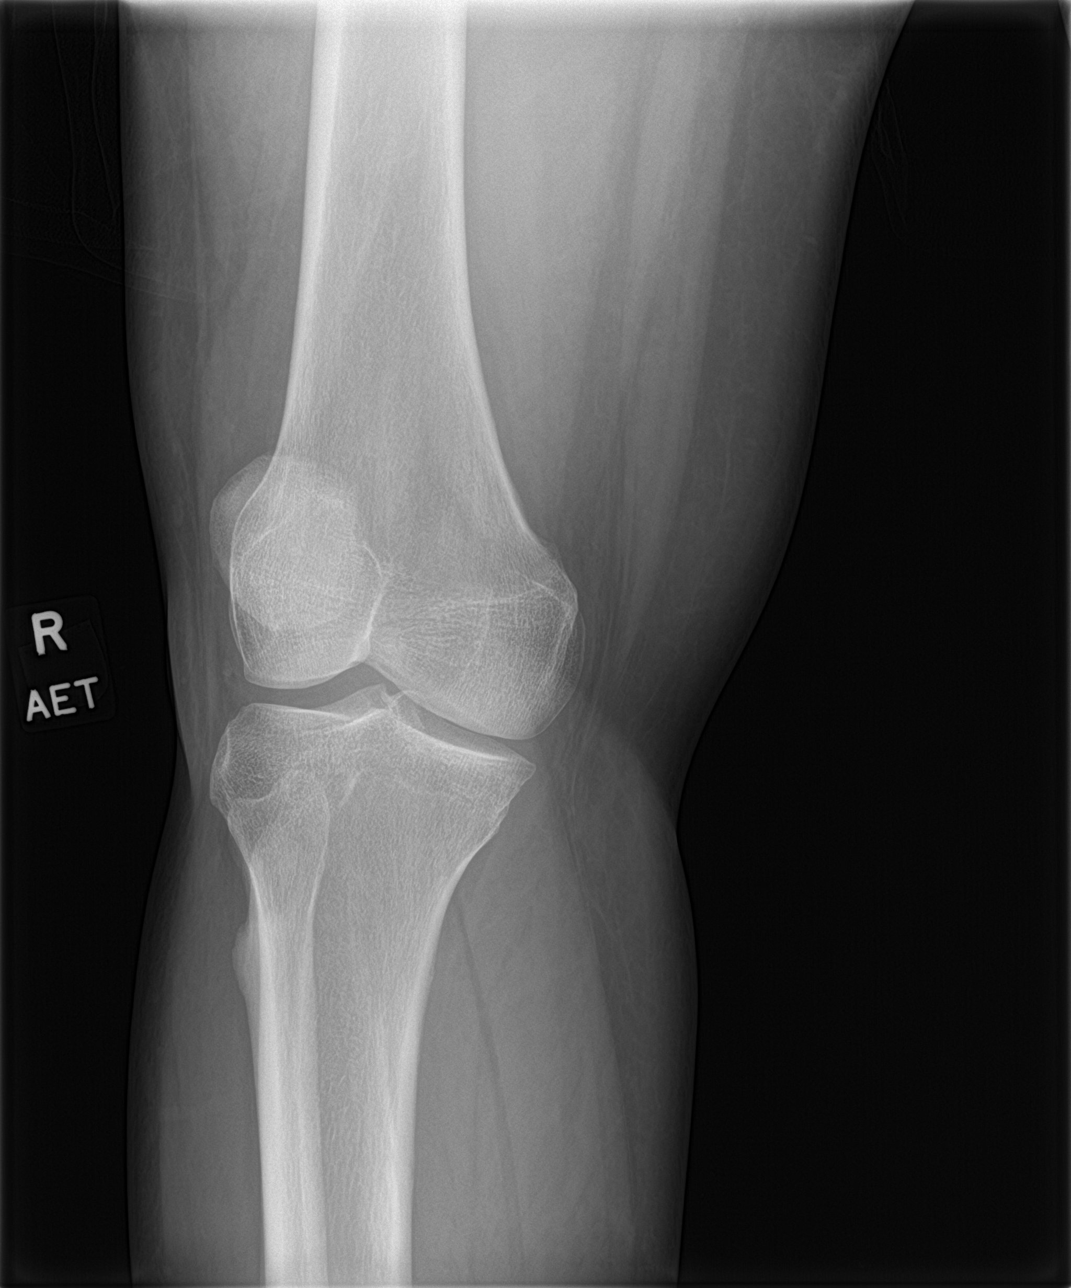

[4 of 4 positions shown; findings below may reference images not displayed]

FINDINGS: No evidence of an acute fracture or dislocation. No evidence of
arthropathy or other focal bone abnormality. A moderate sized joint
effusion is noted.
IMPRESSION: 1. No acute fracture or dislocation.
2. Moderate sized joint effusion.

## 2024-01-05 ENCOUNTER — Ambulatory Visit: Payer: MEDICAID | Admitting: Family

## 2024-01-07 ENCOUNTER — Telehealth: Payer: Self-pay

## 2024-01-07 ENCOUNTER — Ambulatory Visit: Payer: MEDICAID | Admitting: Family

## 2024-01-07 NOTE — Telephone Encounter (Signed)
 Please reschedule pt.   Thank you!  Copied from CRM #[id number]. Topic: General - Running Late >> Jan 07, 2024  4:12 PM Harlene ORN wrote: Darl[facility] - [employer]  Member was supposed to get a pick for her Gisele Jump, but the driver cancelled last minute. Patient is reschedule with another driver, but she will be running a little late. Wants to know when is the latest she can come in.  phone: [phone]

## 2024-01-24 ENCOUNTER — Encounter: Payer: Self-pay | Admitting: Family

## 2024-01-24 ENCOUNTER — Ambulatory Visit (INDEPENDENT_AMBULATORY_CARE_PROVIDER_SITE_OTHER): Payer: MEDICAID | Admitting: Family

## 2024-01-24 VITALS — BP 120/84 | HR 68 | Temp 98.4°F | Ht 68.0 in | Wt 194.2 lb

## 2024-01-24 DIAGNOSIS — F419 Anxiety disorder, unspecified: Secondary | ICD-10-CM | POA: Diagnosis not present

## 2024-01-24 DIAGNOSIS — K219 Gastro-esophageal reflux disease without esophagitis: Secondary | ICD-10-CM

## 2024-01-24 DIAGNOSIS — F32A Depression, unspecified: Secondary | ICD-10-CM

## 2024-01-24 DIAGNOSIS — E282 Polycystic ovarian syndrome: Secondary | ICD-10-CM

## 2024-01-24 DIAGNOSIS — F17200 Nicotine dependence, unspecified, uncomplicated: Secondary | ICD-10-CM | POA: Diagnosis not present

## 2024-01-24 MED ORDER — OMEPRAZOLE 20 MG PO CPDR: 20.0000 mg | DELAYED_RELEASE_CAPSULE | Freq: Every day | ORAL | 2 refills | Status: AC

## 2024-01-24 NOTE — Assessment & Plan Note (Signed)
 Metformin  effective in regulating menstrual cycles. - Continue metformin  500mg  twice daily.

## 2024-01-24 NOTE — Assessment & Plan Note (Signed)
 Recent onset of stomach discomfort with nausea. OTC treatments ineffective. Smoking may contribute. Differential includes acid reflux or gastritis. Not related to Metformin . - Prescribe omeprazole 20 mg twice daily for two weeks, then reduce to once daily. - Advised on dietary modifications to reduce acid intake, including avoiding caffeine, acidic foods, and greasy or fried foods. - Encourage complete cessation in smoking.  - F/U if above tx is not working

## 2024-01-24 NOTE — Assessment & Plan Note (Signed)
 Current medication regimen insufficient for symptom control. Applied for disability due to mental health impact on work. Is actively looking for another mental health provider. Currently seeing [facility] in [place]. - Provide a letter stating her mental health diagnoses and the impact on her ability to work as requested by her DSS case manager. - Continue lamotrigine, buspirone, and gabapentin  as directed. - Discuss potential for new medications or dose changes with her current or new mental health provider.

## 2024-01-24 NOTE — Progress Notes (Signed)
 Patient ID: Cheryl Tran, female    DOB: [birth year], 33 y.o.   MRN: [id number]  Chief Complaint  Patient presents with   GI Problem    Patient stated stomach issues started happening about 3 weeks ago. States it is every day. States Nausea, Diarrhea. No fever. Took OTC Omeprazole for 4 days w/o relief.   Mental Health Problem    Mental health told her to reach out to PCP for a letter for disability. Patient wants to discuss mental health process and she said in [place]  her Mental health Dr gave her information. Patient says she is confused on what to do next for applying for disability due to her mental health problems.  Discussed the use of AI scribe software for clinical note transcription with the patient, who gave verbal consent to proceed.  History of Present Illness   Cheryl Tran is a 33 year old female with anxiety, depression, bipolar disorder, and PTSD who presents with mental health and gastrointestinal symptoms.  She has been experiencing gastrointestinal discomfort for the past month, with stomach pain and occasional nausea. Over-the-counter Prilosec and Gas-X have not provided relief. She is concerned about the impact of metformin , taken twice daily for PCOS, on her symptoms. Her menstrual cycles have improved with metformin . She is not pregnant, confirmed by multiple tests, and has ceased sexual activity.  She is attending an intensive care therapy program and is on lamotrigine, buspirone, and gabapentin . She has experienced employment difficulties due to depression and anxiety, leading to job loss from missing work.   Socially, she smokes one black and mild per day and no longer uses marijuana. She engages in [place] activities, including live hosting and makeup tutorials, which she finds beneficial for her mental health.     Assessment and Plan    Mental health disorders (generalized anxiety disorder, major depressive disorder, bipolar disorder, borderline personality  disorder, post-traumatic stress disorder) Current medication regimen insufficient for symptom control. Applied for disability due to mental health impact on work. Is actively looking for another mental health provider. Currently seeing [facility] in [place]. - Provide a letter stating her mental health diagnoses and the impact on her ability to work as requested by her DSS case manager. - Continue lamotrigine, buspirone, and gabapentin  as directed. - Discuss potential for new medications or dose changes with her current or new mental health provider.  Dyspepsia Recent onset of stomach discomfort with nausea. OTC treatments ineffective. Smoking may contribute. Differential includes acid reflux or gastritis. Not related to Metformin . - Prescribe omeprazole 20 mg twice daily for two weeks, then reduce to once daily. - Advised on dietary modifications to reduce acid intake, including avoiding caffeine, acidic foods, and greasy or fried foods. - Encourage complete cessation in smoking.  - F/U if above tx is not working  Polycystic ovary syndrome (PCOS) Metformin  effective in regulating menstrual cycles. - Continue metformin  500mg  twice daily.  Tobacco use Significant reduction in smoking, now one black and mild per day or less. - Encourage complete cessation in smoking.   Subjective:    Outpatient Medications Prior to Visit  Medication Sig Dispense Refill   albuterol  (PROVENTIL ) (2.5 MG/3ML) 0.083% nebulizer solution Take 3 mLs (2.5 mg total) by nebulization every 6 (six) hours as needed for wheezing or shortness of breath. 90 mL 5   albuterol  (VENTOLIN  HFA) 108 (90 Base) MCG/ACT inhaler INHALE 2 PUFFS BY MOUTH EVERY 6 HOURS AS NEEDED FOR WHEEZING OR SHORTNESS OF BREATH 18 g 5  busPIRone (BUSPAR) 10 MG tablet Take 10 mg by mouth 3 (three) times daily.     gabapentin  (NEURONTIN ) 300 MG capsule Take 1 capsule (300 mg total) by mouth 3 (three) times daily. 90 capsule 3   lamoTRIgine (LAMICTAL)  25 MG tablet Take 25 mg by mouth daily.     metFORMIN  (GLUCOPHAGE ) 500 MG tablet TAKE 1 TABLET(500 MG) BY MOUTH TWICE DAILY WITH A MEAL 180 tablet 1   prazosin  (MINIPRESS ) 1 MG capsule Take 1 capsule (1 mg total) by mouth at bedtime. 30 capsule 0   SYMBICORT  80-4.5 MCG/ACT inhaler Inhale 2 puffs into the lungs 2 (two) times daily. (Patient taking differently: Inhale 2 puffs into the lungs 2 (two) times daily as needed (wheezing, shortness of breath).) 10.2 g 5   ibuprofen  (ADVIL ) 800 MG tablet Take 800 mg by mouth daily as needed for headache or moderate pain (pain score 4-6). (Patient not taking: Reported on 01/24/2024)     metFORMIN  (GLUCOPHAGE ) 500 MG tablet Take 1 tablet (500 mg total) by mouth 2 (two) times daily with a meal. 180 tablet 1   Oxcarbazepine  (TRILEPTAL ) 300 MG tablet Take 1 tablet (300 mg total) by mouth 2 (two) times daily. 60 tablet 1   No facility-administered medications prior to visit.   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   Past Surgical History:  Procedure Laterality Date   INCISION AND DRAINAGE ABSCESS N/A 02/12/2023   Procedure: INCISION AND DRAINAGE PHARYNGEAL ABSCESS;  Surgeon: Tobie Eldora NOVAK, MD;  Location: [facility] OR;  Service: ENT;  Laterality: N/A;   KNEE ARTHROSCOPY WITH ANTERIOR CRUCIATE LIGAMENT (ACL) REPAIR Right 08/18/2022   Procedure: RIGHT KNEE ARTHROSCOPY WITH ANTERIOR CRUCIATE LIGAMENT (ACL) REPAIR;  Surgeon: Genelle Standing, MD;  Location: [facility];  Service: Orthopedics;  Laterality: Right;   KNEE ARTHROSCOPY WITH MACI  CARTILAGE HARVEST Right 06/22/2022   Procedure: RIGHT KNEE ARTHROSCOPY PATELLA CHONDROPLASTY  WITH  MACI  CARTILAGE HARVEST;  Surgeon: Genelle Standing, MD;  Location: [facility];  Service: Orthopedics;  Laterality: Right;   OSTEOCHONDRAL DEFECT REPAIR/RECONSTRUCTION Right 08/18/2022   Procedure: OSTEOCHONDRAL DEFECT REPAIR/RECONSTRUCTION/MACI  IMPLANTATION;  Surgeon: Genelle Standing, MD;  Location: [facility];  Service: Orthopedics;  Laterality: Right;   TONSILLECTOMY AND ADENOIDECTOMY N/A 02/06/2023   Procedure: TRANSORAL DRAINAGE ABSCESS;  Surgeon: Okey Burns, MD;  Location: [facility] OR;  Service: ENT;  Laterality: N/A;   TRACHEOSTOMY TUBE PLACEMENT N/A 02/06/2023   Procedure: FIBEROPTIC INTUBATION WITH TRACHEOSTOMY;  Surgeon: Okey Burns, MD;  Location: [facility] OR;  Service: ENT;  Laterality: N/A;   No Known Allergies    Objective:    Physical Exam Vitals and nursing note reviewed.  Constitutional:      Appearance: Normal appearance.  Cardiovascular:     Rate and Rhythm: Normal rate and regular rhythm.  Pulmonary:     Effort: Pulmonary effort is normal.     Breath sounds: Normal breath sounds.  Musculoskeletal:        General: Normal range of motion.  Skin:    General: Skin is warm and dry.  Neurological:     Mental Status: She is alert.  Psychiatric:  Mood and Affect: Mood normal.        Behavior: Behavior normal.    BP 120/84   Pulse 68   Temp 98.4 F (36.9 C) (Oral)   Ht 5' 8 (1.727 m)   Wt 194 lb 3.2 oz (88.1 kg)   SpO2 98%   BMI 29.53 kg/m  Wt Readings from Last 3 Encounters:  01/24/24 194 lb 3.2 oz (88.1 kg)  05/19/23 193 lb (87.5 kg)  04/26/23 197 lb 9.6 oz (89.6 kg)       Lucius Krabbe, NP

## 2024-05-19 ENCOUNTER — Encounter: Payer: MEDICAID | Admitting: Family
